# Patient Record
Sex: Male | Born: 1945
Health system: Southern US, Community
[De-identification: ages and names within clinical notes are randomized; demographics above are authoritative.]

## PROBLEM LIST (undated history)

## (undated) DIAGNOSIS — H35419 Lattice degeneration of retina, unspecified eye: Secondary | ICD-10-CM

## (undated) DIAGNOSIS — R001 Bradycardia, unspecified: Secondary | ICD-10-CM

## (undated) DIAGNOSIS — I8390 Asymptomatic varicose veins of unspecified lower extremity: Secondary | ICD-10-CM

## (undated) DIAGNOSIS — M5030 Other cervical disc degeneration, unspecified cervical region: Secondary | ICD-10-CM

## (undated) DIAGNOSIS — K635 Polyp of colon: Secondary | ICD-10-CM

## (undated) DIAGNOSIS — R1310 Dysphagia, unspecified: Secondary | ICD-10-CM

## (undated) DIAGNOSIS — M5106 Intervertebral disc disorders with myelopathy, lumbar region: Secondary | ICD-10-CM

## (undated) DIAGNOSIS — I83893 Varicose veins of bilateral lower extremities with other complications: Secondary | ICD-10-CM

## (undated) DIAGNOSIS — J449 Chronic obstructive pulmonary disease, unspecified: Secondary | ICD-10-CM

## (undated) DIAGNOSIS — J4489 Other specified chronic obstructive pulmonary disease: Secondary | ICD-10-CM

## (undated) DIAGNOSIS — R300 Dysuria: Secondary | ICD-10-CM

## (undated) DIAGNOSIS — F4310 Post-traumatic stress disorder, unspecified: Secondary | ICD-10-CM

## (undated) DIAGNOSIS — M19049 Primary osteoarthritis, unspecified hand: Secondary | ICD-10-CM

## (undated) DIAGNOSIS — E785 Hyperlipidemia, unspecified: Secondary | ICD-10-CM

## (undated) DIAGNOSIS — Z972 Presence of dental prosthetic device (complete) (partial): Secondary | ICD-10-CM

## (undated) DIAGNOSIS — D126 Benign neoplasm of colon, unspecified: Secondary | ICD-10-CM

## (undated) HISTORY — DX: Lattice degeneration of retina, unspecified eye: H35.419

## (undated) HISTORY — DX: Chronic obstructive pulmonary disease, unspecified: J44.9

## (undated) HISTORY — PX: TONSILLECTOMY: SUR1361

## (undated) HISTORY — DX: Benign neoplasm of colon, unspecified: D12.6

## (undated) HISTORY — DX: Polyp of colon: K63.5

## (undated) HISTORY — DX: Other specified chronic obstructive pulmonary disease: J44.89

## (undated) HISTORY — DX: Primary osteoarthritis, unspecified hand: M19.049

## (undated) HISTORY — PX: ESOPHAGOGASTRODUODENOSCOPY: SHX1529

## (undated) HISTORY — PX: CYSTOSCOPY: SUR368

## (undated) HISTORY — DX: Hyperlipidemia, unspecified: E78.5

## (undated) HISTORY — DX: Other cervical disc degeneration, unspecified cervical region: M50.30

## (undated) HISTORY — PX: EYE SURGERY: SHX253

## (undated) HISTORY — DX: Post-traumatic stress disorder, unspecified: F43.10

## (undated) HISTORY — DX: Asymptomatic varicose veins of unspecified lower extremity: I83.90

## (undated) HISTORY — DX: Bradycardia, unspecified: R00.1

---

## 2012-09-05 HISTORY — PX: COLONOSCOPY: SHX174

## 2013-05-21 ENCOUNTER — Ambulatory Visit: Payer: Self-pay | Admitting: Unknown Physician Specialty

## 2013-05-31 ENCOUNTER — Ambulatory Visit: Payer: Self-pay | Admitting: Unknown Physician Specialty

## 2013-06-03 LAB — PATHOLOGY REPORT

## 2013-09-05 HISTORY — PX: CATARACT EXTRACTION: SUR2

## 2014-03-31 ENCOUNTER — Ambulatory Visit: Payer: Self-pay | Admitting: Physician Assistant

## 2014-04-09 ENCOUNTER — Ambulatory Visit: Payer: Self-pay | Admitting: Internal Medicine

## 2014-04-15 ENCOUNTER — Encounter: Payer: Self-pay | Admitting: *Deleted

## 2014-04-17 ENCOUNTER — Ambulatory Visit (INDEPENDENT_AMBULATORY_CARE_PROVIDER_SITE_OTHER): Payer: Commercial Managed Care - HMO | Admitting: Cardiovascular Disease

## 2014-04-17 ENCOUNTER — Encounter: Payer: Self-pay | Admitting: Cardiovascular Disease

## 2014-04-17 ENCOUNTER — Encounter (INDEPENDENT_AMBULATORY_CARE_PROVIDER_SITE_OTHER): Payer: Self-pay

## 2014-04-17 VITALS — BP 124/76 | HR 64 | Ht 71.5 in | Wt 170.0 lb

## 2014-04-17 DIAGNOSIS — R0602 Shortness of breath: Secondary | ICD-10-CM | POA: Diagnosis not present

## 2014-04-17 DIAGNOSIS — I498 Other specified cardiac arrhythmias: Secondary | ICD-10-CM

## 2014-04-17 DIAGNOSIS — R001 Bradycardia, unspecified: Secondary | ICD-10-CM

## 2014-04-17 HISTORY — DX: Bradycardia, unspecified: R00.1

## 2014-04-17 NOTE — Patient Instructions (Signed)
Follow up as needed

## 2014-04-17 NOTE — Assessment & Plan Note (Signed)
The patient reports prolonged history of sinus bradycardia but he has been completely asymptomatic. I reviewed his home blood pressure heart rate readings. The lowest heart rate was 44 beats per minute and the highest was 65 beats per minute. Even when his heart rate was 44 beats per minute, he did not have any symptoms. Thus, there is no indication for pacemaker placement. He otherwise has no symptoms suggestive of angina or heart failure. Right arm numbness is not related to cardiac etiology. He can followup with me as needed if he develops cardiac symptoms.

## 2014-04-17 NOTE — Progress Notes (Signed)
Primary care physician: Dr. Army Melia  HPI  This is a pleasant 68 year old man who was referred for evaluation of bradycardia. He has no previous cardiac history and no significant disc factors for coronary artery disease other than age and gender. He reports prolonged history of asymptomatic bradycardia. Recently, he had right arm numbness and went to urgent care. He was suspected of having neuropathy. He denies any chest discomfort or left arm discomfort. No shortness of breath. He exercises regularly on a treadmill for about 35 minutes with no reported exertional symptoms. He had carotid Doppler done recently and was told that there was no significant disease. He reports having slow heartbeats throughout his life but he never had syncope. He denies dizziness, presyncope or exercise intolerance. He is not a smoker and has no family history of premature coronary artery disease or arrhythmia.  No Known Allergies   Current Outpatient Prescriptions on File Prior to Visit  Medication Sig Dispense Refill  . buPROPion (WELLBUTRIN) 100 MG tablet Take 100 mg by mouth 4 (four) times daily as needed.      . traZODone (DESYREL) 50 MG tablet Take 50 mg by mouth at bedtime.       No current facility-administered medications on file prior to visit.     Past Medical History  Diagnosis Date  . Posttraumatic stress disorder   . Degeneration of cervical intervertebral disc   . Lattice degeneration of peripheral retina   . Osteoarthrosis, unspecified whether generalized or localized, hand   . Benign neoplasm of colon   . Chronic airway obstruction, not elsewhere classified   . COPD (chronic obstructive pulmonary disease)      Past Surgical History  Procedure Laterality Date  . Tonsillectomy    . Colonoscopy    . Cystoscopy       Family History  Problem Relation Age of Onset  . Hypertension Mother      History   Social History  . Marital Status: Single    Spouse Name: N/A    Number of  Children: N/A  . Years of Education: N/A   Occupational History  . Not on file.   Social History Main Topics  . Smoking status: Never Smoker   . Smokeless tobacco: Not on file  . Alcohol Use: Yes     Comment: socially  . Drug Use: No  . Sexual Activity: Not on file   Other Topics Concern  . Not on file   Social History Narrative  . No narrative on file     ROS A 10 point review of system was performed. It is negative other than that mentioned in the history of present illness.   PHYSICAL EXAM   BP 124/76  Pulse 64  Ht 5' 11.5" (1.816 m)  Wt 170 lb (77.111 kg)  BMI 23.38 kg/m2 Constitutional: He is oriented to person, place, and time. He appears well-developed and well-nourished. No distress.  HENT: No nasal discharge.  Head: Normocephalic and atraumatic.  Eyes: Pupils are equal and round.  No discharge. Neck: Normal range of motion. Neck supple. No JVD present. No thyromegaly present.  Cardiovascular: Normal rate, regular rhythm, normal heart sounds. Exam reveals no gallop and no friction rub. No murmur heard.  Pulmonary/Chest: Effort normal and breath sounds normal. No stridor. No respiratory distress. He has no wheezes. He has no rales. He exhibits no tenderness.  Abdominal: Soft. Bowel sounds are normal. He exhibits no distension. There is no tenderness. There is no rebound and no guarding.  Musculoskeletal: Normal range of motion. He exhibits no edema and no tenderness.  Neurological: He is alert and oriented to person, place, and time. Coordination normal.  Skin: Skin is warm and dry. No rash noted. He is not diaphoretic. No erythema. No pallor.  Psychiatric: He has a normal mood and affect. His behavior is normal. Judgment and thought content normal.       EKG: Recent EKG showed sinus bradycardia with no significant AV block.   ASSESSMENT AND PLAN

## 2014-05-14 ENCOUNTER — Ambulatory Visit: Payer: Self-pay | Admitting: Ophthalmology

## 2014-09-05 HISTORY — PX: CATARACT EXTRACTION, BILATERAL: SHX1313

## 2014-10-23 ENCOUNTER — Ambulatory Visit: Payer: Self-pay | Admitting: Internal Medicine

## 2015-03-10 ENCOUNTER — Encounter: Payer: Self-pay | Admitting: Internal Medicine

## 2015-03-10 DIAGNOSIS — IMO0002 Reserved for concepts with insufficient information to code with codable children: Secondary | ICD-10-CM | POA: Insufficient documentation

## 2015-03-10 DIAGNOSIS — I8390 Asymptomatic varicose veins of unspecified lower extremity: Secondary | ICD-10-CM

## 2015-03-10 DIAGNOSIS — H35419 Lattice degeneration of retina, unspecified eye: Secondary | ICD-10-CM | POA: Insufficient documentation

## 2015-03-10 DIAGNOSIS — N138 Other obstructive and reflux uropathy: Secondary | ICD-10-CM | POA: Insufficient documentation

## 2015-03-10 DIAGNOSIS — N401 Enlarged prostate with lower urinary tract symptoms: Secondary | ICD-10-CM | POA: Insufficient documentation

## 2015-03-10 DIAGNOSIS — R300 Dysuria: Secondary | ICD-10-CM | POA: Insufficient documentation

## 2015-03-10 DIAGNOSIS — R001 Bradycardia, unspecified: Secondary | ICD-10-CM | POA: Insufficient documentation

## 2015-03-10 DIAGNOSIS — M25569 Pain in unspecified knee: Secondary | ICD-10-CM | POA: Insufficient documentation

## 2015-03-10 DIAGNOSIS — M19049 Primary osteoarthritis, unspecified hand: Secondary | ICD-10-CM | POA: Insufficient documentation

## 2015-03-10 DIAGNOSIS — F4310 Post-traumatic stress disorder, unspecified: Secondary | ICD-10-CM | POA: Insufficient documentation

## 2015-03-10 DIAGNOSIS — M5030 Other cervical disc degeneration, unspecified cervical region: Secondary | ICD-10-CM | POA: Insufficient documentation

## 2015-03-10 HISTORY — DX: Asymptomatic varicose veins of unspecified lower extremity: I83.90

## 2015-03-11 ENCOUNTER — Ambulatory Visit (INDEPENDENT_AMBULATORY_CARE_PROVIDER_SITE_OTHER): Payer: PPO | Admitting: Internal Medicine

## 2015-03-11 ENCOUNTER — Encounter: Payer: Self-pay | Admitting: Internal Medicine

## 2015-03-11 VITALS — BP 128/60 | HR 60 | Ht 71.5 in | Wt 174.6 lb

## 2015-03-11 DIAGNOSIS — H81393 Other peripheral vertigo, bilateral: Secondary | ICD-10-CM

## 2015-03-11 DIAGNOSIS — M5116 Intervertebral disc disorders with radiculopathy, lumbar region: Secondary | ICD-10-CM | POA: Insufficient documentation

## 2015-03-11 DIAGNOSIS — H8103 Meniere's disease, bilateral: Secondary | ICD-10-CM

## 2015-03-11 DIAGNOSIS — F17201 Nicotine dependence, unspecified, in remission: Secondary | ICD-10-CM | POA: Insufficient documentation

## 2015-03-11 NOTE — Progress Notes (Signed)
Date:  03/11/2015   Name:  Timothy Timothy   DOB:  1946-01-02   MRN:  [id number]   Chief Complaint: Dizziness Dizziness This is a new problem. The current episode started 1 to 4 weeks ago. The problem occurs intermittently (he has had no symptoms for the past three days). The problem has been waxing and waning. Associated symptoms include congestion. Pertinent negatives include no chest pain, coughing, fatigue, fever, headaches, swollen glands or urinary symptoms. The symptoms are aggravated by standing and twisting. He has tried drinking for the symptoms.  Back Pain This is a chronic problem. The current episode started more than 1 month ago. The problem occurs constantly. The problem is unchanged. The pain is present in the lumbar spine. The quality of the pain is described as aching and cramping. The pain radiates to the left thigh and left foot. Pertinent negatives include no chest pain, fever or headaches. Treatments tried: [facility]o at [facility] - MRI with disc disease.  Started on gabapentin without much improvement in discomfort.  Now discussing possible ESI.     Review of Systems:  Review of Systems  Constitutional: Negative for fever and fatigue.  HENT: Positive for congestion. Negative for ear pain, hearing loss, nosebleeds, rhinorrhea and sinus pressure.   Eyes: Negative for visual disturbance.  Respiratory: Negative for cough and shortness of breath.   Cardiovascular: Negative for chest pain and leg swelling.  Musculoskeletal: Positive for back pain.  Neurological: Positive for dizziness and light-headedness. Negative for headaches.    Patient Active Problem List   Diagnosis Date Noted  . Tobacco use disorder, moderate, in sustained remission 03/11/2015  . Gonalgia 03/10/2015  . Bradycardia, sinus 03/10/2015  . DDD (degenerative disc disease), cervical 03/10/2015  . Difficult or painful urination 03/10/2015  . COPD, mild 03/10/2015  . Degenerative arthritis of finger 03/10/2015   . Neurosis, posttraumatic 03/10/2015  . Lattice degeneration 03/10/2015  . Leg varices 03/10/2015  . Sinus bradycardia 04/17/2014    Prior to Admission medications   Medication Sig Start Date End Date Taking? Authorizing Provider  finasteride (PROSCAR) 5 MG tablet Take 5 mg by mouth daily.   Yes Historical Provider, MD  gabapentin (NEURONTIN) 300 MG capsule Take 300 mg by mouth 3 (three) times daily.   Yes Historical Provider, MD  traZODone (DESYREL) 50 MG tablet Take 50 mg by mouth at bedtime.   Yes Historical Provider, MD    No Known Allergies  Past Surgical History  Procedure Laterality Date  . Tonsillectomy    . Colonoscopy    . Cystoscopy    . Cataract extraction Right 2015  . Colonoscopy  2014    benign polyps    History  Substance Use Topics  . Smoking status: Former Research scientist (life sciences)  . Smokeless tobacco: Not on file  . Alcohol Use: 8.4 oz/week    14 Standard drinks or equivalent per week     Comment: socially     Medication list has been reviewed and updated.  Physical Examination:  Physical Exam  Constitutional: He is oriented to person, place, and time. He appears well-developed and well-nourished. No distress.  HENT:  Head: Normocephalic.  Right Ear: Tympanic membrane and ear canal normal.  Left Ear: Tympanic membrane and ear canal normal.  Nose: Nose normal. Right sinus exhibits no maxillary sinus tenderness and no frontal sinus tenderness. Left sinus exhibits no maxillary sinus tenderness and no frontal sinus tenderness.  Mouth/Throat: Uvula is midline and oropharynx is clear and moist. No posterior oropharyngeal  erythema.  Eyes: Conjunctivae are normal. Right eye exhibits nystagmus. Left eye exhibits nystagmus. Right pupil is round and reactive. Left pupil is round and reactive. Pupils are equal.  Very slight nystagmus to lateral gaze in both directions with mild vertigo.  Neck: Carotid bruit is not present. No thyromegaly present.  Cardiovascular: Normal rate,  regular rhythm and S1 normal.   Pulmonary/Chest: Effort normal and breath sounds normal.  Neurological: He is alert and oriented to person, place, and time. He has normal reflexes. No cranial nerve deficit or sensory deficit. Gait normal.    BP 128/60 mmHg  Pulse 60  Ht 5' 11.5" (1.816 m)  Wt 174 lb 9.6 oz (79.198 kg)  BMI 24.01 kg/m2  Assessment and Plan: 1. Lumbar disc herniation with radiculopathy Continue gabapentin but consider dose increase if no improvement Also consider ESI if symptoms persist  2. Labyrinthine vertigo with involvement of both inner ears Patient reassured - should resolve without treatment over the next few weeks If worsening, will refer to ENT   Halina Maidens, MD [facility]  03/11/2015

## 2015-03-11 NOTE — Patient Instructions (Signed)
Labyrinthitis (Inner Ear Inflammation) Your exam shows you have an inner ear disturbance or labyrinthitis. The cause of this condition is not known. But it may be due to a virus infection. The symptoms of labyrinthitis include vertigo or dizziness made worse by motion, nausea and vomiting. The onset of labyrinthitis may be very sudden. It usually lasts for a few days and then clears up over 1-2 weeks. The treatment of an inner ear disturbance includes bed rest and medications to reduce dizziness, nausea, and vomiting. You should stay away from alcohol, tranquilizers, caffeine, nicotine, or any medicine your doctor thinks may make your symptoms worse. Further testing may be needed to evaluate your hearing and balance system. Please see your doctor or go to the emergency room right away if you have:  Increasing vertigo, earache, loss of hearing, or ear drainage.  Headache, blurred vision, trouble walking, fainting, or fever.  Persistent vomiting, dehydration, or extreme weakness. Document Released: 08/22/2005 Document Revised: 11/14/2011 Document Reviewed: 02/07/2007 [facility] Patient Information 2015 [place], [place]. This information is not intended to replace advice given to you by your health care provider. Make sure you discuss any questions you have with your health care provider.

## 2015-05-28 ENCOUNTER — Ambulatory Visit (INDEPENDENT_AMBULATORY_CARE_PROVIDER_SITE_OTHER): Payer: PPO | Admitting: Internal Medicine

## 2015-05-28 ENCOUNTER — Encounter: Payer: Self-pay | Admitting: Internal Medicine

## 2015-05-28 VITALS — BP 110/60 | HR 64 | Ht 71.5 in | Wt 173.8 lb

## 2015-05-28 DIAGNOSIS — Z0000 Encounter for general adult medical examination without abnormal findings: Secondary | ICD-10-CM

## 2015-05-28 DIAGNOSIS — Z720 Tobacco use: Secondary | ICD-10-CM | POA: Diagnosis not present

## 2015-05-28 DIAGNOSIS — F4310 Post-traumatic stress disorder, unspecified: Secondary | ICD-10-CM

## 2015-05-28 DIAGNOSIS — M5116 Intervertebral disc disorders with radiculopathy, lumbar region: Secondary | ICD-10-CM

## 2015-05-28 DIAGNOSIS — F17201 Nicotine dependence, unspecified, in remission: Secondary | ICD-10-CM

## 2015-05-28 DIAGNOSIS — M25561 Pain in right knee: Secondary | ICD-10-CM | POA: Diagnosis not present

## 2015-05-28 DIAGNOSIS — Z23 Encounter for immunization: Secondary | ICD-10-CM

## 2015-05-28 NOTE — Patient Instructions (Addendum)
Pneumococcal Conjugate Vaccine: What You Need to Know Your doctor recommends that you, or your child, get a dose of PCV13 today. 1. Why get vaccinated? Pneumococcal conjugate vaccine (called PCV13 or Prevnar 13) is recommended to protect infants and toddlers, and some older children and adults with certain health conditions, from pneumococcal disease. Pneumococcal disease is caused by infection with Streptococcus pneumoniae bacteria. These bacteria can spread from person to person through close contact. Pneumococcal disease can lead to severe health problems, including pneumonia, blood infections, and meningitis. Meningitis is an infection of the covering of the brain. Pneumococcal meningitis is fairly rare (less than 1 case per 100,000 people each year), but it leads to other health problems, including deafness and brain damage. In children, it is fatal in about 1 case out of 10. Children younger than two are at higher risk for serious disease than older children. People with certain medical conditions, people over age 66, and cigarette smokers are also at higher risk. Before vaccine, pneumococcal infections caused many problems each year in the [place] in children younger than 5, including:  more than 700 cases of meningitis,  13,000 blood infections,  about 5 million ear infections, and  about 200 deaths. About 4,000 adults still die each year because of pneumococcal infections. Pneumococcal infections can be hard to treat because some strains are resistant to antibiotics. This makes prevention through vaccination even more important. 2. PCV13 vaccine There are more than 90 types of pneumococcal bacteria. PCV13 protects against 13 of them. These 13 strains cause most severe infections in children and about half of infections in adults.  PCV13 is routinely given to children at 2, 4, 6, and 2-73 months of age. Children in this age range are at greatest risk for serious diseases caused  by pneumococcal infection. PCV13 vaccine may also be recommended for some older children or adults. Your doctor can give you details. A second type of pneumococcal vaccine, called PPSV23, may also be given to some children and adults, including anyone over age 9. There is a separate Vaccine Information Statement for this vaccine. 3. Precautions  Anyone who has ever had a life-threatening allergic reaction to a dose of this vaccine, to an earlier pneumococcal vaccine called PCV7 (or Prevnar), or to any vaccine containing diphtheria toxoid (for example, DTaP), should not get PCV13. Anyone with a severe allergy to any component of PCV13 should not get the vaccine. Tell your doctor if the person being vaccinated has any severe allergies. If the person scheduled for vaccination is sick, your doctor might decide to reschedule the shot on another day. Your doctor can give you more information about any of these precautions. 4. What are the risks of PCV13 vaccine?  With any medicine, including vaccines, there is a chance of side effects. These are usually mild and go away on their own, but serious reactions are also possible. Reported problems associated with PCV13 vary by dose and age, but generally:  About half of children became drowsy after the shot, had a temporary loss of appetite, or had redness or tenderness where the shot was given.  About 1 out of 3 had swelling where the shot was given.  About 1 out of 3 had a mild fever, and about 1 in 20 had a higher fever (over 102.18F).  Up to about 8 out of 10 became fussy or irritable. Adults receiving the vaccine have reported redness, pain, and swelling where the shot was given. Mild fever, fatigue, headache, chills, or  muscle pain have also been reported. Life-threatening allergic reactions from any vaccine are very rare. 5. What if there is a serious reaction? What should I look for?  Look for anything that concerns you, such as signs of a  severe allergic reaction, very high fever, or behavior changes. Signs of a severe allergic reaction can include hives, swelling of the face and throat, difficulty breathing, a fast heartbeat, dizziness, and weakness. These would start a few minutes to a few hours after the vaccination. What should I do?  If you think it is a severe allergic reaction or other emergency that can't wait, call 9-1-1 or get the person to the nearest hospital. Otherwise, call your doctor.  Afterward, the reaction should be reported to the Vaccine Adverse Event Reporting System (VAERS). Your doctor might file this report, or you can do it yourself through the VAERS web site at www.vaers.SamedayNews.es, or by calling (916)761-0414. VAERS is only for reporting reactions. They do not give medical advice. 6. The National Vaccine Injury Compensation Program The [employer] Vaccine Injury Compensation Program (VICP) is a federal program that was created to compensate people who may have been injured by certain vaccines. Persons who believe they may have been injured by a vaccine can learn about the program and about filing a claim by calling (531)324-5737 or visiting the [place] website at GoldCloset.com.ee. 7. How can I learn more?  Ask your doctor.  Call your local or state health department.  Contact the Centers for Disease Control and Prevention (CDC):  Call 603 814 1704 (1-800-CDC-INFO) or  Visit CDC's website at http://hunter.com/ CDC PCV13 Vaccine VIS (Interim) (11/02/11) Document Released: 06/19/2006 Document Revised: 01/06/2014 Document Reviewed: 10/11/2013 [facility] Patient Information 2015 [place], [place]. This information is not intended to replace advice given to you by your health care provider. Make sure you discuss any questions you have with your health care provider.   Health Maintenance  Topic Date Due  . Hepatitis C Screening  1946/03/25  . COLONOSCOPY  07/26/1995  . PNA vac Low Risk Adult  (2 of 2 - PCV13) Done today  . INFLUENZA VACCINE  04/06/2015  . TETANUS/TDAP  09/06/2020  . ZOSTAVAX  Completed

## 2015-05-28 NOTE — Progress Notes (Signed)
Patient: Timothy Scott, Male    DOB: 07-29-1946, 69 y.o.   MRN: [id number] Visit Date: 05/28/2015  Today's Provider: Halina Maidens, MD   Chief Complaint  Patient presents with  . Medicare Wellness   Subjective:    Annual wellness visit Timothy Scott is a 69 y.o. male who presents today for his Subsequent Annual Wellness Visit. He feels fairly well. He reports exercising intermittently - gym 4 times per week. He reports he is sleeping well.   ----------------------------------------------------------- HPI patient has ongoing low back pain. He's been followed at the [facility] and is considering steroid injections. His only medication is naproxen 500 twice a day as needed Tinnitus - patient has noted ringing in both ears times a time along with some hearing loss. He denies any ear pain abnormal headaches or dizziness. He does some times have lightheadedness that he cannot relate any particular activity. Bradycardia - is a history of borderline bradycardia. Currently not taking any medications that would affect his heart rate. Other than mild lightheadedness at times he has not had any symptoms. Knee pain - patient has noted some ongoing right knee pain. It's been slightly progressive recently without any acute injury. He goes to the gym 4-5 times per week and rides a bike or walks on treadmill. He takes Naprosyn as needed for pain. Memory concerns - patient thinks he has some mild memory deficits at times. He consulted with his psychiatrist yesterday who is going to refer him for neuropsych testing. He does continue to take Desyrel for depression and posttraumatic stress disorder. Urinary frequency - followed by urology. Last year a cystoscopy showed that the bladder wall was normal (it had appeared thickened on an earlier colonoscopy). He he takes finasteride daily with good response.  Review of Systems  Constitutional: Negative for fever, chills and fatigue.  HENT: Positive for hearing  loss, postnasal drip and tinnitus. Negative for trouble swallowing and voice change.   Eyes: Negative for redness and visual disturbance.  Respiratory: Negative for cough, chest tightness and shortness of breath.   Cardiovascular: Negative for chest pain, palpitations and leg swelling.  Gastrointestinal: Negative for abdominal pain, diarrhea, constipation and blood in stool.  Genitourinary: Positive for frequency. Negative for dysuria and difficulty urinating.  Musculoskeletal: Positive for back pain. Negative for joint swelling and gait problem.  Neurological: Positive for light-headedness. Negative for dizziness, tremors, syncope, weakness and headaches.  Psychiatric/Behavioral: Negative for dysphoric mood and decreased concentration.    Social History   Social History  . Marital Status: Single    Spouse Name: N/A  . Number of Children: N/A  . Years of Education: N/A   Occupational History  . Not on file.   Social History Main Topics  . Smoking status: Former Research scientist (life sciences)  . Smokeless tobacco: Not on file  . Alcohol Use: 8.4 oz/week    14 Standard drinks or equivalent per week     Comment: socially  . Drug Use: No  . Sexual Activity: Not on file   Other Topics Concern  . Not on file   Social History Narrative    Patient Active Problem List   Diagnosis Date Noted  . Tobacco use disorder, moderate, in sustained remission 03/11/2015  . Lumbar disc herniation with radiculopathy 03/11/2015  . Gonalgia 03/10/2015  . DDD (degenerative disc disease), cervical 03/10/2015  . Difficult or painful urination 03/10/2015  . COPD, mild 03/10/2015  . Degenerative arthritis of finger 03/10/2015  . Neurosis, posttraumatic 03/10/2015  . Lattice degeneration  03/10/2015  . Leg varices 03/10/2015  . Sinus bradycardia 04/17/2014    Past Surgical History  Procedure Laterality Date  . Tonsillectomy    . Colonoscopy    . Cystoscopy    . Cataract extraction Right 2015  . Colonoscopy  2014     benign polyps    His family history includes Hypertension in his mother.    Previous Medications   FINASTERIDE (PROSCAR) 5 MG TABLET    Take 5 mg by mouth daily.   FLUTICASONE (FLONASE) 50 MCG/ACT NASAL SPRAY    Place 2 sprays into both nostrils daily.   MAGNESIUM OXIDE (MAG-OX) 400 MG TABLET    Take 400 mg by mouth daily.   NAPROXEN (NAPROSYN) 500 MG TABLET    Take 500 mg by mouth 2 (two) times daily with a meal.   TRAZODONE (DESYREL) 50 MG TABLET    Take 50 mg by mouth at bedtime.    Patient Care Team: Glean Hess, MD as PCP - General (Family Medicine) Wellington Hampshire, MD as Consulting Physician (Cardiology) Manya Silvas, MD (Gastroenterology) Hollice Espy, MD as Consulting Physician (Urology)     Objective:   Vitals: BP 110/60 mmHg  Pulse 64  Ht 5' 11.5" (1.816 m)  Wt 173 lb 12.8 oz (78.835 kg)  BMI 23.90 kg/m2  Physical Exam  Constitutional: He is oriented to person, place, and time. He appears well-developed and well-nourished. No distress.  HENT:  Head: Normocephalic and atraumatic.  Nose: Nose normal.  Mouth/Throat: Uvula is midline.  Excessive cerumen bilaterally  Eyes: Conjunctivae are normal. Right eye exhibits no discharge. Left eye exhibits no discharge. No scleral icterus.  Neck: Normal range of motion. Neck supple. Carotid bruit is not present.  Cardiovascular: Normal rate, regular rhythm, normal heart sounds and intact distal pulses.   Pulses:      Dorsalis pedis pulses are 2+ on the right side, and 2+ on the left side.       Posterior tibial pulses are 2+ on the right side, and 2+ on the left side.  Pulmonary/Chest: Effort normal and breath sounds normal. No respiratory distress. He has no wheezes.  Abdominal: Soft. Bowel sounds are normal. There is no hepatosplenomegaly. There is no tenderness. There is no rebound and no guarding.  Musculoskeletal: He exhibits no edema or tenderness.       Right knee: He exhibits decreased range of motion  and effusion. He exhibits no ecchymosis and no deformity.  Lymphadenopathy:    He has no cervical adenopathy.  Neurological: He is alert and oriented to person, place, and time. He has normal reflexes.  Skin: Skin is warm and dry. No rash noted.  Psychiatric: He has a normal mood and affect. His behavior is normal. Thought content normal. Cognition and memory are normal.  Nursing note and vitals reviewed.   Activities of Daily Living In your present state of health, do you have any difficulty performing the following activities: 03/11/2015  Hearing? Y  Vision? Y  Difficulty concentrating or making decisions? N  Walking or climbing stairs? N  Dressing or bathing? N  Doing errands, shopping? N    Fall Risk Assessment Fall Risk  03/11/2015  Falls in the past year? No     Patient reports there are safety devices in place in shower at home.   Depression Screen PHQ 2/9 Scores 03/11/2015  PHQ - 2 Score 0    Cognitive Testing - 6-CIT   Correct? Score   What year  is it? yes 0 Yes = 0    No = 4  What month is it? yes 0 Yes = 0    No = 3  Remember:     Pia Mau, [place], [place]     What time is it? yes 0 Yes = 0    No = 3  Count backwards from 20 to 1 yes 0 Correct = 0    1 error = 2   More than 1 error = 4  Say the months of the year in reverse. yes 0 Correct = 0    1 error = 2   More than 1 error = 4  What address did I ask you to remember? yes 0 Correct = 0  1 error = 2    2 error = 4    3 error = 6    4 error = 8    All wrong = 10       TOTAL SCORE  0/28   Interpretation:  Normal  Normal (0-7) Abnormal (8-28)        Assessment & Plan:     Annual Wellness Visit  Reviewed patient's Family Medical History Reviewed and updated list of patient's medical providers Assessment of cognitive impairment was done Assessed patient's functional ability Established a written schedule for health screening [place] Completed and Reviewed  Exercise  Activities and Dietary recommendations Goals    . Increase water intake     To avoid bradycardia and lightheadedness.       Immunization History  Administered Date(s) Administered  . Pneumococcal Polysaccharide-23 09/06/2010  . Tdap 09/06/2010  . Zoster 09/06/2010    Health Maintenance  Topic Date Due  . Hepatitis C Screening  12-13-1945  . COLONOSCOPY  07/26/1995  . PNA vac Low Risk Adult (2 of 2 - PCV13) 09/07/2011  . INFLUENZA VACCINE  04/06/2015  . TETANUS/TDAP  09/06/2020  . ZOSTAVAX  Completed     Discussed health benefits of physical activity, and encouraged him to engage in regular exercise appropriate for his age and condition.    ------------------------------------------------------------------------------------------------------------ 1. Annual physical exam Medicare annual wellness completed Pt encouraged to see ENT for tinnitus evaluation All blood work done through the [facility] or specialist office  2. Lumbar disc herniation with radiculopathy Follow-up with orthopedics; continue naproxen  3. Neurosis, posttraumatic Stable; neuropsych testing pending  4. Tobacco use disorder, moderate, in sustained remission Patient remains tobacco free  5. Right knee pain Avoid activities that directly strain the knee; continue naproxen and use ice after exercise; consult orthopedics if symptoms are worsening   Halina Maidens, MD [facility]  05/28/2015

## 2015-09-01 ENCOUNTER — Other Ambulatory Visit: Payer: PPO | Admitting: Urology

## 2015-09-02 ENCOUNTER — Other Ambulatory Visit: Payer: PPO | Admitting: Urology

## 2015-09-14 ENCOUNTER — Other Ambulatory Visit: Payer: PPO

## 2015-10-05 ENCOUNTER — Ambulatory Visit (INDEPENDENT_AMBULATORY_CARE_PROVIDER_SITE_OTHER): Payer: PPO | Admitting: Urology

## 2015-10-05 ENCOUNTER — Encounter: Payer: Self-pay | Admitting: Urology

## 2015-10-05 VITALS — BP 146/70 | HR 61 | Ht 71.5 in | Wt 177.0 lb

## 2015-10-05 DIAGNOSIS — N400 Enlarged prostate without lower urinary tract symptoms: Secondary | ICD-10-CM | POA: Diagnosis not present

## 2015-10-05 LAB — URINALYSIS, COMPLETE
BILIRUBIN UA: NEGATIVE
GLUCOSE, UA: NEGATIVE
Ketones, UA: NEGATIVE
NITRITE UA: NEGATIVE
PROTEIN UA: NEGATIVE
Specific Gravity, UA: <1.005 — ABNORMAL LOW (ref 1.005–1.030)
UUROB: 0.2 mg/dL (ref 0.2–1.0)
pH, UA: 5.5 (ref 5.0–7.5)

## 2015-10-05 LAB — MICROSCOPIC EXAMINATION

## 2015-10-05 LAB — BLADDER SCAN AMB NON-IMAGING: SCAN RESULT: 237

## 2015-10-05 NOTE — Progress Notes (Signed)
10/05/2015 11:30 AM   Timothy Scott 11/11/1945 [id number]  Referring provider: Glean Hess, MD [address] [place], [place] [address]  Chief Complaint  Patient presents with  . Benign Prostatic Hypertrophy    HPI: 70 year old white male who presents today for voiding symptoms. The patient was seen in March 2016 by Dr. Erlene Quan. At this point, the patient was started on finasteride in addition to his tamsulosin. His PVRs were 255. He was noted to have a weak stream with an obstructive pattern. The patient is followed by the [facility] as well.  This summer the patient was seen and recommended that his tamsulosin be stopped. The patient noted significant worsening of his symptoms at that point, and he has since restarted it. He takes 0.4 milligrams daily. At this point, the patient has a great stream, he gets up once or twice at night. Does not feel as if he empties his bladder completely. He has a little postvoid dribbling. He denies frequency or urgency. He does not have any incontinence. He has not been treated for an infection. He denies any dysuria.  PVR 236 mL's     PMH: Past Medical History  Diagnosis Date  . Posttraumatic stress disorder   . Degeneration of cervical intervertebral disc   . Lattice degeneration of peripheral retina   . Osteoarthrosis, unspecified whether generalized or localized, hand   . Benign neoplasm of colon   . Chronic airway obstruction, not elsewhere classified   . COPD (chronic obstructive pulmonary disease) ([place])   . Leg varices 03/10/2015  . Sinus bradycardia 04/17/2014    Surgical History: Past Surgical History  Procedure Laterality Date  . Tonsillectomy    . Colonoscopy    . Cystoscopy    . Cataract extraction Right 2015  . Colonoscopy  2014    benign polyps    Home Medications:    Medication List       This list is accurate as of: 10/05/15 11:30 AM.  Always use your most recent med list.               CALCIUM 1200  PO  Take 1 mg by mouth.     finasteride 5 MG tablet  Commonly known as:  PROSCAR  Take 5 mg by mouth daily.     fluticasone 50 MCG/ACT nasal spray  Commonly known as:  FLONASE  Place 2 sprays into both nostrils daily.     magnesium oxide 400 MG tablet  Commonly known as:  MAG-OX  Take 400 mg by mouth daily.     naproxen 500 MG tablet  Commonly known as:  NAPROSYN  Take 500 mg by mouth 2 (two) times daily with a meal.     tamsulosin 0.4 MG Caps capsule  Commonly known as:  FLOMAX  Take 0.4 mg by mouth.     traZODone 50 MG tablet  Commonly known as:  DESYREL  Take 50 mg by mouth at bedtime.        Allergies: No Known Allergies  Family History: Family History  Problem Relation Age of Onset  . Hypertension Mother   . Prostate cancer Neg Hx     Social History:  reports that he has quit smoking. He does not have any smokeless tobacco history on file. He reports that he drinks about 8.4 oz of alcohol per week. He reports that he does not use illicit drugs.  ROS: UROLOGY Frequent Urination?: Yes Hard to postpone urination?: Yes Burning/pain with  urination?: No Get up at night to urinate?: Yes Leakage of urine?: No Urine stream starts and stops?: No Trouble starting stream?: No Do you have to strain to urinate?: No Blood in urine?: No Urinary tract infection?: No Sexually transmitted disease?: No Injury to kidneys or bladder?: No Painful intercourse?: No Weak stream?: No Erection problems?: No Penile pain?: No  Gastrointestinal Nausea?: No Vomiting?: No Indigestion/heartburn?: No Diarrhea?: No Constipation?: No  Constitutional Fever: No Night sweats?: No Weight loss?: No Fatigue?: No  Skin Skin rash/lesions?: No Itching?: No  Eyes Blurred vision?: No Double vision?: No  Ears/Nose/Throat Sore throat?: Yes Sinus problems?: Yes  Hematologic/Lymphatic Swollen glands?: No Easy bruising?: No  Cardiovascular Leg swelling?: No Chest pain?:  No  Respiratory Cough?: No Shortness of breath?: No  Endocrine Excessive thirst?: No  Musculoskeletal Back pain?: Yes Joint pain?: Yes  Neurological Headaches?: No Dizziness?: No  Psychologic Depression?: No Anxiety?: No  Physical Exam: BP 146/70 mmHg  Pulse 61  Ht 5' 11.5" (1.816 m)  Wt 177 lb (80.287 kg)  BMI 24.35 kg/m2  Constitutional:  Alert and oriented, No acute distress. HEENT: [place] AT, moist mucus membranes.  Trachea midline, no masses. Cardiovascular: No clubbing, cyanosis, or edema. Respiratory: Normal respiratory effort, no increased work of breathing. GI: Abdomen is soft, nontender, nondistended, no abdominal masses GU: No CVA tenderness Prostate is enlarged, plus 2 in size, symmetric, no nodules Skin: No rashes, bruises or suspicious lesions. Lymph: No cervical or inguinal adenopathy. Neurologic: Grossly intact, no focal deficits, moving all 4 extremities. Psychiatric: Normal mood and affect.  Laboratory Data: No results found for: WBC, HGB, HCT, MCV, PLT  No results found for: CREATININE  No results found for: PSA  No results found for: TESTOSTERONE  No results found for: HGBA1C  Urinalysis No results found for: COLORURINE, APPEARANCEUR, LABSPEC, PHURINE, GLUCOSEU, HGBUR, BILIRUBINUR, KETONESUR, PROTEINUR, UROBILINOGEN, NITRITE, LEUKOCYTESUR  Pertinent Imaging:   Assessment & Plan:  The patient has obstructive voiding symptoms secondary to an enlarged prostate. His PSA and rectal exam are reassuring for prostate cancer. His last PSA on 07/2015 was 0.29 (on finasteride). He does not empty his bladder completely. However, at this point it does not appear to be a significant problem with him. I recommended that the patient continue with finasteride and tamsulosin as prescribed. The patient will then follow up with [place] on a wall basis. At this point, he needs no additional interventions.  1. BPH (benign prostatic hyperplasia)  - Urinalysis,  Complete - Bladder Scan (Post Void Residual) in office   Return in about 1 year (around 10/04/2016).  Ardis Hughs, [facility] [address] [place], [place] [address] [phone]

## 2015-10-05 NOTE — Progress Notes (Signed)
Bladder Scan Patient  void: 237 ml Performed By: Larna Daughters

## 2015-10-15 DIAGNOSIS — H2512 Age-related nuclear cataract, left eye: Secondary | ICD-10-CM | POA: Diagnosis not present

## 2015-10-16 DIAGNOSIS — H2512 Age-related nuclear cataract, left eye: Secondary | ICD-10-CM | POA: Diagnosis not present

## 2015-10-19 ENCOUNTER — Encounter: Payer: Self-pay | Admitting: *Deleted

## 2015-10-19 NOTE — Discharge Instructions (Signed)

## 2015-10-21 ENCOUNTER — Encounter
Admission: RE | Disposition: A | Discharge status: Home / Self Care | Payer: Self-pay | Source: Ambulatory Visit | Attending: Ophthalmology

## 2015-10-21 ENCOUNTER — Ambulatory Visit: Payer: PPO | Admitting: Anesthesiology

## 2015-10-21 ENCOUNTER — Ambulatory Visit
Admission: RE | Admit: 2015-10-21 | Discharge: 2015-10-21 | Disposition: A | Discharge status: Home / Self Care | Payer: PPO | Source: Ambulatory Visit | Attending: Ophthalmology | Admitting: Ophthalmology

## 2015-10-21 DIAGNOSIS — K219 Gastro-esophageal reflux disease without esophagitis: Secondary | ICD-10-CM | POA: Diagnosis not present

## 2015-10-21 DIAGNOSIS — N400 Enlarged prostate without lower urinary tract symptoms: Secondary | ICD-10-CM | POA: Insufficient documentation

## 2015-10-21 DIAGNOSIS — F4310 Post-traumatic stress disorder, unspecified: Secondary | ICD-10-CM | POA: Diagnosis not present

## 2015-10-21 DIAGNOSIS — Z791 Long term (current) use of non-steroidal anti-inflammatories (NSAID): Secondary | ICD-10-CM | POA: Diagnosis not present

## 2015-10-21 DIAGNOSIS — Z9849 Cataract extraction status, unspecified eye: Secondary | ICD-10-CM | POA: Insufficient documentation

## 2015-10-21 DIAGNOSIS — J439 Emphysema, unspecified: Secondary | ICD-10-CM | POA: Diagnosis not present

## 2015-10-21 DIAGNOSIS — Z87891 Personal history of nicotine dependence: Secondary | ICD-10-CM | POA: Diagnosis not present

## 2015-10-21 DIAGNOSIS — K5790 Diverticulosis of intestine, part unspecified, without perforation or abscess without bleeding: Secondary | ICD-10-CM | POA: Diagnosis not present

## 2015-10-21 DIAGNOSIS — Z79899 Other long term (current) drug therapy: Secondary | ICD-10-CM | POA: Diagnosis not present

## 2015-10-21 DIAGNOSIS — H269 Unspecified cataract: Secondary | ICD-10-CM | POA: Diagnosis not present

## 2015-10-21 DIAGNOSIS — Z79891 Long term (current) use of opiate analgesic: Secondary | ICD-10-CM | POA: Insufficient documentation

## 2015-10-21 DIAGNOSIS — M1991 Primary osteoarthritis, unspecified site: Secondary | ICD-10-CM | POA: Insufficient documentation

## 2015-10-21 DIAGNOSIS — H2512 Age-related nuclear cataract, left eye: Secondary | ICD-10-CM | POA: Insufficient documentation

## 2015-10-21 DIAGNOSIS — Z9889 Other specified postprocedural states: Secondary | ICD-10-CM | POA: Insufficient documentation

## 2015-10-21 HISTORY — PX: CATARACT EXTRACTION W/PHACO: SHX586

## 2015-10-21 HISTORY — DX: Presence of dental prosthetic device (complete) (partial): Z97.2

## 2015-10-21 SURGERY — PHACOEMULSIFICATION, CATARACT, WITH IOL INSERTION
Anesthesia: Monitor Anesthesia Care | Laterality: Left | Wound class: Clean

## 2015-10-21 MED ORDER — TIMOLOL MALEATE 0.5 % OP SOLN
OPHTHALMIC | Status: DC | PRN
  Administered 2015-10-21: 1 [drp]

## 2015-10-21 MED ORDER — MIDAZOLAM HCL 2 MG/2ML IJ SOLN
INTRAMUSCULAR | Status: DC | PRN
  Administered 2015-10-21: 2 mg via INTRAVENOUS

## 2015-10-21 MED ORDER — CEFUROXIME OPHTHALMIC INJECTION 1 MG/0.1 ML
INJECTION | OPHTHALMIC | Status: DC | PRN
  Administered 2015-10-21: 0.1 mL via INTRACAMERAL

## 2015-10-21 MED ORDER — ARMC OPHTHALMIC DILATING GEL
1.0000 "application " | OPHTHALMIC | Status: DC | PRN
  Administered 2015-10-21 (×2): 1 via OPHTHALMIC

## 2015-10-21 MED ORDER — EPINEPHRINE HCL 1 MG/ML IJ SOLN
INTRAOCULAR | Status: DC | PRN
  Administered 2015-10-21: 73 mL via OPHTHALMIC

## 2015-10-21 MED ORDER — BRIMONIDINE TARTRATE 0.2 % OP SOLN
OPHTHALMIC | Status: DC | PRN
  Administered 2015-10-21: 1 [drp]

## 2015-10-21 MED ORDER — TETRACAINE HCL 0.5 % OP SOLN
1.0000 [drp] | OPHTHALMIC | Status: DC | PRN
  Administered 2015-10-21: 1 [drp] via OPHTHALMIC

## 2015-10-21 MED ORDER — NA HYALUR & NA CHOND-NA HYALUR 0.4-0.35 ML IO KIT
PACK | INTRAOCULAR | Status: DC | PRN
  Administered 2015-10-21: 1 mL via INTRAOCULAR

## 2015-10-21 MED ORDER — FENTANYL CITRATE (PF) 100 MCG/2ML IJ SOLN
INTRAMUSCULAR | Status: DC | PRN
  Administered 2015-10-21: 100 ug via INTRAVENOUS

## 2015-10-21 MED ORDER — POVIDONE-IODINE 5 % OP SOLN
1.0000 "application " | OPHTHALMIC | Status: DC | PRN
  Administered 2015-10-21: 1 via OPHTHALMIC

## 2015-10-21 MED ORDER — LACTATED RINGERS IV SOLN: INTRAVENOUS | Status: DC

## 2015-10-21 MED ORDER — ACETAMINOPHEN 160 MG/5ML PO SOLN: 325.0000 mg | ORAL | Status: DC | PRN

## 2015-10-21 MED ORDER — ACETAMINOPHEN 325 MG PO TABS: 325.0000 mg | ORAL_TABLET | ORAL | Status: DC | PRN

## 2015-10-21 SURGICAL SUPPLY — 27 items
CANNULA ANT/CHMB 27GA (MISCELLANEOUS) ×3 IMPLANT
CARTRIDGE ABBOTT (MISCELLANEOUS) ×3 IMPLANT
GLOVE SURG LX 7.5 STRW (GLOVE) ×2
GLOVE SURG LX STRL 7.5 STRW (GLOVE) ×1 IMPLANT
GLOVE SURG TRIUMPH 8.0 PF LTX (GLOVE) ×3 IMPLANT
GOWN STRL REUS W/ TWL LRG LVL3 (GOWN DISPOSABLE) ×2 IMPLANT
GOWN STRL REUS W/TWL LRG LVL3 (GOWN DISPOSABLE) ×4
LENS IOL TECNIS 15.5 (Intraocular Lens) ×3 IMPLANT
LENS IOL TECNIS MONO 1P 15.5 (Intraocular Lens) ×1 IMPLANT
MARKER SKIN DUAL TIP RULER LAB (MISCELLANEOUS) ×3 IMPLANT
NDL RETROBULBAR .5 NSTRL (NEEDLE) IMPLANT
NEEDLE FILTER BLUNT 18X 1/2SAF (NEEDLE) ×2
NEEDLE FILTER BLUNT 18X1 1/2 (NEEDLE) ×1 IMPLANT
PACK CATARACT BRASINGTON (MISCELLANEOUS) ×3 IMPLANT
PACK EYE AFTER SURG (MISCELLANEOUS) ×3 IMPLANT
PACK OPTHALMIC (MISCELLANEOUS) ×3 IMPLANT
RING MALYGIN 7.0 (MISCELLANEOUS) IMPLANT
SUT ETHILON 10-0 CS-B-6CS-B-6 (SUTURE)
SUT VICRYL  9 0 (SUTURE)
SUT VICRYL 9 0 (SUTURE) IMPLANT
SUTURE EHLN 10-0 CS-B-6CS-B-6 (SUTURE) IMPLANT
SYR 3ML LL SCALE MARK (SYRINGE) ×3 IMPLANT
SYR 5ML LL (SYRINGE) IMPLANT
SYR TB 1ML LUER SLIP (SYRINGE) ×3 IMPLANT
WATER STERILE IRR 250ML POUR (IV SOLUTION) ×3 IMPLANT
WATER STERILE IRR 500ML POUR (IV SOLUTION) IMPLANT
WIPE NON LINTING 3.25X3.25 (MISCELLANEOUS) ×3 IMPLANT

## 2015-10-21 NOTE — Anesthesia Postprocedure Evaluation (Signed)
Anesthesia Post Note  Patient: Timothy Scott  Procedure(s) Performed: Procedure(s) (LRB): CATARACT EXTRACTION PHACO AND INTRAOCULAR LENS PLACEMENT (IOC) (Left)  Patient location during evaluation: PACU Anesthesia Type: MAC Level of consciousness: awake and alert and oriented Pain management: satisfactory to patient Vital Signs Assessment: post-procedure vital signs reviewed and stable Respiratory status: spontaneous breathing, nonlabored ventilation and respiratory function stable Cardiovascular status: blood pressure returned to baseline and stable Postop Assessment: Adequate PO intake and No signs of nausea or vomiting Anesthetic complications: no    Raliegh Ip

## 2015-10-21 NOTE — Anesthesia Procedure Notes (Signed)
Procedure Name: MAC Performed by: Rommel Hogston Pre-anesthesia Checklist: Patient identified, Emergency Drugs available, Suction available, Timeout performed and Patient being monitored Patient Re-evaluated:Patient Re-evaluated prior to inductionOxygen Delivery Method: Nasal cannula Placement Confirmation: positive ETCO2       

## 2015-10-21 NOTE — Op Note (Signed)
OPERATIVE NOTE  Timothy Scott [id number] 10/21/2015   PREOPERATIVE DIAGNOSIS:  Nuclear sclerotic cataract left eye. H25.12   POSTOPERATIVE DIAGNOSIS:    Nuclear sclerotic cataract left eye.     PROCEDURE:  Phacoemusification with posterior chamber intraocular lens placement of the left eye   LENS:   Implant Name Type Inv. Item Serial No. Manufacturer Lot No. LRB No. Used  LENS IMPL INTRAOC [id number] 15.5 - [id number] Intraocular Lens LENS IMPL INTRAOC ZCB00 15.5 [id number]   Left 1        ULTRASOUND TIME: 14  % of 0 minutes 53 seconds, CDE 7.6  SURGEON:  Wyonia Hough, MD   ANESTHESIA:  Topical with tetracaine drops and 2% Xylocaine jelly.   COMPLICATIONS:  None.   DESCRIPTION OF PROCEDURE:  The patient was identified in the holding room and transported to the operating room and placed in the supine position under the operating microscope.  The left eye was identified as the operative eye and it was prepped and draped in the usual sterile ophthalmic fashion.   A 1 millimeter clear-corneal paracentesis was made at the 1:30 position.  The anterior chamber was filled with Viscoat viscoelastic.  A 2.4 millimeter keratome was used to make a near-clear corneal incision at the 10:30 position.  .  A curvilinear capsulorrhexis was made with a cystotome and capsulorrhexis forceps.  Balanced salt solution was used to hydrodissect and hydrodelineate the nucleus.   Phacoemulsification was then used in stop and chop fashion to remove the lens nucleus and epinucleus.  The remaining cortex was then removed using the irrigation and aspiration handpiece. Provisc was then placed into the capsular bag to distend it for lens placement.  A lens was then injected into the capsular bag.  The remaining viscoelastic was aspirated.   Wounds were hydrated with balanced salt solution.  The anterior chamber was inflated to a physiologic pressure with balanced salt solution.  No wound leaks were noted.  Cefuroxime 0.1 ml of a 10mg /ml solution was injected into the anterior chamber for a dose of 1 mg of intracameral antibiotic at the completion of the case.   Timolol and Brimonidine drops were applied to the eye.  The patient was taken to the recovery room in stable condition without complications of anesthesia or surgery.  Timothy Scott 10/21/2015, 12:00 PM

## 2015-10-21 NOTE — H&P (Signed)
  The History and Physical notes are on paper, have been signed, and are to be scanned. The patient remains stable and unchanged from the H&P.   Previous H&P reviewed, patient examined, and there are no changes.  Timothy Scott 10/21/2015 10:28 AM

## 2015-10-21 NOTE — Discharge Summary (Signed)
Discharge instructions given to patient's wife with verbalized understanding.

## 2015-10-21 NOTE — Anesthesia Preprocedure Evaluation (Signed)
Anesthesia Evaluation  Patient identified by MRN, date of birth, ID band  Reviewed: Allergy & Precautions, H&P , NPO status , Patient's Chart, lab work & pertinent test results  Airway Mallampati: II  TM Distance: >3 FB Neck ROM: full    Dental  (+) Upper Dentures, Lower Dentures   Pulmonary COPD, former smoker,    Pulmonary exam normal        Cardiovascular + Peripheral Vascular Disease   Rhythm:regular Rate:Normal     Neuro/Psych    GI/Hepatic   Endo/Other    Renal/GU      Musculoskeletal   Abdominal   Peds  Hematology   Anesthesia Other Findings   Reproductive/Obstetrics                             Anesthesia Physical Anesthesia Plan  ASA: II  Anesthesia Plan: MAC   Post-op Pain Management:    Induction:   Airway Management Planned:   Additional Equipment:   Intra-op Plan:   Post-operative Plan:   Informed Consent: I have reviewed the patients History and Physical, chart, labs and discussed the procedure including the risks, benefits and alternatives for the proposed anesthesia with the patient or authorized representative who has indicated his/her understanding and acceptance.     Plan Discussed with: CRNA  Anesthesia Plan Comments:         Anesthesia Quick Evaluation

## 2015-10-21 NOTE — Transfer of Care (Signed)
Immediate Anesthesia Transfer of Care Note  Patient: Timothy Scott  Procedure(s) Performed: Procedure(s): CATARACT EXTRACTION PHACO AND INTRAOCULAR LENS PLACEMENT (IOC) (Left)  Patient Location: PACU  Anesthesia Type: MAC  Level of Consciousness: awake, alert  and patient cooperative  Airway and Oxygen Therapy: Patient Spontanous Breathing and Patient connected to supplemental oxygen  Post-op Assessment: Post-op Vital signs reviewed, Patient's Cardiovascular Status Stable, Respiratory Function Stable, Patent Airway and No signs of Nausea or vomiting  Post-op Vital Signs: Reviewed and stable  Complications: No apparent anesthesia complications

## 2015-10-22 ENCOUNTER — Encounter: Payer: Self-pay | Admitting: Ophthalmology

## 2015-10-30 DIAGNOSIS — J0100 Acute maxillary sinusitis, unspecified: Secondary | ICD-10-CM | POA: Diagnosis not present

## 2015-10-30 DIAGNOSIS — R070 Pain in throat: Secondary | ICD-10-CM | POA: Diagnosis not present

## 2015-11-18 DIAGNOSIS — J019 Acute sinusitis, unspecified: Secondary | ICD-10-CM | POA: Diagnosis not present

## 2015-11-18 DIAGNOSIS — J301 Allergic rhinitis due to pollen: Secondary | ICD-10-CM | POA: Diagnosis not present

## 2015-12-24 DIAGNOSIS — R430 Anosmia: Secondary | ICD-10-CM | POA: Diagnosis not present

## 2015-12-24 DIAGNOSIS — R070 Pain in throat: Secondary | ICD-10-CM | POA: Diagnosis not present

## 2015-12-24 DIAGNOSIS — Z87891 Personal history of nicotine dependence: Secondary | ICD-10-CM | POA: Diagnosis not present

## 2015-12-24 DIAGNOSIS — K219 Gastro-esophageal reflux disease without esophagitis: Secondary | ICD-10-CM | POA: Diagnosis not present

## 2015-12-24 DIAGNOSIS — J301 Allergic rhinitis due to pollen: Secondary | ICD-10-CM | POA: Diagnosis not present

## 2016-01-08 ENCOUNTER — Ambulatory Visit (INDEPENDENT_AMBULATORY_CARE_PROVIDER_SITE_OTHER): Payer: PPO | Admitting: Internal Medicine

## 2016-01-08 ENCOUNTER — Encounter: Payer: Self-pay | Admitting: Internal Medicine

## 2016-01-08 VITALS — BP 98/60 | HR 57 | Temp 97.6°F | Resp 16 | Ht 71.5 in | Wt 174.0 lb

## 2016-01-08 DIAGNOSIS — R1310 Dysphagia, unspecified: Secondary | ICD-10-CM

## 2016-01-08 MED ORDER — FLUCONAZOLE 100 MG PO TABS: 100.0000 mg | ORAL_TABLET | Freq: Once | ORAL | Status: DC

## 2016-01-08 NOTE — Progress Notes (Signed)
Date:  01/08/2016   Name:  Timothy Scott   DOB:  05-12-1946   MRN:  [id number]   Chief Complaint: Sore Throat and Fatigue Has seen ENT and has done 2 rounds of antibiotic and still having pain in throat and on tongue for months. They treated also for post nasal drip. The latest DX is Acid Reflux and he has been taking Rx for weeks with no change. Would like PCP opinion.  He has been seen by ENT three times - first treated with antibiotics for sinus infection and sore throat.  His symptoms persisted and he was treated with another antibiotic and Dukes Magic Mouthwash.  He continues to have sore throat but sinus symptoms have improved.  He is now on PPI for possible GERD.  His throat is not improved. He does not think that he has any reflux. ENT performed a laryngoscopy but no other imaging or diagnostic tests. He does have some upper esophagus dysphagia at times.  He has never reguritated.  His discomfort is at the base of his tongue on his anterior neck - not posterior.  He denies fever or chills or sweats.  He does have intermittent days of fatigue. He had some ulcers on the side of his tongue on the right.  They seemed to have healed with the Duke's mouthwash.   Review of Systems  Constitutional: Positive for chills and fatigue. Negative for fever and unexpected weight change.  HENT: Positive for postnasal drip, sore throat and trouble swallowing. Negative for ear pain, facial swelling, hearing loss, sinus pressure and voice change.   Eyes: Negative for visual disturbance.  Respiratory: Negative for choking, chest tightness, shortness of breath and wheezing.   Cardiovascular: Negative for chest pain and palpitations.  Allergic/Immunologic: Positive for environmental allergies.    Patient Active Problem List   Diagnosis Date Noted  . Tobacco use disorder, moderate, in sustained remission 03/11/2015  . Lumbar disc herniation with radiculopathy 03/11/2015  . Gonalgia 03/10/2015  .  DDD (degenerative disc disease), cervical 03/10/2015  . Difficult or painful urination 03/10/2015  . COPD, mild ([place]) 03/10/2015  . Degenerative arthritis of finger 03/10/2015  . Neurosis, posttraumatic 03/10/2015  . Lattice degeneration 03/10/2015  . Leg varices 03/10/2015  . Sinus bradycardia 04/17/2014    Prior to Admission medications   Medication Sig Start Date End Date Taking? Authorizing Provider  azelastine (ASTELIN) 0.1 % nasal spray  11/18/15  Yes Historical Provider, MD  Calcium Carbonate-Vit D-Min (CALCIUM 1200 PO) Take 1 mg by mouth.   Yes Historical Provider, MD  finasteride (PROSCAR) 5 MG tablet Take 5 mg by mouth daily.   Yes Historical Provider, MD  fluticasone (FLONASE) 50 MCG/ACT nasal spray Place 2 sprays into both nostrils daily.   Yes Historical Provider, MD  magnesium oxide (MAG-OX) 400 MG tablet Take 400 mg by mouth daily.   Yes Historical Provider, MD  naproxen (NAPROSYN) 500 MG tablet Take 500 mg by mouth 2 (two) times daily with a meal.   Yes Historical Provider, MD  Probiotic Product (PROBIOTIC DAILY PO) Take by mouth.   Yes Historical Provider, MD  Saw Palmetto 450 MG CAPS Take by mouth daily.   Yes Historical Provider, MD  tamsulosin (FLOMAX) 0.4 MG CAPS capsule Take 0.4 mg by mouth.   Yes Historical Provider, MD  traZODone (DESYREL) 50 MG tablet Take 50 mg by mouth at bedtime.   Yes Historical Provider, MD  Turmeric 500 MG TABS Take by mouth daily.  Yes Historical Provider, MD    No Known Allergies  Past Surgical History  Procedure Laterality Date  . Tonsillectomy    . Colonoscopy    . Cystoscopy    . Cataract extraction Right 2015  . Colonoscopy  2014    benign polyps  . Cataract extraction w/phaco Left 10/21/2015    Procedure: CATARACT EXTRACTION PHACO AND INTRAOCULAR LENS PLACEMENT (IOC);  Surgeon: Leandrew Koyanagi, MD;  Location: [place];  Service: Ophthalmology;  Laterality: Left;    Social History  Substance Use Topics  .  Smoking status: Former Research scientist (life sciences)  . Smokeless tobacco: None     Comment: quit 25-30 yrs ago  . Alcohol Use: 8.4 oz/week    14 Standard drinks or equivalent per week     Comment: socially    Medication list has been reviewed and updated.   Physical Exam  Constitutional: He appears well-developed and well-nourished. No distress.  HENT:  Nose: Right sinus exhibits no maxillary sinus tenderness and no frontal sinus tenderness. Left sinus exhibits no maxillary sinus tenderness and no frontal sinus tenderness.  Mouth/Throat: Uvula is midline and oropharynx is clear and moist. No oropharyngeal exudate, posterior oropharyngeal edema or posterior oropharyngeal erythema.  Eyes: Pupils are equal, round, and reactive to light.  Neck: Normal range of motion and phonation normal. Neck supple. Carotid bruit is not present. No thyromegaly present.    Area of tenderness just under the angle of the lower jaw anterior neck.  No mass noted.   Cardiovascular: Normal rate, regular rhythm and normal heart sounds.   Pulmonary/Chest: Effort normal and breath sounds normal. He has no wheezes. He has no rales.  Lymphadenopathy:    He has no cervical adenopathy.    BP 98/60 mmHg  Pulse 57  Temp(Src) 97.6 F (36.4 C) (Oral)  Resp 16  Ht 5' 11.5" (1.816 m)  Wt 174 lb (78.926 kg)  BMI 23.93 kg/m2  SpO2 97%  Assessment and Plan: 1. Dysphagia Continue flonase; begin allegra or claritin Will treat with fluconazole for possible thrush Can stop PPI if desired Follow up with ENT - DG Esophagus; Future - fluconazole (DIFLUCAN) 100 MG tablet; Take 1 tablet (100 mg total) by mouth once.  Dispense: 7 tablet; Refill: 0   Halina Maidens, MD [facility]  01/08/2016

## 2016-01-12 ENCOUNTER — Ambulatory Visit
Admission: RE | Admit: 2016-01-12 | Discharge: 2016-01-12 | Disposition: A | Discharge status: Home / Self Care | Payer: PPO | Source: Ambulatory Visit | Attending: Internal Medicine | Admitting: Internal Medicine

## 2016-01-12 DIAGNOSIS — R1310 Dysphagia, unspecified: Secondary | ICD-10-CM | POA: Diagnosis not present

## 2016-01-12 DIAGNOSIS — J029 Acute pharyngitis, unspecified: Secondary | ICD-10-CM | POA: Diagnosis not present

## 2016-01-19 ENCOUNTER — Other Ambulatory Visit: Payer: Self-pay | Admitting: Internal Medicine

## 2016-01-19 DIAGNOSIS — R1310 Dysphagia, unspecified: Secondary | ICD-10-CM

## 2016-01-29 ENCOUNTER — Ambulatory Visit
Admission: RE | Admit: 2016-01-29 | Discharge: 2016-01-29 | Disposition: A | Discharge status: Home / Self Care | Payer: PPO | Source: Ambulatory Visit | Attending: Internal Medicine | Admitting: Internal Medicine

## 2016-01-29 DIAGNOSIS — R1310 Dysphagia, unspecified: Secondary | ICD-10-CM | POA: Diagnosis not present

## 2016-01-29 DIAGNOSIS — J029 Acute pharyngitis, unspecified: Secondary | ICD-10-CM | POA: Diagnosis not present

## 2016-01-29 LAB — POCT I-STAT CREATININE: CREATININE: 0.8 mg/dL (ref 0.61–1.24)

## 2016-01-29 MED ORDER — IOPAMIDOL (ISOVUE-300) INJECTION 61%
75.0000 mL | Freq: Once | INTRAVENOUS | Status: AC | PRN
  Administered 2016-01-29: 75 mL via INTRAVENOUS

## 2016-02-03 ENCOUNTER — Other Ambulatory Visit: Payer: Self-pay | Admitting: Internal Medicine

## 2016-02-03 DIAGNOSIS — R1310 Dysphagia, unspecified: Secondary | ICD-10-CM | POA: Insufficient documentation

## 2016-02-11 DIAGNOSIS — R1310 Dysphagia, unspecified: Secondary | ICD-10-CM | POA: Diagnosis not present

## 2016-02-16 ENCOUNTER — Encounter: Payer: Self-pay | Admitting: *Deleted

## 2016-02-17 ENCOUNTER — Ambulatory Visit: Payer: PPO | Admitting: Anesthesiology

## 2016-02-17 ENCOUNTER — Ambulatory Visit
Admission: RE | Admit: 2016-02-17 | Discharge: 2016-02-17 | Disposition: A | Discharge status: Home / Self Care | Payer: PPO | Source: Ambulatory Visit | Attending: Unknown Physician Specialty | Admitting: Unknown Physician Specialty

## 2016-02-17 ENCOUNTER — Encounter
Admission: RE | Disposition: A | Discharge status: Home / Self Care | Payer: Self-pay | Source: Ambulatory Visit | Attending: Unknown Physician Specialty

## 2016-02-17 ENCOUNTER — Encounter: Payer: Self-pay | Admitting: Anesthesiology

## 2016-02-17 DIAGNOSIS — I739 Peripheral vascular disease, unspecified: Secondary | ICD-10-CM | POA: Insufficient documentation

## 2016-02-17 DIAGNOSIS — Z961 Presence of intraocular lens: Secondary | ICD-10-CM | POA: Diagnosis not present

## 2016-02-17 DIAGNOSIS — M5030 Other cervical disc degeneration, unspecified cervical region: Secondary | ICD-10-CM | POA: Insufficient documentation

## 2016-02-17 DIAGNOSIS — I8393 Asymptomatic varicose veins of bilateral lower extremities: Secondary | ICD-10-CM | POA: Insufficient documentation

## 2016-02-17 DIAGNOSIS — R1310 Dysphagia, unspecified: Secondary | ICD-10-CM | POA: Diagnosis not present

## 2016-02-17 DIAGNOSIS — G709 Myoneural disorder, unspecified: Secondary | ICD-10-CM | POA: Insufficient documentation

## 2016-02-17 DIAGNOSIS — Z9842 Cataract extraction status, left eye: Secondary | ICD-10-CM | POA: Insufficient documentation

## 2016-02-17 DIAGNOSIS — J449 Chronic obstructive pulmonary disease, unspecified: Secondary | ICD-10-CM | POA: Diagnosis not present

## 2016-02-17 DIAGNOSIS — Z9889 Other specified postprocedural states: Secondary | ICD-10-CM | POA: Insufficient documentation

## 2016-02-17 DIAGNOSIS — Z87891 Personal history of nicotine dependence: Secondary | ICD-10-CM | POA: Diagnosis not present

## 2016-02-17 DIAGNOSIS — M19049 Primary osteoarthritis, unspecified hand: Secondary | ICD-10-CM | POA: Diagnosis not present

## 2016-02-17 HISTORY — DX: Other cervical disc degeneration, unspecified cervical region: M50.30

## 2016-02-17 HISTORY — PX: ESOPHAGOGASTRODUODENOSCOPY (EGD) WITH PROPOFOL: SHX5813

## 2016-02-17 HISTORY — DX: Varicose veins of bilateral lower extremities with other complications: I83.893

## 2016-02-17 HISTORY — DX: Dysphagia, unspecified: R13.10

## 2016-02-17 HISTORY — DX: Dysuria: R30.0

## 2016-02-17 HISTORY — DX: Intervertebral disc disorders with myelopathy, lumbar region: M51.06

## 2016-02-17 SURGERY — ESOPHAGOGASTRODUODENOSCOPY (EGD) WITH PROPOFOL
Anesthesia: General

## 2016-02-17 MED ORDER — GLYCOPYRROLATE 0.2 MG/ML IJ SOLN
INTRAMUSCULAR | Status: DC | PRN
  Administered 2016-02-17: 0.2 mg via INTRAVENOUS

## 2016-02-17 MED ORDER — LIDOCAINE HCL (CARDIAC) 20 MG/ML IV SOLN
INTRAVENOUS | Status: DC | PRN
  Administered 2016-02-17: 100 mg via INTRAVENOUS

## 2016-02-17 MED ORDER — IPRATROPIUM-ALBUTEROL 0.5-2.5 (3) MG/3ML IN SOLN
RESPIRATORY_TRACT | Status: AC
  Filled 2016-02-17: qty 3

## 2016-02-17 MED ORDER — PROPOFOL 500 MG/50ML IV EMUL
INTRAVENOUS | Status: DC | PRN
  Administered 2016-02-17: 120 ug/kg/min via INTRAVENOUS

## 2016-02-17 MED ORDER — FENTANYL CITRATE (PF) 100 MCG/2ML IJ SOLN
INTRAMUSCULAR | Status: DC | PRN
  Administered 2016-02-17: 50 ug via INTRAVENOUS

## 2016-02-17 MED ORDER — PROPOFOL 10 MG/ML IV BOLUS
INTRAVENOUS | Status: DC | PRN
Start: 2016-02-17 — End: 2016-02-17
  Administered 2016-02-17 (×2): 10 mg via INTRAVENOUS
  Administered 2016-02-17: 60 mg via INTRAVENOUS

## 2016-02-17 MED ORDER — IPRATROPIUM-ALBUTEROL 0.5-2.5 (3) MG/3ML IN SOLN
3.0000 mL | Freq: Once | RESPIRATORY_TRACT | Status: AC
  Administered 2016-02-17: 3 mL via RESPIRATORY_TRACT

## 2016-02-17 MED ORDER — SODIUM CHLORIDE 0.9 % IV SOLN
INTRAVENOUS | Status: DC
  Administered 2016-02-17: 1000 mL via INTRAVENOUS

## 2016-02-17 MED ORDER — PHENYLEPHRINE HCL 10 MG/ML IJ SOLN
INTRAMUSCULAR | Status: DC | PRN
  Administered 2016-02-17: 50 ug via INTRAVENOUS

## 2016-02-17 NOTE — Anesthesia Postprocedure Evaluation (Signed)
Anesthesia Post Note  Patient: Taisean Risko  Procedure(s) Performed: Procedure(s) (LRB): ESOPHAGOGASTRODUODENOSCOPY (EGD) WITH PROPOFOL (N/A)  Patient location during evaluation: Endoscopy Anesthesia Type: General Level of consciousness: awake and alert Pain management: pain level controlled Vital Signs Assessment: post-procedure vital signs reviewed and stable Respiratory status: spontaneous breathing, nonlabored ventilation, respiratory function stable and patient connected to nasal cannula oxygen Cardiovascular status: blood pressure returned to baseline and stable Postop Assessment: no signs of nausea or vomiting Anesthetic complications: no    Last Vitals:  Filed Vitals:   02/17/16 1018 02/17/16 1028  BP: 120/61 109/85  Pulse: 68 62  Temp:    Resp: 16 19    Last Pain: There were no vitals filed for this visit.               Precious Haws Piscitello

## 2016-02-17 NOTE — Anesthesia Preprocedure Evaluation (Signed)
Anesthesia Evaluation  Patient identified by MRN, date of birth, ID band Patient awake    Reviewed: Allergy & Precautions, H&P , NPO status , Patient's Chart, lab work & pertinent test results  History of Anesthesia Complications Negative for: history of anesthetic complications  Airway Mallampati: III  TM Distance: <3 FB Neck ROM: limited    Dental  (+) Poor Dentition, Chipped, Missing, Partial Upper, Partial Lower   Pulmonary neg shortness of breath, COPD, former smoker,    Pulmonary exam normal breath sounds clear to auscultation       Cardiovascular Exercise Tolerance: Good (-) angina+ Peripheral Vascular Disease  (-) Past MI and (-) DOE Normal cardiovascular exam Rhythm:regular Rate:Normal     Neuro/Psych PSYCHIATRIC DISORDERS  Neuromuscular disease    GI/Hepatic negative GI ROS, Neg liver ROS,   Endo/Other  negative endocrine ROS  Renal/GU negative Renal ROS  negative genitourinary   Musculoskeletal  (+) Arthritis ,   Abdominal   Peds  Hematology negative hematology ROS (+)   Anesthesia Other Findings Past Medical History:   Posttraumatic stress disorder                                Degeneration of cervical intervertebral disc                 Lattice degeneration of peripheral retina                    Osteoarthrosis, unspecified whether generalize*              Benign neoplasm of colon                                     Chronic airway obstruction, not elsewhere clas*              COPD (chronic obstructive pulmonary disease) (*              Leg varices                                     03/10/2015     Sinus bradycardia                               04/17/2014    Wears dentures                                                 Comment:partial upper and lower   DDD (degenerative disc disease), cervical                    Dysuria                                                      Dysphagia  Varicose veins of bilateral lower extremities *              Intervertebral disc disorder of lumbar region *             Past Surgical History:   TONSILLECTOMY                                                 COLONOSCOPY                                                   CYSTOSCOPY                                                    CATARACT EXTRACTION                             Right 2015         COLONOSCOPY                                      2014           Comment:benign polyps   CATARACT EXTRACTION W/PHACO                     Left 10/21/2015      Comment:Procedure: CATARACT EXTRACTION PHACO AND               INTRAOCULAR LENS PLACEMENT ([place]);  Surgeon:               Leandrew Koyanagi, MD;  Location: [place];  Service: Ophthalmology;                Laterality: Left;     Reproductive/Obstetrics negative OB ROS                             Anesthesia Physical Anesthesia Plan  ASA: III  Anesthesia Plan: General   Post-op Pain Management:    Induction:   Airway Management Planned:   Additional Equipment:   Intra-op Plan:   Post-operative Plan:   Informed Consent: I have reviewed the patients History and Physical, chart, labs and discussed the procedure including the risks, benefits and alternatives for the proposed anesthesia with the patient or authorized representative who has indicated his/her understanding and acceptance.   Dental Advisory Given  Plan Discussed with: Anesthesiologist, CRNA and Surgeon  Anesthesia Plan Comments:         Anesthesia Quick Evaluation

## 2016-02-17 NOTE — Op Note (Signed)
[facility] Gastroenterology Patient Name: Timothy Scott Procedure Date: 02/17/2016 9:43 AM MRN: [id number] Account #: [id number] Date of Birth: 1946-06-23 Admit Type: Outpatient Age: 70 Room: [facility] ENDO ROOM 4 Gender: Male Note Status: Finalized Procedure:            Upper GI endoscopy Indications:          Dysphagia Providers:            Manya Silvas, MD Referring MD:         Halina Maidens, MD (Referring MD) Medicines:            Propofol per Anesthesia Complications:        No immediate complications. Procedure:            Pre-Anesthesia Assessment:                       - After reviewing the risks and benefits, the patient                        was deemed in satisfactory condition to undergo the                        procedure.                       After obtaining informed consent, the endoscope was                        passed under direct vision. Throughout the procedure,                        the patient's blood pressure, pulse, and oxygen                        saturations were monitored continuously. The Endoscope                        was introduced through the mouth, and advanced to the                        second part of duodenum. The upper GI endoscopy was                        accomplished without difficulty. The patient tolerated                        the procedure well. Findings:      The examined esophagus was normal. After exam of UGI tract was done A       guidewire was placed and the scope was withdrawn. Dilation was performed       with a Savary dilator with mild resistance at 17 mm.      The stomach was normal.      The examined duodenum was normal. Impression:           - Normal esophagus. Dilated.                       - Normal stomach.                       - Normal examined duodenum.                       -  No specimens collected. Recommendation:       - soft food for 3 days, eat slowly, chew well, take           small bites Manya Silvas, MD 02/17/2016 9:57:10 AM This report has been signed electronically. Number of Addenda: 0 Note Initiated On: 02/17/2016 9:43 AM      [facility]

## 2016-02-17 NOTE — Transfer of Care (Signed)
Immediate Anesthesia Transfer of Care Note  Patient: Timothy Scott  Procedure(s) Performed: Procedure(s): ESOPHAGOGASTRODUODENOSCOPY (EGD) WITH PROPOFOL (N/A)  Patient Location: PACU  Anesthesia Type:General  Level of Consciousness: awake, alert  and oriented  Airway & Oxygen Therapy: Patient Spontanous Breathing and Patient connected to nasal cannula oxygen  Post-op Assessment: Report given to RN and Post -op Vital signs reviewed and stable  Post vital signs: Reviewed and stable  Last Vitals:  Filed Vitals:   02/17/16 0922  BP: 117/62  Pulse: 57  Temp: 36.4 C  Resp: 16    Last Pain: There were no vitals filed for this visit.       Complications: No apparent anesthesia complications

## 2016-02-19 ENCOUNTER — Encounter: Payer: Self-pay | Admitting: Unknown Physician Specialty

## 2016-03-01 ENCOUNTER — Encounter: Payer: Self-pay | Admitting: Internal Medicine

## 2016-03-01 ENCOUNTER — Ambulatory Visit (INDEPENDENT_AMBULATORY_CARE_PROVIDER_SITE_OTHER): Payer: PPO | Admitting: Internal Medicine

## 2016-03-01 VITALS — BP 126/82 | HR 65 | Resp 16 | Ht 71.0 in | Wt 167.2 lb

## 2016-03-01 DIAGNOSIS — R430 Anosmia: Secondary | ICD-10-CM

## 2016-03-01 DIAGNOSIS — R1310 Dysphagia, unspecified: Secondary | ICD-10-CM | POA: Diagnosis not present

## 2016-03-01 NOTE — Progress Notes (Signed)
Date:  03/01/2016   Name:  Timothy Scott   DOB:  December 21, 1945   MRN:  [id number]   Chief Complaint: Dysphagia Patient is here to follow-up from dysphasia. Most recently had an EGD with Dr. Vira Agar which showed a normal esophagus and stomach. Despite this, his esophagus was dilated.  He has undergone a CT of the neck which was normal.  He also had a normal swallowing study.  He was seen by ENT and had laryngoscopy - all normal. He still has the sensation that the back of his throat is swollen and abrasive. He has now noticed a decreased sense of smell.  Review of Systems  Constitutional: Positive for fatigue. Negative for fever, chills and unexpected weight change.  HENT: Positive for trouble swallowing. Negative for congestion, postnasal drip, sore throat and voice change.   Eyes: Negative for visual disturbance.  Respiratory: Negative for cough, choking, chest tightness, shortness of breath and wheezing.   Cardiovascular: Negative for chest pain and palpitations.  Gastrointestinal: Negative for abdominal pain.    Patient Active Problem List   Diagnosis Date Noted  . Dysphagia 02/03/2016  . Tobacco use disorder, moderate, in sustained remission 03/11/2015  . Lumbar disc herniation with radiculopathy 03/11/2015  . Gonalgia 03/10/2015  . DDD (degenerative disc disease), cervical 03/10/2015  . Difficult or painful urination 03/10/2015  . COPD, mild ([place]) 03/10/2015  . Degenerative arthritis of finger 03/10/2015  . Neurosis, posttraumatic 03/10/2015  . Lattice degeneration 03/10/2015  . Leg varices 03/10/2015  . Sinus bradycardia 04/17/2014    Prior to Admission medications   Medication Sig Start Date End Date Taking? Authorizing Provider  azelastine (ASTELIN) 0.1 % nasal spray  11/18/15  Yes Historical Provider, MD  Calcium Carb-Cholecalciferol 600-800 MG-UNIT CHEW Chew by mouth.   Yes Historical Provider, MD  Calcium Carbonate-Vit D-Min (CALCIUM 1200 PO) Take 1 mg by mouth.    Yes Historical Provider, MD  finasteride (PROSCAR) 5 MG tablet Take 5 mg by mouth daily.   Yes Historical Provider, MD  fluticasone (FLONASE) 50 MCG/ACT nasal spray Place 2 sprays into both nostrils daily.   Yes Historical Provider, MD  magnesium oxide (MAG-OX) 400 MG tablet Take 400 mg by mouth daily.   Yes Historical Provider, MD  naproxen (NAPROSYN) 500 MG tablet Take 500 mg by mouth 2 (two) times daily with a meal.   Yes Historical Provider, MD  omeprazole (PRILOSEC) 40 MG capsule Take 40 mg by mouth daily.   Yes Historical Provider, MD  Probiotic Product (PROBIOTIC DAILY PO) Take by mouth.   Yes Historical Provider, MD  Saw Palmetto 450 MG CAPS Take by mouth daily.   Yes Historical Provider, MD  traZODone (DESYREL) 50 MG tablet Take 50 mg by mouth at bedtime.   Yes Historical Provider, MD  Turmeric 500 MG TABS Take by mouth daily.   Yes Historical Provider, MD    No Known Allergies  Past Surgical History  Procedure Laterality Date  . Tonsillectomy    . Colonoscopy    . Cystoscopy    . Cataract extraction Right 2015  . Colonoscopy  2014    benign polyps  . Cataract extraction w/phaco Left 10/21/2015    Procedure: CATARACT EXTRACTION PHACO AND INTRAOCULAR LENS PLACEMENT (IOC);  Surgeon: Leandrew Koyanagi, MD;  Location: [place];  Service: Ophthalmology;  Laterality: Left;  . Esophagogastroduodenoscopy (egd) with propofol N/A 02/17/2016    with esoph dilatation    Social History  Substance Use Topics  .  Smoking status: Former Research scientist (life sciences)  . Smokeless tobacco: None     Comment: quit 25-30 yrs ago  . Alcohol Use: 8.4 oz/week    14 Standard drinks or equivalent per week     Comment: socially     Medication list has been reviewed and updated.   Physical Exam  Constitutional: He is oriented to person, place, and time. He appears well-developed. No distress.  HENT:  Head: Normocephalic and atraumatic.  Right Ear: Tympanic membrane and ear canal normal.  Left Ear:  Tympanic membrane and ear canal normal.  Nose: Right sinus exhibits no maxillary sinus tenderness and no frontal sinus tenderness. Left sinus exhibits no maxillary sinus tenderness and no frontal sinus tenderness.  Mouth/Throat: Oropharynx is clear and moist. No posterior oropharyngeal edema or posterior oropharyngeal erythema.  Neck: Normal range of motion. Neck supple. No thyromegaly present.  Cardiovascular: Normal rate, regular rhythm and normal heart sounds.   Pulmonary/Chest: Effort normal and breath sounds normal. No respiratory distress.  Musculoskeletal: Normal range of motion.  Neurological: He is alert and oriented to person, place, and time. He has normal strength and normal reflexes. No sensory deficit.  Skin: Skin is warm and dry. No rash noted.  Psychiatric: He has a normal mood and affect. His speech is normal and behavior is normal. Thought content normal.  Nursing note and vitals reviewed.   BP 126/82 mmHg  Pulse 65  Resp 16  Ht 5\' 11"  (1.803 m)  Wt 167 lb 3.2 oz (75.841 kg)  BMI 23.33 kg/m2  SpO2 97%  Assessment and Plan: 1. Anosmia Need MRI to evaluate - MR Brain W Wo Contrast; Future  2. Dysphagia Rule out tumor or stroke with MRI - MR Brain W Wo Contrast; Future   Halina Maidens, MD [facility]  03/01/2016

## 2016-03-18 ENCOUNTER — Ambulatory Visit
Admission: RE | Admit: 2016-03-18 | Discharge: 2016-03-18 | Disposition: A | Discharge status: Home / Self Care | Payer: PPO | Source: Ambulatory Visit | Attending: Internal Medicine | Admitting: Internal Medicine

## 2016-03-18 DIAGNOSIS — R430 Anosmia: Secondary | ICD-10-CM | POA: Insufficient documentation

## 2016-03-18 DIAGNOSIS — R9082 White matter disease, unspecified: Secondary | ICD-10-CM | POA: Diagnosis not present

## 2016-03-18 DIAGNOSIS — R1310 Dysphagia, unspecified: Secondary | ICD-10-CM | POA: Diagnosis not present

## 2016-03-18 MED ORDER — GADOBENATE DIMEGLUMINE 529 MG/ML IV SOLN
15.0000 mL | Freq: Once | INTRAVENOUS | Status: AC | PRN
  Administered 2016-03-18: 15 mL via INTRAVENOUS

## 2016-04-04 ENCOUNTER — Other Ambulatory Visit: Payer: Self-pay

## 2016-04-04 DIAGNOSIS — R4702 Dysphasia: Secondary | ICD-10-CM

## 2016-04-05 ENCOUNTER — Encounter: Payer: Self-pay | Admitting: Diagnostic Neuroimaging

## 2016-04-05 ENCOUNTER — Ambulatory Visit (INDEPENDENT_AMBULATORY_CARE_PROVIDER_SITE_OTHER): Payer: PPO | Admitting: Diagnostic Neuroimaging

## 2016-04-05 VITALS — Ht 71.5 in | Wt 173.8 lb

## 2016-04-05 DIAGNOSIS — F458 Other somatoform disorders: Secondary | ICD-10-CM

## 2016-04-05 DIAGNOSIS — R0989 Other specified symptoms and signs involving the circulatory and respiratory systems: Secondary | ICD-10-CM

## 2016-04-05 NOTE — Patient Instructions (Signed)
Thank you for coming to see [place] at [facility]. I hope we have been able to provide you high quality care today.  You may receive a patient satisfaction survey over the next few weeks. We would appreciate your feedback and comments so that we may continue to improve ourselves and the health of our patients.  - I will check lab testing  - follow up with PCP and cardiology re: lightheadedness and low heart rate  - follow up with GI and ENT re: globus sensation in throat  - follow up with psychiatry/psychology re: anxiety and PTSD   ~~~~~~~~~~~~~~~~~~~~~~~~~~~~~~~~~~~~~~~~~~~~~~~~~~~~~~~~~~~~~~~~~  DR. Johnta Couts'S GUIDE TO HAPPY AND HEALTHY LIVING These are some of my general health and wellness recommendations. Some of them may apply to you better than others. Please use common sense as you try these suggestions and feel free to ask me any questions.   ACTIVITY/FITNESS Mental, social, emotional and physical stimulation are very important for brain and body health. Try learning a new activity (arts, music, language, sports, games).  Keep moving your body to the best of your abilities. You can do this at home, inside or outside, the park, community center, gym or anywhere you like. Consider a physical therapist or personal trainer to get started. Consider the app Sworkit. Fitness trackers such as smart-watches, smart-phones or Fitbits can help as well.   NUTRITION Eat more plants: colorful vegetables, nuts, seeds and berries.  Eat less sugar, salt, preservatives and processed foods.  Avoid toxins such as cigarettes and alcohol.  Drink water when you are thirsty. Warm water with a slice of lemon is an excellent morning drink to start the day.  Consider these websites for more information The Nutrition Source (https://www.henry-hernandez.biz/) Precision Nutrition (WindowBlog.ch)   RELAXATION Consider practicing mindfulness  meditation or other relaxation techniques such as deep breathing, prayer, yoga, tai chi, massage. See website mindful.org or the apps Headspace or Calm to help get started.   SLEEP Try to get at least 7-8+ hours sleep per day. Regular exercise and reduced caffeine will help you sleep better. Practice good sleep hygeine techniques. See website sleep.org for more information.   PLANNING Prepare estate planning, living will, healthcare POA documents. Sometimes this is best planned with the help of an attorney. Theconversationproject.org and agingwithdignity.org are excellent resources.

## 2016-04-05 NOTE — Progress Notes (Signed)
[facility]  PATIENT: Timothy Scott DOB: April 12, 1946  REFERRING CLINICIAN: Army Melia, L HISTORY FROM: patient and wife  REASON FOR VISIT: new consult   HISTORICAL  CHIEF COMPLAINT:  Chief Complaint  Patient presents with  . Dizziness    rm 6, fiancee- Jill, New Pt, "sore throat x 5-6 mos, have seen ENT but not improved- feels like a lump in the back of my throat; dizziness x 3-4 weeks, not related to any activity/time of day"     HISTORY OF PRESENT ILLNESS:   70 year old male here for evaluation of abnormal sensation in throat for past 5-6 months. Patient reports a lump, foreign body sensation in his throat. He feels swollen sensation. When he eats and chews and swallows he has some slight increase in sensation. However symptom is fairly constant throughout even without talking or swallowing. Patient also has noticed some decreased sense of smell, lightheadedness, faintness and dizziness. Patient is seen ENT, GI, had barium swallow study, endoscopy, CT scan of soft tissue of the neck, MRI of the brain, all which have been unremarkable. Patient denies any problems with his arms, legs, muscle strength, vision. Patient does have history of PTSD and is on medication. No increase in anxiety or stress recently.    REVIEW OF SYSTEMS: Full 14 system review of systems performed and negative with exception of: Fatigue blurred vision dizziness decreased energy.  ALLERGIES: No Known Allergies  HOME MEDICATIONS: Outpatient Medications Prior to Visit  Medication Sig Dispense Refill  . azelastine (ASTELIN) 0.1 % nasal spray     . Calcium Carb-Cholecalciferol 600-800 MG-UNIT CHEW Chew by mouth.    . Calcium Carbonate-Vit D-Min (CALCIUM 1200 PO) Take 1 mg by mouth.    . finasteride (PROSCAR) 5 MG tablet Take 5 mg by mouth daily. Reported on 03/01/2016    . fluticasone (FLONASE) 50 MCG/ACT nasal spray Place 2 sprays into both nostrils daily. Reported on 03/01/2016    .  magnesium oxide (MAG-OX) 400 MG tablet Take 400 mg by mouth daily.    . naproxen (NAPROSYN) 500 MG tablet Take 500 mg by mouth 2 (two) times daily with a meal.    . omeprazole (PRILOSEC) 40 MG capsule Take 40 mg by mouth daily.    . Probiotic Product (PROBIOTIC DAILY PO) Take by mouth.    . Saw Palmetto 450 MG CAPS Take by mouth daily.    . traZODone (DESYREL) 50 MG tablet Take 50 mg by mouth at bedtime.    . Turmeric 500 MG TABS Take by mouth daily.     No facility-administered medications prior to visit.     PAST MEDICAL HISTORY: Past Medical History:  Diagnosis Date  . Benign neoplasm of colon   . Chronic airway obstruction, not elsewhere classified   . COPD (chronic obstructive pulmonary disease) ([place])   . DDD (degenerative disc disease), cervical   . Degeneration of cervical intervertebral disc   . Dysphagia   . Dysuria   . 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 Lattice degeneration of peripheral retina   . Leg varices 03/10/2015  . Osteoarthrosis, unspecified whether generalized or localized, hand   . Posttraumatic stress disorder   . Sinus bradycardia 04/17/2014  . Varicose veins of bilateral lower extremities with other complications   . Wears dentures    partial upper and lower    PAST SURGICAL HISTORY: Past Surgical History:  Procedure Laterality Date  . CATARACT EXTRACTION Right 2015  . CATARACT EXTRACTION  W/PHACO Left 10/21/2015   Procedure: CATARACT EXTRACTION PHACO AND INTRAOCULAR LENS PLACEMENT (IOC);  Surgeon: Leandrew Koyanagi, MD;  Location: [facility];  Service: Ophthalmology;  Laterality: Left;  . COLONOSCOPY    . COLONOSCOPY  2014   benign polyps  . CYSTOSCOPY    . ESOPHAGOGASTRODUODENOSCOPY (EGD) WITH PROPOFOL N/A 02/17/2016   with esoph dilatation  . TONSILLECTOMY      FAMILY HISTORY: Family History  Problem Relation Age of Onset  . Hypertension Mother   . Prostate cancer Neg Hx     SOCIAL HISTORY:  Social  History   Social History  . Marital status: Single    Spouse name: N/A  . Number of children: N/A  . Years of education: N/A   Occupational History  . Not on file.   Social History Main Topics  . Smoking status: Former Smoker    Quit date: 04/05/1986  . Smokeless tobacco: Never Used     Comment: quit 25-30 yrs ago  . Alcohol use 8.4 oz/week    14 Standard drinks or equivalent per week     Comment: socially  . Drug use: No  . Sexual activity: Not on file   Other Topics Concern  . Not on file   Social History Narrative  . No narrative on file     PHYSICAL EXAM  GENERAL EXAM/CONSTITUTIONAL: Vitals:  Vitals:   04/05/16 1126  Weight: 173 lb 12.8 oz (78.8 kg)  Height: 5' 11.5" (1.816 m)     Body mass index is 23.9 kg/m.  Visual Acuity Screening   Right eye Left eye Both eyes  Without correction: 20/30 20/30   With correction:        Patient is in no distress; well developed, nourished and groomed; neck is supple  NO PALPABLE NECK MASSES  CARDIOVASCULAR:  Examination of carotid arteries is normal; no carotid bruits  Regular rate and rhythm, no murmurs  Examination of peripheral vascular system by observation and palpation is normal  EYES:  Ophthalmoscopic exam of optic discs and posterior segments is normal; no papilledema or hemorrhages  MUSCULOSKELETAL:  Gait, strength, tone, movements noted in Neurologic exam below  NEUROLOGIC: MENTAL STATUS:  No flowsheet data found.  awake, alert, oriented to person, place and time  recent and remote memory intact  normal attention and concentration  language fluent, comprehension intact, naming intact,   fund of knowledge appropriate  CRANIAL NERVE:   2nd - no papilledema on fundoscopic exam  2nd, 3rd, 4th, 6th - pupils equal and reactive to light, visual fields full to confrontation, extraocular muscles intact, no nystagmus  5th - facial sensation symmetric  7th - facial strength  symmetric  8th - hearing intact  9th - palate elevates symmetrically, uvula midline  11th - shoulder shrug symmetric  12th - tongue protrusion midline  MOTOR:   normal bulk and tone, full strength in the BUE, BLE  SENSORY:   normal and symmetric to light touch, temperature, vibration  COORDINATION:   finger-nose-finger, fine finger movements normal  REFLEXES:   deep tendon reflexes present and symmetric; EXCEPT ABSENT AT ANKLES  GAIT/STATION:   narrow based gait; able to walk tandem; romberg is negative    DIAGNOSTIC DATA (LABS, IMAGING, TESTING) - I reviewed patient records, labs, notes, testing and imaging myself where available.  No results found for: WBC, HGB, HCT, MCV, PLT    Component Value Date/Time   CREATININE 0.80 01/29/2016 0803   No results found for: CHOL, HDL, LDLCALC,  LDLDIRECT, TRIG, CHOLHDL No results found for: HGBA1C No results found for: VITAMINB12 No results found for: TSH   03/18/16 MRI brain [I reviewed images myself and agree with interpretation. -VRP]  - Mild mucosal thickening in the ethmoid sinuses. Negative for mass lesion or other cause of anosmia. - Mild chronic microvascular ischemic change in the white matter. No acute intracranial abnormality.  01/29/16 CT soft tissue neck [I reviewed images myself and agree with interpretation. -VRP]  - No neck mass, acute abnormality, or etiology of dysphagia identified. - Moderate disc degeneration throughout the lower cervical spine.     ASSESSMENT AND PLAN  70 y.o. year old male here with abnormal lump, foreign body, globus sensation in the throat for past 5-6 months. Neurologic examination unremarkable except for decreased reflexes in the ankles. ENT, GI and now neurology evaluation have not identified specific etiology. No specific neurologic explanation is found. We'll check additional lab screening testing for other symptoms such as decreased smell, dizziness and  faintness.  Localization:  Ddx:  1. Globus sensation      PLAN: - I will check lab testing  - monitor symptoms  - follow up with PCP and cardiology re: lightheadedness and low heart rate  - follow up with GI and ENT re: globus sensation in throat  - follow up with psychiatry/psychology re: anxiety and PTSD  Orders Placed This Encounter  Procedures  . CBC with Differential/Platelet  . Comprehensive metabolic panel  . Vitamin B12  . TSH  . Hemoglobin A1c   Return if symptoms worsen or fail to improve, for return to PCP.    Penni Bombard, MD [id number], [id number] PM Certified in Neurology, Neurophysiology and Neuroimaging  [facility] [address] [place], [place] [address] [phone]

## 2016-04-06 ENCOUNTER — Telehealth: Payer: Self-pay | Admitting: *Deleted

## 2016-04-06 LAB — COMPREHENSIVE METABOLIC PANEL
A/G RATIO: 2.5 — AB (ref 1.2–2.2)
ALBUMIN: 4.8 g/dL (ref 3.5–4.8)
ALT: 16 IU/L (ref 0–44)
AST: 18 IU/L (ref 0–40)
Alkaline Phosphatase: 48 IU/L (ref 39–117)
BILIRUBIN TOTAL: 0.4 mg/dL (ref 0.0–1.2)
BUN / CREAT RATIO: 14 (ref 10–24)
BUN: 11 mg/dL (ref 8–27)
CHLORIDE: 103 mmol/L (ref 96–106)
CO2: 27 mmol/L (ref 18–29)
Calcium: 9.5 mg/dL (ref 8.6–10.2)
Creatinine, Ser: 0.79 mg/dL (ref 0.76–1.27)
GFR calc non Af Amer: 91 mL/min/{1.73_m2} (ref >59)
GFR, EST AFRICAN AMERICAN: 105 mL/min/{1.73_m2} (ref >59)
GLUCOSE: 93 mg/dL (ref 65–99)
Globulin, Total: 1.9 g/dL (ref 1.5–4.5)
POTASSIUM: 5.3 mmol/L — AB (ref 3.5–5.2)
Sodium: 143 mmol/L (ref 134–144)
TOTAL PROTEIN: 6.7 g/dL (ref 6.0–8.5)

## 2016-04-06 LAB — CBC WITH DIFFERENTIAL/PLATELET
BASOS ABS: 0 10*3/uL (ref 0.0–0.2)
Basos: 0 %
EOS (ABSOLUTE): 0.1 10*3/uL (ref 0.0–0.4)
Eos: 2 %
HEMOGLOBIN: 14.3 g/dL (ref 12.6–17.7)
Hematocrit: 42 % (ref 37.5–51.0)
IMMATURE GRANS (ABS): 0 10*3/uL (ref 0.0–0.1)
Immature Granulocytes: 0 %
LYMPHS ABS: 1.4 10*3/uL (ref 0.7–3.1)
LYMPHS: 19 %
MCH: 32.4 pg (ref 26.6–33.0)
MCHC: 34 g/dL (ref 31.5–35.7)
MCV: 95 fL (ref 79–97)
MONOCYTES: 10 %
Monocytes Absolute: 0.7 10*3/uL (ref 0.1–0.9)
NEUTROS PCT: 69 %
Neutrophils Absolute: 5 10*3/uL (ref 1.4–7.0)
PLATELETS: 297 10*3/uL (ref 150–379)
RBC: 4.42 x10E6/uL (ref 4.14–5.80)
RDW: 13.5 % (ref 12.3–15.4)
WBC: 7.3 10*3/uL (ref 3.4–10.8)

## 2016-04-06 LAB — VITAMIN B12: VITAMIN B 12: 369 pg/mL (ref 211–946)

## 2016-04-06 LAB — HEMOGLOBIN A1C
Est. average glucose Bld gHb Est-mCnc: 111 mg/dL
Hgb A1c MFr Bld: 5.5 % (ref 4.8–5.6)

## 2016-04-06 LAB — TSH: TSH: 2.92 u[IU]/mL (ref 0.450–4.500)

## 2016-04-06 NOTE — Telephone Encounter (Signed)
Per Dr Leta Baptist, spoke with patient and informed him his lab results are unremarkable. Dr Leta Baptist will continue with current treatment plan. He verbalized understanding, appreciation.

## 2016-05-05 ENCOUNTER — Encounter: Payer: Self-pay | Admitting: Internal Medicine

## 2016-05-05 ENCOUNTER — Ambulatory Visit (INDEPENDENT_AMBULATORY_CARE_PROVIDER_SITE_OTHER): Payer: PPO | Admitting: Internal Medicine

## 2016-05-05 VITALS — BP 122/80 | HR 74 | Resp 16 | Ht 71.5 in | Wt 174.0 lb

## 2016-05-05 DIAGNOSIS — F4310 Post-traumatic stress disorder, unspecified: Secondary | ICD-10-CM

## 2016-05-05 DIAGNOSIS — R430 Anosmia: Secondary | ICD-10-CM | POA: Diagnosis not present

## 2016-05-05 DIAGNOSIS — R1310 Dysphagia, unspecified: Secondary | ICD-10-CM | POA: Diagnosis not present

## 2016-05-05 NOTE — Progress Notes (Signed)
Date:  05/05/2016   Name:  Timothy Scott   DOB:  1946/02/15   MRN:  [id number]   Chief Complaint: Sore Throat Dysphagia and anosmia - patient presents for follow-up of these problems. The change in smell was not particularly worrisome at this time. ENT evaluation was unrevealing. He did take a trial of omeprazole without much benefit but he decided to resume that.  With regards to dysphagia, he's been seen by GI and had an upper endoscopy which was completely benign. He did have a dilatation of his esophagus which has not changed his symptoms. He also underwent a CT scan of the neck which was entirely normal. He also underwent an MRI of the brain which was normal except for small vessel disease. Neurology consultation did not find any cause for his symptoms. He is now wondering which direction he should go for further evaluation.   Review of Systems  Constitutional: Negative for chills, fever and unexpected weight change.  Eyes: Negative for visual disturbance.  Respiratory: Negative for chest tightness and shortness of breath.   Cardiovascular: Negative for chest pain, palpitations and leg swelling.  Gastrointestinal: Negative for diarrhea, nausea and vomiting.  Neurological: Negative for dizziness, tremors and headaches.    Patient Active Problem List   Diagnosis Date Noted  . Dysphagia 02/03/2016  . Tobacco use disorder, moderate, in sustained remission 03/11/2015  . Lumbar disc herniation with radiculopathy 03/11/2015  . Gonalgia 03/10/2015  . DDD (degenerative disc disease), cervical 03/10/2015  . Difficult or painful urination 03/10/2015  . COPD, mild ([place]) 03/10/2015  . Degenerative arthritis of finger 03/10/2015  . Post-traumatic stress disorder 03/10/2015  . Lattice degeneration 03/10/2015  . Leg varices 03/10/2015  . Sinus bradycardia 04/17/2014    Prior to Admission medications   Medication Sig Start Date End Date Taking? Authorizing Provider  azelastine  (ASTELIN) 0.1 % nasal spray  11/18/15  Yes Historical Provider, MD  Calcium Carb-Cholecalciferol 600-800 MG-UNIT CHEW Chew by mouth.   Yes Historical Provider, MD  Calcium Carbonate-Vit D-Min (CALCIUM 1200 PO) Take 1 mg by mouth.   Yes Historical Provider, MD  fluticasone (FLONASE) 50 MCG/ACT nasal spray Place 2 sprays into both nostrils daily. Reported on 03/01/2016   Yes Historical Provider, MD  magnesium oxide (MAG-OX) 400 MG tablet Take 400 mg by mouth daily.   Yes Historical Provider, MD  naproxen (NAPROSYN) 500 MG tablet Take 500 mg by mouth 2 (two) times daily with a meal.   Yes Historical Provider, MD  omeprazole (PRILOSEC) 40 MG capsule Take 40 mg by mouth daily.   Yes Historical Provider, MD  Probiotic Product (PROBIOTIC DAILY PO) Take by mouth.   Yes Historical Provider, MD  Saw Palmetto 450 MG CAPS Take by mouth daily.   Yes Historical Provider, MD  traZODone (DESYREL) 50 MG tablet Take 50 mg by mouth at bedtime.   Yes Historical Provider, MD  Turmeric 500 MG TABS Take by mouth daily.   Yes Historical Provider, MD    No Known Allergies  Past Surgical History:  Procedure Laterality Date  . CATARACT EXTRACTION Right 2015  . CATARACT EXTRACTION W/PHACO Left 10/21/2015   Procedure: CATARACT EXTRACTION PHACO AND INTRAOCULAR LENS PLACEMENT (IOC);  Surgeon: Leandrew Koyanagi, MD;  Location: [facility];  Service: Ophthalmology;  Laterality: Left;  . COLONOSCOPY    . COLONOSCOPY  2014   benign polyps  . CYSTOSCOPY    . ESOPHAGOGASTRODUODENOSCOPY (EGD) WITH PROPOFOL N/A 02/17/2016   with esoph dilatation  .  TONSILLECTOMY      Social History  Substance Use Topics  . Smoking status: Former Smoker    Quit date: 04/05/1986  . Smokeless tobacco: Never Used     Comment: quit 25-30 yrs ago  . Alcohol use 8.4 oz/week    14 Standard drinks or equivalent per week     Comment: socially     Medication list has been reviewed and updated.   Physical Exam  Constitutional: He is  oriented to person, place, and time. He appears well-developed. No distress.  HENT:  Head: Normocephalic and atraumatic.  Pulmonary/Chest: Effort normal. No respiratory distress.  Neurological: He is alert and oriented to person, place, and time.  Skin: Skin is warm and dry. No rash noted.  Psychiatric: He has a normal mood and affect. His speech is normal and behavior is normal. Thought content normal.  Nursing note and vitals reviewed.   BP 122/80 (BP Location: Right Arm, Patient Position: Sitting, Cuff Size: Normal)   Pulse 74   Resp 16   Ht 5' 11.5" (1.816 m)   Wt 174 lb (78.9 kg)   SpO2 97%   BMI 23.93 kg/m   Assessment and Plan: 1. Dysphagia Recommend follow up with GI for possible esoph motility disorder not yet recognized  2. Post-traumatic stress disorder Follow up with Psychiatry - does not appear to be contributing to current issues   Halina Maidens, MD [facility]  05/05/2016

## 2016-05-05 NOTE — Assessment & Plan Note (Signed)
MRI brain and Neurology w/u negative

## 2016-05-26 DIAGNOSIS — H43813 Vitreous degeneration, bilateral: Secondary | ICD-10-CM | POA: Diagnosis not present

## 2016-05-30 ENCOUNTER — Ambulatory Visit: Payer: PPO | Admitting: Internal Medicine

## 2016-05-31 DIAGNOSIS — R1310 Dysphagia, unspecified: Secondary | ICD-10-CM | POA: Diagnosis not present

## 2016-05-31 DIAGNOSIS — F458 Other somatoform disorders: Secondary | ICD-10-CM | POA: Diagnosis not present

## 2016-06-14 ENCOUNTER — Ambulatory Visit: Payer: PPO | Admitting: Internal Medicine

## 2016-06-23 DIAGNOSIS — F458 Other somatoform disorders: Secondary | ICD-10-CM | POA: Diagnosis not present

## 2016-06-23 DIAGNOSIS — K219 Gastro-esophageal reflux disease without esophagitis: Secondary | ICD-10-CM | POA: Diagnosis not present

## 2016-06-23 DIAGNOSIS — Z87891 Personal history of nicotine dependence: Secondary | ICD-10-CM | POA: Diagnosis not present

## 2016-07-20 ENCOUNTER — Other Ambulatory Visit: Payer: Self-pay

## 2016-07-20 ENCOUNTER — Telehealth: Payer: Self-pay

## 2016-07-20 DIAGNOSIS — R1310 Dysphagia, unspecified: Secondary | ICD-10-CM

## 2016-07-20 DIAGNOSIS — R1319 Other dysphagia: Secondary | ICD-10-CM

## 2016-07-20 NOTE — Telephone Encounter (Signed)
Patient contacted to schedule an Esophageal Manometry and 24 hour Ph study at the request of Tammi Klippel PA of the t [facility]. 08/03/16 arrive at [place] office 12:00 noon. Fast for 4 hours prior. No PPI medications or H2 blockers per patient. May take TUMS, Gaviscon or Maalox.

## 2016-08-03 ENCOUNTER — Encounter (HOSPITAL_COMMUNITY)
Admission: RE | Disposition: A | Discharge status: Home / Self Care | Payer: Self-pay | Source: Ambulatory Visit | Attending: Gastroenterology

## 2016-08-03 ENCOUNTER — Ambulatory Visit (HOSPITAL_COMMUNITY)
Admission: RE | Admit: 2016-08-03 | Discharge: 2016-08-03 | Disposition: A | Discharge status: Home / Self Care | Payer: PPO | Source: Ambulatory Visit | Attending: Gastroenterology | Admitting: Gastroenterology

## 2016-08-03 DIAGNOSIS — R1310 Dysphagia, unspecified: Secondary | ICD-10-CM | POA: Insufficient documentation

## 2016-08-03 DIAGNOSIS — Z79899 Other long term (current) drug therapy: Secondary | ICD-10-CM | POA: Diagnosis not present

## 2016-08-03 HISTORY — PX: ESOPHAGEAL MANOMETRY: SHX5429

## 2016-08-03 SURGERY — MANOMETRY, ESOPHAGUS

## 2016-08-03 MED ORDER — LIDOCAINE VISCOUS 2 % MT SOLN
OROMUCOSAL | Status: AC
  Filled 2016-08-03: qty 15

## 2016-08-03 SURGICAL SUPPLY — 2 items
FACESHIELD LNG OPTICON STERILE (SAFETY) IMPLANT
GLOVE BIO SURGEON STRL SZ8 (GLOVE) ×6 IMPLANT

## 2016-08-03 NOTE — Progress Notes (Signed)
When explaining to pt the esophageal Manometry and 24 PH impedance study, the pt stated that he was aware of having the manometry , but had not been told about the 24 hour pH impedance study. He stated that he did not feel like he had reflux and was not aware of this test. I called and spoke with Robyn from [facility] who spoke with Tammi Klippel PA who stated that she would like to have the 24 hour PH impedance test done that it would be helpful to her.   I told the pt the above what [facility] stated and explained the 24 PH with impedance test. The pt decided that he did not want to have the 24 PH impedance test done at this time.  We proceeded with the esophageal manometry as planned.  Esophageal Manometry done per protocol. Pt tolerated well without complication.  Report to be sent to Dr. Silverio Decamp.

## 2016-08-04 ENCOUNTER — Encounter (HOSPITAL_COMMUNITY): Payer: Self-pay | Admitting: Gastroenterology

## 2016-08-25 ENCOUNTER — Other Ambulatory Visit: Payer: Self-pay | Admitting: Student

## 2016-08-25 DIAGNOSIS — R5383 Other fatigue: Secondary | ICD-10-CM | POA: Diagnosis not present

## 2016-08-25 DIAGNOSIS — R1312 Dysphagia, oropharyngeal phase: Secondary | ICD-10-CM

## 2016-08-25 DIAGNOSIS — F458 Other somatoform disorders: Secondary | ICD-10-CM | POA: Diagnosis not present

## 2016-08-25 DIAGNOSIS — J439 Emphysema, unspecified: Secondary | ICD-10-CM | POA: Diagnosis not present

## 2016-08-26 DIAGNOSIS — R5383 Other fatigue: Secondary | ICD-10-CM | POA: Diagnosis not present

## 2016-08-30 ENCOUNTER — Telehealth: Payer: Self-pay

## 2016-08-30 NOTE — Telephone Encounter (Signed)
Received referral from [facility]. LM to schedule pt. Will await call back.

## 2016-09-01 NOTE — Telephone Encounter (Signed)
lmtcb X 2 

## 2016-09-06 NOTE — Telephone Encounter (Signed)
Patient returning call.  He wants a sooner appointment than our first available in [place].  Patient wants to call [facility] and think about options.  He says he will call our office when ready to schedule.

## 2016-09-16 ENCOUNTER — Ambulatory Visit: Payer: PPO

## 2016-09-22 ENCOUNTER — Ambulatory Visit: Admission: RE | Admit: 2016-09-22 | Payer: PPO | Source: Ambulatory Visit

## 2016-10-06 ENCOUNTER — Ambulatory Visit: Payer: PPO | Admitting: Urology

## 2016-10-06 ENCOUNTER — Encounter: Payer: Self-pay | Admitting: Urology

## 2016-10-06 VITALS — BP 129/71 | HR 55 | Ht 71.5 in | Wt 169.9 lb

## 2016-10-06 DIAGNOSIS — N400 Enlarged prostate without lower urinary tract symptoms: Secondary | ICD-10-CM | POA: Diagnosis not present

## 2016-10-06 LAB — BLADDER SCAN AMB NON-IMAGING: Scan Result: 196

## 2016-10-06 NOTE — Progress Notes (Signed)
10/06/2016 9:09 AM   Timothy Scott July 31, 1946 [id number]  Referring provider: Glean Hess, MD [address] [place], [place] [address]  Chief Complaint  Patient presents with  . Benign Prostatic Hypertrophy    HPI: The patient is a 71 year old gentleman with a past medical history of BPH on Flomax and finasteride presents for annual follow-up.  He has no complaints at this time. He is overall mostly satisfied with his urinary symptoms. He does know nocturia 2. Also has a feeling of incomplete bladder attaching, frequency, and urgency as his biggest complaints. He however is not bothered by this. His PVR is 21/2.    PVR: 196   PMH: Past Medical History:  Diagnosis Date  . Benign neoplasm of colon   . Chronic airway obstruction, not elsewhere classified   . COPD (chronic obstructive pulmonary disease) ([place])   . DDD (degenerative disc disease), cervical   . Degeneration of cervical intervertebral disc   . Dysphagia   . Dysuria   . 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 Lattice degeneration of peripheral retina   . Leg varices 03/10/2015  . Osteoarthrosis, unspecified whether generalized or localized, hand   . Posttraumatic stress disorder   . Sinus bradycardia 04/17/2014  . Varicose veins of bilateral lower extremities with other complications   . Wears dentures    partial upper and lower    Surgical History: Past Surgical History:  Procedure Laterality Date  . CATARACT EXTRACTION Right 2015  . CATARACT EXTRACTION W/PHACO Left 10/21/2015   Procedure: CATARACT EXTRACTION PHACO AND INTRAOCULAR LENS PLACEMENT (IOC);  Surgeon: Leandrew Koyanagi, MD;  Location: [facility];  Service: Ophthalmology;  Laterality: Left;  . COLONOSCOPY    . COLONOSCOPY  2014   benign polyps  . CYSTOSCOPY    . ESOPHAGEAL MANOMETRY N/A 08/03/2016   Procedure: ESOPHAGEAL MANOMETRY (EM);  Surgeon: Mauri Pole, MD;  Location: [facility]  ENDOSCOPY;  Service: Endoscopy;  Laterality: N/A;  . ESOPHAGOGASTRODUODENOSCOPY (EGD) WITH PROPOFOL N/A 02/17/2016   with esoph dilatation  . TONSILLECTOMY      Home Medications:  Allergies as of 10/06/2016   No Known Allergies     Medication List       Accurate as of 10/06/16  9:09 AM. Always use your most recent med list.          azelastine 0.1 % nasal spray Commonly known as:  ASTELIN   CALCIUM 1200 PO Take 1 mg by mouth.   Calcium Carb-Cholecalciferol 600-800 MG-UNIT Chew Chew by mouth.   finasteride 5 MG tablet Commonly known as:  PROSCAR Take 5 mg by mouth daily.   fluticasone 50 MCG/ACT nasal spray Commonly known as:  FLONASE Place 2 sprays into both nostrils daily. Reported on 03/01/2016   magnesium oxide 400 MG tablet Commonly known as:  MAG-OX Take 400 mg by mouth daily.   naproxen 500 MG tablet Commonly known as:  NAPROSYN Take 500 mg by mouth 2 (two) times daily with a meal.   omeprazole 40 MG capsule Commonly known as:  PRILOSEC Take 40 mg by mouth daily.   PROBIOTIC DAILY PO Take by mouth.   Saw Palmetto 450 MG Caps Take by mouth daily.   tamsulosin 0.4 MG Caps capsule Commonly known as:  FLOMAX Take 0.4 mg by mouth.   traZODone 50 MG tablet Commonly known as:  DESYREL Take 50 mg by mouth at bedtime.   Turmeric 500 MG Tabs Take by mouth  daily.       Allergies: No Known Allergies  Family History: Family History  Problem Relation Age of Onset  . Hypertension Mother   . Prostate cancer Neg Hx     Social History:  reports that he quit smoking about 30 years ago. He has never used smokeless tobacco. He reports that he drinks about 8.4 oz of alcohol per week . He reports that he does not use drugs.  ROS: UROLOGY Frequent Urination?: No Hard to postpone urination?: No Burning/pain with urination?: No Get up at night to urinate?: No Leakage of urine?: No Urine stream starts and stops?: No Trouble starting stream?: No Do you have  to strain to urinate?: No Blood in urine?: No Urinary tract infection?: No Sexually transmitted disease?: No Injury to kidneys or bladder?: No Painful intercourse?: No Weak stream?: No Erection problems?: No Penile pain?: No  Gastrointestinal Nausea?: No Vomiting?: No Indigestion/heartburn?: No Diarrhea?: No Constipation?: No  Constitutional Fever: No Night sweats?: No Weight loss?: No Fatigue?: No  Skin Skin rash/lesions?: No Itching?: No  Eyes Blurred vision?: No Double vision?: No  Ears/Nose/Throat Sore throat?: Yes Sinus problems?: No  Hematologic/Lymphatic Swollen glands?: Yes Easy bruising?: No  Cardiovascular Leg swelling?: No Chest pain?: No  Respiratory Cough?: No Shortness of breath?: No  Endocrine Excessive thirst?: No  Musculoskeletal Back pain?: No Joint pain?: No  Neurological Headaches?: No Dizziness?: No  Psychologic Depression?: No Anxiety?: No  Physical Exam: BP 129/71 (BP Location: Left Arm, Patient Position: Sitting, Cuff Size: Normal)   Pulse (!) 55   Ht 5' 11.5" (1.816 m)   Wt 169 lb 14.4 oz (77.1 kg)   BMI 23.37 kg/m   Constitutional:  Alert and oriented, No acute distress. HEENT: [place] AT, moist mucus membranes.  Trachea midline, no masses. Cardiovascular: No clubbing, cyanosis, or edema. Respiratory: Normal respiratory effort, no increased work of breathing. GI: Abdomen is soft, nontender, nondistended, no abdominal masses GU: No CVA tenderness. DRE 2+ benign Skin: No rashes, bruises or suspicious lesions. Lymph: No cervical or inguinal adenopathy. Neurologic: Grossly intact, no focal deficits, moving all 4 extremities. Psychiatric: Normal mood and affect.  Laboratory Data: Lab Results  Component Value Date   WBC 7.3 04/05/2016   HCT 42.0 04/05/2016   MCV 95 04/05/2016   PLT 297 04/05/2016    Lab Results  Component Value Date   CREATININE 0.79 04/05/2016    No results found for: PSA  No results found  for: TESTOSTERONE  Lab Results  Component Value Date   HGBA1C 5.5 04/05/2016    Urinalysis    Component Value Date/Time   APPEARANCEUR Clear 10/05/2015 0909   GLUCOSEU Negative 10/05/2015 0909   BILIRUBINUR Negative 10/05/2015 0909   PROTEINUR Negative 10/05/2015 0909   NITRITE Negative 10/05/2015 0909   LEUKOCYTESUR 1+ (A) 10/05/2015 0909      Assessment & Plan:    1. BPH -Continue Flomax and finasteride -Follow-up in one year or sooner symptoms worsen  Return in about 1 year (around 10/06/2017).  Nickie Retort, MD  [facility] [address], [place], [place] [address] [phone]

## 2016-10-10 ENCOUNTER — Ambulatory Visit
Admission: RE | Admit: 2016-10-10 | Discharge: 2016-10-10 | Disposition: A | Discharge status: Home / Self Care | Payer: PPO | Source: Ambulatory Visit | Attending: Student | Admitting: Student

## 2016-10-10 ENCOUNTER — Other Ambulatory Visit: Payer: Self-pay | Admitting: Student

## 2016-10-10 DIAGNOSIS — R1312 Dysphagia, oropharyngeal phase: Secondary | ICD-10-CM

## 2016-10-10 NOTE — Therapy (Signed)
[place], [place], [address] Phone: [phone]   Fax:     Modified Barium Swallow  Patient Details  Name: Timothy Scott MRN: [id number] Date of Birth: [birth year] No Data Recorded  Encounter Date: 10/10/2016      End of Session - 10/10/16 1339    Visit Number 1   Number of Visits 1   Date for SLP Re-Evaluation 10/10/16   SLP Start Time 1245   SLP Stop Time  1339   SLP Time Calculation (min) 54 min   Activity Tolerance Patient tolerated treatment well      Past Medical History:  Diagnosis Date  . Benign neoplasm of colon   . Chronic airway obstruction, not elsewhere classified   . COPD (chronic obstructive pulmonary disease) ([place])   . DDD (degenerative disc disease), cervical   . Degeneration of cervical intervertebral disc   . Dysphagia   . Dysuria   . 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 Lattice degeneration of peripheral retina   . Leg varices 03/10/2015  . Osteoarthrosis, unspecified whether generalized or localized, hand   . Posttraumatic stress disorder   . Sinus bradycardia 04/17/2014  . Varicose veins of bilateral lower extremities with other complications   . Wears dentures    partial upper and lower    Past Surgical History:  Procedure Laterality Date  . CATARACT EXTRACTION Right 2015  . CATARACT EXTRACTION W/PHACO Left 10/21/2015   Procedure: CATARACT EXTRACTION PHACO AND INTRAOCULAR LENS PLACEMENT (IOC);  Surgeon: Leandrew Koyanagi, MD;  Location: [facility];  Service: Ophthalmology;  Laterality: Left;  . COLONOSCOPY    . COLONOSCOPY  2014   benign polyps  . CYSTOSCOPY    . ESOPHAGEAL MANOMETRY N/A 08/03/2016   Procedure: ESOPHAGEAL MANOMETRY (EM);  Surgeon: Mauri Pole, MD;  Location: [facility];  Service: Endoscopy;  Laterality: N/A;  . ESOPHAGOGASTRODUODENOSCOPY (EGD) WITH PROPOFOL N/A 02/17/2016   with esoph  dilatation  . TONSILLECTOMY      There were no vitals filed for this visit.   Subjective: Patient behavior: (alertness, ability to follow instructions, etc.): The patient is able to follow directions and verbalize his complaints  Chief complaint: Globus sensation. Previous testing (all normal): barium swallow study 01/2016, endoscopy 02/2016, and esophageal manometry 07/2016.   Objective:  Radiological Procedure: A videoflouroscopic evaluation of oral-preparatory, reflex initiation, and pharyngeal phases of the swallow was performed; as well as a screening of the upper esophageal phase.  I. POSTURE: Upright in MBS chair  II. VIEW: Lateral  III. COMPENSATORY STRATEGIES: N/A  IV. BOLUSES ADMINISTERED:   Thin Liquid: 2 cup rim sips   Nectar-thick Liquid: 1 moderate size bolus    Puree: 1 heaping teaspoon   Mechanical Soft: 1/4 graham cracker in applesauce  V. RESULTS OF EVALUATION: A. ORAL PREPARATORY PHASE: (The lips, tongue, and velum are observed for strength and coordination)       **Overall Severity Rating: Within normal limits  B. SWALLOW INITIATION/REFLEX: (The reflex is normal if "triggered" by the time the bolus reached the base of the tongue)  **Overall Severity Rating: Minimal; triggers at the valleculae  C. PHARYNGEAL PHASE: (Pharyngeal function is normal if the bolus shows rapid, smooth, and continuous transit through the pharynx and there is no pharyngeal residue after the swallow)  **Overall Severity Rating: Minimal; slight reduction in anterior hyoid movement, reduced tongue base retraction; incomplete epiglottic inversion.  Trace-to-mild vallecular residue  D. LARYNGEAL PENETRATION: (Material entering into the laryngeal inlet/vestibule but not aspirated) None  E. ASPIRATION: None  F. ESOPHAGEAL PHASE: (Screening of the upper esophagus): Marland Kitchen  In the cervical esophagus there is slight narrowing impingement by osteophytes.  However all boluses flow easily through  the cervical esophagus.    ASSESSMENT: This 71 year old man; with persistent globus sensation despite normal barium swallow, endoscopy, and manometry; is presenting with minimal oropharyngeal dysphagia.  Oral control of the bolus including oral hold, rotary mastication, and anterior to posterior transfer are within normal limits. Timing of the pharyngeal swallow is delayed, triggering at the valleculae.  In the pharyngeal stage of swallowing there is minimally reduced tongue base retraction, anterior hyolaryngeal excursion, and epiglottic inversion.  There is trace-to mild vallecular residue.  There was no observed laryngeal penetration or tracheal aspiration.  In the cervical esophagus there is slight narrowing impingement by osteophytes.  However all boluses flow easily through the cervical esophagus.  The patient was counseled that his swallowing is safe.  He was counseled to stretch extrinsic laryngeal muscles to help ease any discomfort caused by excessive muscle tension.  PLAN/RECOMMENDATIONS:   A. Diet: Regular   B. Swallowing Precautions: No particular swallowing precautions required   C. Recommended consultation to: follow up with MDs as recommended   D. Therapy recommendations: N/A   E. Results and recommendations were discussed with the patient immediately following the study and the final report routed to the referring practitioner.    Oropharyngeal dysphagia - Plan: DG OP Swallowing Func-Medicare/Speech Path, DG OP Swallowing Func-Medicare/Speech Path, CANCELED: DG SWALLOWING FUNC-SPEECH PATHOLOGY, CANCELED: DG SWALLOWING FUNC-SPEECH PATHOLOGY      G-Codes - 11-07-16 1340    Functional Assessment Tool Used MBS, clinical judgment   Functional Limitations Swallowing   Swallow Current Status [id number]) At least 1 percent but less than 20 percent impaired, limited or restricted   Swallow Goal Status [id number]) At least 1 percent but less than 20 percent impaired, limited or restricted    Swallow Discharge Status [phone]) At least 1 percent but less than 20 percent impaired, limited or restricted          Problem List Patient Active Problem List   Diagnosis Date Noted  . Anosmia 05/05/2016  . Dysphagia 02/03/2016  . Tobacco use disorder, moderate, in sustained remission 03/11/2015  . Lumbar disc herniation with radiculopathy 03/11/2015  . Gonalgia 03/10/2015  . DDD (degenerative disc disease), cervical 03/10/2015  . Difficult or painful urination 03/10/2015  . COPD, mild ([place]) 03/10/2015  . Degenerative arthritis of finger 03/10/2015  . Post-traumatic stress disorder 03/10/2015  . Lattice degeneration 03/10/2015  . Leg varices 03/10/2015  . Sinus bradycardia 04/17/2014   Leroy Sea, MS/CCC- SLP  Lou Miner Nov 07, 2016, 1:40 PM  [facility] [place], [place], [address] Phone: [phone]   Fax:     Name: Timothy Scott MRN: [id number] Date of Birth: 1946-04-08

## 2016-10-20 ENCOUNTER — Institutional Professional Consult (permissible substitution): Payer: PPO | Admitting: Internal Medicine

## 2016-10-26 ENCOUNTER — Ambulatory Visit (INDEPENDENT_AMBULATORY_CARE_PROVIDER_SITE_OTHER): Payer: PPO | Admitting: Internal Medicine

## 2016-10-26 ENCOUNTER — Encounter: Payer: Self-pay | Admitting: Internal Medicine

## 2016-10-26 VITALS — BP 122/60 | HR 60 | Resp 12 | Ht 71.5 in | Wt 171.0 lb

## 2016-10-26 DIAGNOSIS — M47812 Spondylosis without myelopathy or radiculopathy, cervical region: Secondary | ICD-10-CM | POA: Diagnosis not present

## 2016-10-26 DIAGNOSIS — R1319 Other dysphagia: Secondary | ICD-10-CM

## 2016-10-26 NOTE — Progress Notes (Signed)
Date:  10/26/2016   Name:  Timothy Scott   DOB:  10-Sep-1945   MRN:  [id number]   Chief Complaint: Follow-up (Pt wants consult to talk about throat. Wants to find out next step?) He has had almost complete evaluation.  The last test was a swallowing exam - the tech felt that there was some extrinsic compression on the posterior pharynx from cervical spurring.  His sx of globus sensation continues and may be slightly worse.  No choking or vomiting. He has mild stiffness of his neck and occasional muscle spasm and discomfort.  No upper arm sx - numbness or weakness.  Review of Systems  Constitutional: Negative for appetite change, fatigue and unexpected weight change.  HENT: Positive for trouble swallowing (globus sensation).   Eyes: Negative for visual disturbance.  Respiratory: Negative for cough, shortness of breath and wheezing.   Cardiovascular: Negative for chest pain, palpitations and leg swelling.  Gastrointestinal: Negative for abdominal pain and blood in stool.  Endocrine: Negative for polydipsia and polyuria.  Genitourinary: Negative for dysuria and hematuria.  Skin: Negative for color change and rash.  Neurological: Negative for tremors, numbness and headaches.  Psychiatric/Behavioral: Negative for dysphoric mood.    Patient Active Problem List   Diagnosis Date Noted  . Anosmia 05/05/2016  . Dysphagia 02/03/2016  . Tobacco use disorder, moderate, in sustained remission 03/11/2015  . Lumbar disc herniation with radiculopathy 03/11/2015  . Gonalgia 03/10/2015  . DDD (degenerative disc disease), cervical 03/10/2015  . Difficult or painful urination 03/10/2015  . COPD, mild ([place]) 03/10/2015  . Degenerative arthritis of finger 03/10/2015  . Post-traumatic stress disorder 03/10/2015  . Lattice degeneration 03/10/2015  . Leg varices 03/10/2015  . Sinus bradycardia 04/17/2014    Prior to Admission medications   Medication Sig Start Date End Date Taking? Authorizing  Provider  azelastine (ASTELIN) 0.1 % nasal spray  11/18/15  Yes Historical Provider, MD  Calcium Carb-Cholecalciferol 600-800 MG-UNIT CHEW Chew by mouth.   Yes Historical Provider, MD  Calcium Carbonate-Vit D-Min (CALCIUM 1200 PO) Take 1 mg by mouth.   Yes Historical Provider, MD  finasteride (PROSCAR) 5 MG tablet Take 5 mg by mouth daily.   Yes Historical Provider, MD  fluticasone (FLONASE) 50 MCG/ACT nasal spray Place 2 sprays into both nostrils daily. Reported on 03/01/2016   Yes Historical Provider, MD  magnesium oxide (MAG-OX) 400 MG tablet Take 400 mg by mouth daily.   Yes Historical Provider, MD  naproxen (NAPROSYN) 500 MG tablet Take 500 mg by mouth 2 (two) times daily with a meal.   Yes Historical Provider, MD  omeprazole (PRILOSEC) 40 MG capsule Take 40 mg by mouth daily.   Yes Historical Provider, MD  Probiotic Product (PROBIOTIC DAILY PO) Take by mouth.   Yes Historical Provider, MD  Saw Palmetto 450 MG CAPS Take by mouth daily.   Yes Historical Provider, MD  tamsulosin (FLOMAX) 0.4 MG CAPS capsule Take 0.4 mg by mouth.   Yes Historical Provider, MD  traZODone (DESYREL) 50 MG tablet Take 50 mg by mouth at bedtime.   Yes Historical Provider, MD  Turmeric 500 MG TABS Take by mouth daily.   Yes Historical Provider, MD    No Known Allergies  Past Surgical History:  Procedure Laterality Date  . CATARACT EXTRACTION Right 2015  . CATARACT EXTRACTION W/PHACO Left 10/21/2015   Procedure: CATARACT EXTRACTION PHACO AND INTRAOCULAR LENS PLACEMENT (IOC);  Surgeon: Leandrew Koyanagi, MD;  Location: [facility];  Service: Ophthalmology;  Laterality: Left;  . COLONOSCOPY    . COLONOSCOPY  2014   benign polyps  . CYSTOSCOPY    . ESOPHAGEAL MANOMETRY N/A 08/03/2016   Procedure: ESOPHAGEAL MANOMETRY (EM);  Surgeon: Mauri Pole, MD;  Location: [facility];  Service: Endoscopy;  Laterality: N/A;  . ESOPHAGOGASTRODUODENOSCOPY (EGD) WITH PROPOFOL N/A 02/17/2016   with esoph  dilatation  . TONSILLECTOMY      Social History  Substance Use Topics  . Smoking status: Former Smoker    Quit date: 04/05/1986  . Smokeless tobacco: Never Used     Comment: quit 25-30 yrs ago  . Alcohol use 8.4 oz/week    14 Standard drinks or equivalent per week     Comment: socially     Medication list has been reviewed and updated.   Physical Exam  Constitutional: He is oriented to person, place, and time. He appears well-developed. No distress.  HENT:  Head: Normocephalic and atraumatic.  Cardiovascular: Normal rate, regular rhythm and normal heart sounds.   Pulmonary/Chest: Effort normal and breath sounds normal. No respiratory distress.  Musculoskeletal: Normal range of motion.       Cervical back: He exhibits spasm. He exhibits normal range of motion.  Neurological: He is alert and oriented to person, place, and time. He displays no atrophy. No sensory deficit. He exhibits normal muscle tone. Gait normal.  Reflex Scores:      Bicep reflexes are 1+ on the right side and 1+ on the left side. Skin: Skin is warm and dry. No rash noted.  Psychiatric: He has a normal mood and affect. His behavior is normal. Thought content normal.  Nursing note and vitals reviewed.   BP 122/60   Pulse 60   Resp 12   Ht 5' 11.5" (1.816 m)   Wt 171 lb (77.6 kg)   BMI 23.52 kg/m   Assessment and Plan: 1. Other dysphagia Review MRI to see if there is a surgical problem - MR Cervical Spine Wo Contrast; Future  2. Osteoarthritis of cervical spine, unspecified spinal osteoarthritis complication status Consider PT if no surgical option - MR Cervical Spine Wo Contrast; Future    Halina Maidens, MD [facility]  10/26/2016

## 2016-10-27 ENCOUNTER — Ambulatory Visit: Payer: PPO | Admitting: Internal Medicine

## 2016-11-04 ENCOUNTER — Ambulatory Visit: Admission: RE | Admit: 2016-11-04 | Payer: PPO | Source: Ambulatory Visit

## 2016-11-17 ENCOUNTER — Ambulatory Visit
Admission: RE | Admit: 2016-11-17 | Discharge: 2016-11-17 | Disposition: A | Discharge status: Home / Self Care | Payer: PPO | Source: Ambulatory Visit | Attending: Internal Medicine | Admitting: Internal Medicine

## 2016-11-17 DIAGNOSIS — M4802 Spinal stenosis, cervical region: Secondary | ICD-10-CM | POA: Diagnosis not present

## 2016-11-17 DIAGNOSIS — M5031 Other cervical disc degeneration,  high cervical region: Secondary | ICD-10-CM | POA: Diagnosis not present

## 2016-11-17 DIAGNOSIS — R1319 Other dysphagia: Secondary | ICD-10-CM | POA: Diagnosis not present

## 2016-11-17 DIAGNOSIS — M47812 Spondylosis without myelopathy or radiculopathy, cervical region: Secondary | ICD-10-CM | POA: Diagnosis not present

## 2016-11-17 DIAGNOSIS — M50822 Other cervical disc disorders at C5-C6 level: Secondary | ICD-10-CM | POA: Diagnosis not present

## 2016-12-12 ENCOUNTER — Encounter: Payer: Self-pay | Admitting: Internal Medicine

## 2016-12-12 ENCOUNTER — Ambulatory Visit (INDEPENDENT_AMBULATORY_CARE_PROVIDER_SITE_OTHER): Payer: PPO | Admitting: Internal Medicine

## 2016-12-12 VITALS — BP 120/62 | HR 52 | Ht 71.5 in | Wt 170.0 lb

## 2016-12-12 DIAGNOSIS — R42 Dizziness and giddiness: Secondary | ICD-10-CM | POA: Insufficient documentation

## 2016-12-12 DIAGNOSIS — M5030 Other cervical disc degeneration, unspecified cervical region: Secondary | ICD-10-CM

## 2016-12-12 DIAGNOSIS — R1319 Other dysphagia: Secondary | ICD-10-CM | POA: Diagnosis not present

## 2016-12-12 MED ORDER — MECLIZINE HCL 25 MG PO TABS: 25.0000 mg | ORAL_TABLET | Freq: Every day | ORAL | 0 refills | Status: DC

## 2016-12-12 NOTE — Progress Notes (Signed)
Date:  12/12/2016   Name:  Timothy Scott   DOB:  07-Aug-1946   MRN:  [id number]   Chief Complaint: Dysphagia (Spine doctor in [place] thinks he should get injections in neck to give relief.- Dr. Gordy Clement in Neurology. Pt states its out of network so will get set up himself. No refferal needed. ) and Dizziness (Pt complaining of dizziness and low energy. Feels so dizzy he can pass out at times. Also complaining of numbness and tingling in left arm. he knows his heart rate is normally low but he is concerned about possible low bp or low bs.) Dizziness  This is a new problem. The current episode started 1 to 4 weeks ago. The problem occurs intermittently. The problem has been unchanged. Associated symptoms include arthralgias, fatigue, numbness (tingling in left arm from next to elbow intermittently) and weakness. Pertinent negatives include no chest pain, coughing, diaphoresis, headaches, nausea, neck pain, visual change or vomiting. He has tried rest for the symptoms.   Dysphagia - has had completed workup with no cause found other than very mild anterior C-spine pressure on posterior pharynx.  Planning to see a Neurologist in [place] for consultation.  IMPRESSION: 1. C5-6 left paracentral endplate spur exerts the greatest mass effect on the esophagus. There was no associated obstruction or stasis on swallowing function study 10/10/2016 and relationship to globus sensation is uncertain. 2. Degenerative foraminal narrowing most notable at C3-4 and C4-5 bilaterally. 3. Diffusely patent spinal canal.   Review of Systems  Constitutional: Positive for fatigue. Negative for diaphoresis.  HENT: Positive for trouble swallowing (globus sensation).   Eyes: Negative for visual disturbance.  Respiratory: Negative for cough, chest tightness and shortness of breath.   Cardiovascular: Negative for chest pain, palpitations and leg swelling.  Gastrointestinal: Negative for nausea and vomiting.    Musculoskeletal: Positive for arthralgias and neck stiffness. Negative for neck pain.  Neurological: Positive for dizziness, weakness and numbness (tingling in left arm from next to elbow intermittently). Negative for seizures, syncope, speech difficulty and headaches.    Patient Active Problem List   Diagnosis Date Noted  . Anosmia 05/05/2016  . Dysphagia 02/03/2016  . Tobacco use disorder, moderate, in sustained remission 03/11/2015  . Lumbar disc herniation with radiculopathy 03/11/2015  . Gonalgia 03/10/2015  . DDD (degenerative disc disease), cervical 03/10/2015  . Difficult or painful urination 03/10/2015  . COPD, mild ([place]) 03/10/2015  . Degenerative arthritis of finger 03/10/2015  . Post-traumatic stress disorder 03/10/2015  . Lattice degeneration 03/10/2015  . Leg varices 03/10/2015  . Sinus bradycardia 04/17/2014    Prior to Admission medications   Medication Sig Start Date End Date Taking? Authorizing Provider  azelastine (ASTELIN) 0.1 % nasal spray  11/18/15  Yes Historical Provider, MD  Calcium Carb-Cholecalciferol 600-800 MG-UNIT CHEW Chew by mouth.   Yes Historical Provider, MD  Calcium Carbonate-Vit D-Min (CALCIUM 1200 PO) Take 1 mg by mouth.   Yes Historical Provider, MD  finasteride (PROSCAR) 5 MG tablet Take 5 mg by mouth daily.   Yes Historical Provider, MD  fluticasone (FLONASE) 50 MCG/ACT nasal spray Place 2 sprays into both nostrils daily. Reported on 03/01/2016   Yes Historical Provider, MD  magnesium oxide (MAG-OX) 400 MG tablet Take 400 mg by mouth daily.   Yes Historical Provider, MD  naproxen (NAPROSYN) 500 MG tablet Take 500 mg by mouth 2 (two) times daily with a meal.   Yes Historical Provider, MD  omeprazole (PRILOSEC) 40 MG capsule Take 40 mg  by mouth daily.   Yes Historical Provider, MD  Probiotic Product (PROBIOTIC DAILY PO) Take by mouth.   Yes Historical Provider, MD  Saw Palmetto 450 MG CAPS Take by mouth daily.   Yes Historical Provider, MD   tamsulosin (FLOMAX) 0.4 MG CAPS capsule Take 0.4 mg by mouth.   Yes Historical Provider, MD  traZODone (DESYREL) 50 MG tablet Take 50 mg by mouth at bedtime.   Yes Historical Provider, MD  Turmeric 500 MG TABS Take by mouth daily.   Yes Historical Provider, MD    No Known Allergies  Past Surgical History:  Procedure Laterality Date  . CATARACT EXTRACTION Right 2015  . CATARACT EXTRACTION W/PHACO Left 10/21/2015   Procedure: CATARACT EXTRACTION PHACO AND INTRAOCULAR LENS PLACEMENT (IOC);  Surgeon: Leandrew Koyanagi, MD;  Location: [facility];  Service: Ophthalmology;  Laterality: Left;  . COLONOSCOPY    . COLONOSCOPY  2014   benign polyps  . CYSTOSCOPY    . ESOPHAGEAL MANOMETRY N/A 08/03/2016   Procedure: ESOPHAGEAL MANOMETRY (EM);  Surgeon: Mauri Pole, MD;  [facility];  Service: Endoscopy;  Laterality: N/A;  . ESOPHAGOGASTRODUODENOSCOPY (EGD) WITH PROPOFOL N/A 02/17/2016   with esoph dilatation  . TONSILLECTOMY      Social History  Substance Use Topics  . Smoking status: Former Smoker    Quit date: 04/05/1986  . Smokeless tobacco: Never Used     Comment: quit 25-30 yrs ago  . Alcohol use 8.4 oz/week    14 Standard drinks or equivalent per week     Comment: socially     Medication list has been reviewed and updated.   Physical Exam  Constitutional: He is oriented to person, place, and time. He appears well-developed. No distress.  HENT:  Head: Normocephalic and atraumatic.  Right Ear: Tympanic membrane and ear canal normal.  Left Ear: Tympanic membrane and ear canal normal.  Nose: Right sinus exhibits no maxillary sinus tenderness. Left sinus exhibits no maxillary sinus tenderness.  Mouth/Throat: No posterior oropharyngeal erythema.  Eyes: Conjunctivae are normal. Pupils are equal, round, and reactive to light. Right eye exhibits nystagmus. Left eye exhibits nystagmus.  Cardiovascular: Normal rate, regular rhythm and normal heart sounds.    Pulmonary/Chest: Effort normal. No respiratory distress.  Musculoskeletal: Normal range of motion.  Neurological: He is alert and oriented to person, place, and time. He has normal strength and normal reflexes. No sensory deficit.  Skin: Skin is warm and dry. No rash noted.  Psychiatric: He has a normal mood and affect. His behavior is normal. Thought content normal.  Nursing note and vitals reviewed.   BP 120/62 (BP Location: Right Arm, Patient Position: Sitting, Cuff Size: Normal)   Pulse (!) 52   Ht 5' 11.5" (1.816 m)   Wt 170 lb (77.1 kg)   SpO2 97%   BMI 23.38 kg/m   Assessment and Plan: 1. Vertigo Take antivert 25 mg every evening for one week If persistent sx - refer to ENT MRI and CT already done would exclude intra-cranial pathology - meclizine (ANTIVERT) 25 MG tablet; Take 1 tablet (25 mg total) by mouth daily after supper.  Dispense: 14 tablet; Refill: 0  2. Other dysphagia See Neurology  3. DDD (degenerative disc disease), cervical LUE sx may be related to cervical disc Proceed with Neuro evaluation   Meds ordered this encounter  Medications  . meclizine (ANTIVERT) 25 MG tablet    Sig: Take 1 tablet (25 mg total) by mouth daily after supper.  Dispense:  14 tablet    Refill:  0    Halina Maidens, MD [facility]  12/12/2016

## 2017-01-03 NOTE — Progress Notes (Signed)
01/04/2017 10:54 AM   Timothy Scott Feb 02, 1946 [id number]  Referring provider: Glean Hess, MD [address] [place], [place] [address]  Chief Complaint  Patient presents with  . Prostatitis    possible patient last seen 02/18    HPI: 71 yo WM who presents today for an appointment for possible prostatitis.  BPH WITH LUTS His IPSS score today is 17, which is moderate lower urinary tract symptomatology. He is unhappy with his quality life due to his urinary symptoms. His PVR is 29 mL.  His previous IPSS score was 21/2.  His previous PVR is 196 mL.  His major complaints today are frequency x q 30 minutes, urgency, burning at the tip of the penis and nocturia x 3.  His UA is unremarkable.  He has had these symptoms for the last few weeks.  He denies any dysuria, hematuria or suprapubic pain.  He currently taking tamsulosin 0.4 mg and finasteride 5 mg daily.  His has had prostate biopsies in the remote past which were negative.  He also denies any recent fevers, chills, nausea or vomiting.  He does not have a family history of PCa.  No pain with BM's, constipation or diarrhea.  No pain with ejaculation.        IPSS    Row Name 01/04/17 1000         International Prostate Symptom Score   How often have you had the sensation of not emptying your bladder? About half the time     How often have you had to urinate less than every two hours? About half the time     How often have you found you stopped and started again several times when you urinated? Less than 1 in 5 times     How often have you found it difficult to postpone urination? More than half the time     How often have you had a weak urinary stream? About half the time     How often have you had to strain to start urination? Not at All     How many times did you typically get up at night to urinate? 3 Times     Total IPSS Score 17       Quality of Life due to urinary symptoms   If you were to spend the rest  of your life with your urinary condition just the way it is now how would you feel about that? Unhappy        Score:  1-7 Mild 8-19 Moderate 20-35 Severe    PMH: Past Medical History:  Diagnosis Date  . Benign neoplasm of colon   . Chronic airway obstruction, not elsewhere classified   . COPD (chronic obstructive pulmonary disease) ([place])   . DDD (degenerative disc disease), cervical   . Degeneration of cervical intervertebral disc   . Dysphagia   . Dysuria   . 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 Lattice degeneration of peripheral retina   . Leg varices 03/10/2015  . Osteoarthrosis, unspecified whether generalized or localized, hand   . Posttraumatic stress disorder   . Sinus bradycardia 04/17/2014  . Varicose veins of bilateral lower extremities with other complications   . Wears dentures    partial upper and lower    Surgical History: Past Surgical History:  Procedure Laterality Date  . CATARACT EXTRACTION Right 2015  . CATARACT EXTRACTION W/PHACO Left 10/21/2015   Procedure: CATARACT EXTRACTION  PHACO AND INTRAOCULAR LENS PLACEMENT (IOC);  Surgeon: Leandrew Koyanagi, MD;  Location: [facility];  Service: Ophthalmology;  Laterality: Left;  . COLONOSCOPY    . COLONOSCOPY  2014   benign polyps  . CYSTOSCOPY    . ESOPHAGEAL MANOMETRY N/A 08/03/2016   Procedure: ESOPHAGEAL MANOMETRY (EM);  Surgeon: Mauri Pole, MD;  Location: [facility];  Service: Endoscopy;  Laterality: N/A;  . ESOPHAGOGASTRODUODENOSCOPY (EGD) WITH PROPOFOL N/A 02/17/2016   with esoph dilatation  . TONSILLECTOMY      Home Medications:  Allergies as of 01/04/2017   No Known Allergies     Medication List       Accurate as of 01/04/17 10:54 AM. Always use your most recent med list.          azelastine 0.1 % nasal spray Commonly known as:  ASTELIN   CALCIUM 1200 PO Take 1 mg by mouth.   Calcium Carb-Cholecalciferol 600-800 MG-UNIT Chew Chew by  mouth.   finasteride 5 MG tablet Commonly known as:  PROSCAR Take 5 mg by mouth daily.   fluticasone 50 MCG/ACT nasal spray Commonly known as:  FLONASE Place 2 sprays into both nostrils daily. Reported on 03/01/2016   magnesium oxide 400 MG tablet Commonly known as:  MAG-OX Take 400 mg by mouth daily.   meclizine 25 MG tablet Commonly known as:  ANTIVERT Take 1 tablet (25 mg total) by mouth daily after supper.   naproxen 500 MG tablet Commonly known as:  NAPROSYN Take 500 mg by mouth 2 (two) times daily with a meal.   omeprazole 40 MG capsule Commonly known as:  PRILOSEC Take 40 mg by mouth daily.   PROBIOTIC DAILY PO Take by mouth.   Saw Palmetto 450 MG Caps Take by mouth daily.   tamsulosin 0.4 MG Caps capsule Commonly known as:  FLOMAX Take 0.4 mg by mouth.   traZODone 50 MG tablet Commonly known as:  DESYREL Take 50 mg by mouth at bedtime.   Turmeric 500 MG Tabs Take by mouth daily.   UNABLE TO FIND [facility]       Allergies: No Known Allergies  Family History: Family History  Problem Relation Age of Onset  . Hypertension Mother   . Prostate cancer Neg Hx   . Kidney cancer Neg Hx   . Bladder Cancer Neg Hx     Social History:  reports that he quit smoking about 30 years ago. He has never used smokeless tobacco. He reports that he drinks about 8.4 oz of alcohol per week . He reports that he does not use drugs.  ROS: UROLOGY Frequent Urination?: Yes Hard to postpone urination?: Yes Burning/pain with urination?: Yes Get up at night to urinate?: Yes Leakage of urine?: No Urine stream starts and stops?: No Trouble starting stream?: No Do you have to strain to urinate?: No Blood in urine?: No Urinary tract infection?: No Sexually transmitted disease?: No Injury to kidneys or bladder?: No Painful intercourse?: No Weak stream?: No Erection problems?: No Penile pain?: No  Gastrointestinal Nausea?: No Vomiting?:  No Indigestion/heartburn?: No Diarrhea?: No Constipation?: No  Constitutional Fever: No Night sweats?: No Weight loss?: No Fatigue?: No  Skin Skin rash/lesions?: No Itching?: No  Eyes Blurred vision?: No Double vision?: No  Ears/Nose/Throat Sore throat?: No Sinus problems?: No  Hematologic/Lymphatic Swollen glands?: No Easy bruising?: No  Cardiovascular Leg swelling?: No Chest pain?: No  Respiratory Cough?: No Shortness of breath?: No  Endocrine Excessive thirst?: No  Musculoskeletal Back pain?: No Joint pain?: No  Neurological Headaches?: No Dizziness?: No  Psychologic Depression?: No Anxiety?: No  Physical Exam: BP 126/65   Pulse 65   Ht 5' 11.5" (1.816 m)   Wt 169 lb 12.8 oz (77 kg)   BMI 23.35 kg/m   Constitutional: Well nourished. Alert and oriented, No acute distress. HEENT: [place] AT, moist mucus membranes. Trachea midline, no masses. Cardiovascular: No clubbing, cyanosis, or edema. Respiratory: Normal respiratory effort, no increased work of breathing. GI: Abdomen is soft, non tender, non distended, no abdominal masses. Liver and spleen not palpable.  No hernias appreciated.  Stool sample for occult testing is not indicated.   GU: No CVA tenderness.  No bladder fullness or masses.  Patient with circumcised phallus.   Urethral meatus is patent.  No penile discharge. No penile lesions or rashes. Scrotum without lesions, cysts, rashes and/or edema.  Testicles are located scrotally bilaterally. No masses are appreciated in the testicles. Left and right epididymis are normal. Rectal: Patient with  normal sphincter tone. Anus and perineum without scarring or rashes. No rectal masses are appreciated. Prostate is approximately 45 grams, no nodules are appreciated. Seminal vesicles are normal. Skin: No rashes, bruises or suspicious lesions. Lymph: No cervical or inguinal adenopathy. Neurologic: Grossly intact, no focal deficits, moving all 4  extremities. Psychiatric: Normal mood and affect.  Laboratory Data: Lab Results  Component Value Date   WBC 7.3 04/05/2016   HCT 42.0 04/05/2016   MCV 95 04/05/2016   PLT 297 04/05/2016    Lab Results  Component Value Date   CREATININE 0.79 04/05/2016     Lab Results  Component Value Date   HGBA1C 5.5 04/05/2016    Lab Results  Component Value Date   TSH 2.920 04/05/2016    Lab Results  Component Value Date   AST 18 04/05/2016   Lab Results  Component Value Date   ALT 16 04/05/2016     Urinalysis Unremarkable.  See EPIC.    Pertinent Imaging: Results for ORLANDA, LEMMERMAN (MRN [id number]) as of 01/04/2017 10:39  Ref. Range 01/04/2017 10:26  Scan Result Unknown 29    Assessment & Plan:    1. BPH with LUTS  - IPSS score is 17/5, it is improving  - Continue conservative management, avoiding bladder irritants and timed voiding's  - most bothersome symptoms is/are burning at the tip of penis and frequency  - Continue tamsulosin 0.4 mg daily and finasteride 5 mg daily: refills given  - RTC in 12 months for IPSS and exam   2. Penile pain  - may be due to an ureteral stone  - obtain KUB today  - RTC based on findings  3. Frequency  - abated for now  - see above  - suggested PT if KUB is negative for stones    Return for RTC pending KUB results.  These notes generated with voice recognition software. I apologize for typographical errors.  Zara Council, [facility] [address], [place], [place] [address] [phone]

## 2017-01-04 ENCOUNTER — Ambulatory Visit: Payer: PPO | Admitting: Urology

## 2017-01-04 ENCOUNTER — Telehealth: Payer: Self-pay

## 2017-01-04 ENCOUNTER — Ambulatory Visit
Admission: RE | Admit: 2017-01-04 | Discharge: 2017-01-04 | Disposition: A | Discharge status: Home / Self Care | Payer: PPO | Source: Ambulatory Visit | Attending: Urology | Admitting: Urology

## 2017-01-04 ENCOUNTER — Encounter: Payer: Self-pay | Admitting: Urology

## 2017-01-04 VITALS — BP 126/65 | HR 65 | Ht 71.5 in | Wt 169.8 lb

## 2017-01-04 DIAGNOSIS — N419 Inflammatory disease of prostate, unspecified: Secondary | ICD-10-CM

## 2017-01-04 DIAGNOSIS — N4889 Other specified disorders of penis: Secondary | ICD-10-CM | POA: Diagnosis not present

## 2017-01-04 DIAGNOSIS — R350 Frequency of micturition: Secondary | ICD-10-CM | POA: Insufficient documentation

## 2017-01-04 DIAGNOSIS — N401 Enlarged prostate with lower urinary tract symptoms: Secondary | ICD-10-CM | POA: Diagnosis not present

## 2017-01-04 DIAGNOSIS — N138 Other obstructive and reflux uropathy: Secondary | ICD-10-CM | POA: Diagnosis not present

## 2017-01-04 LAB — URINALYSIS, COMPLETE
Bilirubin, UA: NEGATIVE
Glucose, UA: NEGATIVE
KETONES UA: NEGATIVE
NITRITE UA: NEGATIVE
PH UA: 6 (ref 5.0–7.5)
Protein, UA: NEGATIVE
Specific Gravity, UA: <1.005 — ABNORMAL LOW (ref 1.005–1.030)
UUROB: 0.2 mg/dL (ref 0.2–1.0)

## 2017-01-04 LAB — BLADDER SCAN AMB NON-IMAGING: Scan Result: 29

## 2017-01-04 NOTE — Telephone Encounter (Signed)
-----   Message from Nori Riis, PA-C sent at 01/04/2017 12:50 PM EDT ----- Please let Timothy Scott know that his x-ray was negative for stones.  If he has his symptoms again, we can refer him to PT.

## 2017-01-04 NOTE — Telephone Encounter (Signed)
Spoke with pt in reference to kub results. Pt stated at this time he is not having any s/s. Reinforced with pt if s/s develop again to call back for PT referral. Pt voiced understanding.

## 2017-01-26 ENCOUNTER — Telehealth: Payer: Self-pay | Admitting: Internal Medicine

## 2017-01-26 NOTE — Telephone Encounter (Signed)
Timothy Scott would like to schedule his physical and AWV the same time- Please advise.

## 2017-01-26 NOTE — Telephone Encounter (Signed)
Unfortunately, He will need to do them separate. MAW with [facility], and Physical with [facility]. Please explain the difference with pt. Inform him MAW visit is like a wellness check to make sure he is up to date on all records, shots, and wellness. Physical is hands on with Physician and can turn into problem visit if needed, and gives physician more time to discuss things with him.

## 2017-02-15 ENCOUNTER — Ambulatory Visit (INDEPENDENT_AMBULATORY_CARE_PROVIDER_SITE_OTHER): Payer: PPO | Admitting: Internal Medicine

## 2017-02-15 ENCOUNTER — Encounter: Payer: Self-pay | Admitting: Internal Medicine

## 2017-02-15 VITALS — BP 110/72 | HR 50 | Temp 97.7°F | Resp 17 | Ht 71.5 in | Wt 167.0 lb

## 2017-02-15 DIAGNOSIS — R001 Bradycardia, unspecified: Secondary | ICD-10-CM

## 2017-02-15 DIAGNOSIS — M5116 Intervertebral disc disorders with radiculopathy, lumbar region: Secondary | ICD-10-CM

## 2017-02-15 DIAGNOSIS — R1319 Other dysphagia: Secondary | ICD-10-CM

## 2017-02-15 DIAGNOSIS — J449 Chronic obstructive pulmonary disease, unspecified: Secondary | ICD-10-CM

## 2017-02-15 MED ORDER — GABAPENTIN 100 MG PO CAPS: 100.0000 mg | ORAL_CAPSULE | Freq: Three times a day (TID) | ORAL | 0 refills | Status: DC

## 2017-02-15 NOTE — Patient Instructions (Signed)
Stop Tamsulosin

## 2017-02-15 NOTE — Progress Notes (Signed)
Date:  02/15/2017   Name:  Timothy Scott   DOB:  1946-05-17   MRN:  [id number]   Chief Complaint: Dizziness (onset 2 weeks/ has been hypotensive for last 2-3 weeks) Dizziness  This is a recurrent problem. The problem occurs daily. The problem has been unchanged. Associated symptoms include fatigue and weakness. Pertinent negatives include no chest pain, chills, congestion, coughing, fever or sore throat.   Over the past three weeks having severe sx of lightheadedness and sensation of sinking.  His BP has been on the low side and he also has low heart rate - often as low as 45.  He takes tamsulosin but no other medication that would lower heart rate or BP.  Dysphagia - persistent sx despite a trial of 2 ESI in the anterior neck by Neurology in [place].  He did not notice any benefit and did not follow up.  COPD - was given this dx by CXR a few years ago.  He does not have shortness of breath or wheezing.  He is a former smoker who quit in 1987.  He questions if this could cause his dysphagia.  Review of Systems  Constitutional: Positive for fatigue. Negative for chills and fever.  HENT: Positive for trouble swallowing. Negative for congestion and sore throat.   Respiratory: Negative for cough, chest tightness, shortness of breath and wheezing.   Cardiovascular: Negative for chest pain and palpitations.  Neurological: Positive for dizziness, syncope (pre-syncope), weakness and light-headedness.    Patient Active Problem List   Diagnosis Date Noted  . Vertigo 12/12/2016  . Anosmia 05/05/2016  . Dysphagia 02/03/2016  . Tobacco use disorder, moderate, in sustained remission 03/11/2015  . Lumbar disc herniation with radiculopathy 03/11/2015  . Gonalgia 03/10/2015  . DDD (degenerative disc disease), cervical 03/10/2015  . Difficult or painful urination 03/10/2015  . COPD, mild ([place]) 03/10/2015  . Degenerative arthritis of finger 03/10/2015  . Post-traumatic stress disorder  03/10/2015  . Lattice degeneration 03/10/2015  . Leg varices 03/10/2015  . Sinus bradycardia 04/17/2014    Prior to Admission medications   Medication Sig Start Date End Date Taking? Authorizing Provider  azelastine (ASTELIN) 0.1 % nasal spray  11/18/15  Yes [provider]  Calcium Carb-Cholecalciferol 600-800 MG-UNIT CHEW Chew by mouth.   Yes [provider]  Calcium Carbonate-Vit D-Min (CALCIUM 1200 PO) Take 1 mg by mouth.   Yes [provider]  finasteride (PROSCAR) 5 MG tablet Take 5 mg by mouth daily.   Yes [provider]  fluticasone (FLONASE) 50 MCG/ACT nasal spray Place 2 sprays into both nostrils daily. Reported on 03/01/2016   Yes [provider]  magnesium oxide (MAG-OX) 400 MG tablet Take 400 mg by mouth daily.   Yes [provider]  naproxen (NAPROSYN) 500 MG tablet Take 500 mg by mouth 2 (two) times daily with a meal.   Yes [provider]  Probiotic Product (PROBIOTIC DAILY PO) Take by mouth.   Yes [provider]  Saw Palmetto 450 MG CAPS Take by mouth daily.   Yes [provider]  tamsulosin (FLOMAX) 0.4 MG CAPS capsule Take 0.4 mg by mouth.   Yes [provider]  traZODone (DESYREL) 50 MG tablet Take 50 mg by mouth at bedtime.   Yes [provider]  Turmeric 500 MG TABS Take by mouth daily.   Yes [provider]  meclizine (ANTIVERT) 25 MG tablet Take 1 tablet (25 mg total) by mouth daily after  supper. Patient not taking: Reported on 01/04/2017 12/12/16   Glean Hess, MD  UNABLE TO FIND [facility]    [provider]    No Known Allergies  Past Surgical History:  Procedure Laterality Date  . CATARACT EXTRACTION Right 2015  . CATARACT EXTRACTION W/PHACO Left 10/21/2015   Procedure: CATARACT EXTRACTION PHACO AND INTRAOCULAR LENS PLACEMENT (IOC);  Surgeon: Leandrew Koyanagi, MD;  Location: [facility];  Service:  Ophthalmology;  Laterality: Left;  . COLONOSCOPY    . COLONOSCOPY  2014   benign polyps  . CYSTOSCOPY    . ESOPHAGEAL MANOMETRY N/A 08/03/2016   Procedure: ESOPHAGEAL MANOMETRY (EM);  Surgeon: Mauri Pole, MD;  Location: [facility];  Service: Endoscopy;  Laterality: N/A;  . ESOPHAGOGASTRODUODENOSCOPY (EGD) WITH PROPOFOL N/A 02/17/2016   with esoph dilatation  . TONSILLECTOMY      Social History  Substance Use Topics  . Smoking status: Former Smoker    Quit date: 04/05/1986  . Smokeless tobacco: Never Used     Comment: quit 25-30 yrs ago  . Alcohol use 8.4 oz/week    14 Standard drinks or equivalent per week     Comment: socially     Medication list has been reviewed and updated.   Physical Exam  Constitutional: He is oriented to person, place, and time. He appears well-developed. No distress.  HENT:  Head: Normocephalic and atraumatic.  Cardiovascular: Normal heart sounds and intact distal pulses.  Bradycardia present.  Exam reveals no gallop.   No murmur heard. Pulmonary/Chest: Effort normal. No respiratory distress. He has no decreased breath sounds. He has no wheezes.  Musculoskeletal: Normal range of motion.  Neurological: He is alert and oriented to person, place, and time.  Skin: Skin is warm and dry. No rash noted.  Psychiatric: He has a normal mood and affect. His behavior is normal. Thought content normal.  Nursing note and vitals reviewed.   BP 110/72 (BP Location: Right Arm, Patient Position: Sitting, Cuff Size: Normal)   Pulse (!) 50   Temp 97.7 F (36.5 C) (Oral)   Resp 17   Ht 5' 11.5" (1.816 m)   Wt 167 lb (75.8 kg)   SpO2 97%   BMI 22.97 kg/m   Assessment and Plan: 1. Sinus bradycardia With sx of fatigue - Ambulatory referral to Cardiology  2. Other dysphagia Stable; consider consulting Neurology again for additional suggestions  3. Lumbar disc herniation with radiculopathy Will try low dose gabapentin at bedtime - gabapentin  (NEURONTIN) 100 MG capsule; Take 1 capsule (100 mg total) by mouth 3 (three) times daily.  Dispense: 90 capsule; Refill: 0  4. COPD, mild ([place]) Asymptomatic on no medications Do not think this is contributing to dysphagia  Meds ordered this encounter  Medications  . gabapentin (NEURONTIN) 100 MG capsule    Sig: Take 1 capsule (100 mg total) by mouth 3 (three) times daily.    Dispense:  90 capsule    Refill:  0    Halina Maidens, MD [facility]  02/15/2017

## 2017-02-17 DIAGNOSIS — R1314 Dysphagia, pharyngoesophageal phase: Secondary | ICD-10-CM | POA: Diagnosis not present

## 2017-02-17 DIAGNOSIS — K219 Gastro-esophageal reflux disease without esophagitis: Secondary | ICD-10-CM | POA: Diagnosis not present

## 2017-02-23 ENCOUNTER — Encounter: Payer: Self-pay | Admitting: Internal Medicine

## 2017-02-23 ENCOUNTER — Ambulatory Visit (INDEPENDENT_AMBULATORY_CARE_PROVIDER_SITE_OTHER): Payer: PPO | Admitting: Internal Medicine

## 2017-02-23 VITALS — BP 132/70 | HR 52 | Ht 71.5 in | Wt 171.0 lb

## 2017-02-23 DIAGNOSIS — R001 Bradycardia, unspecified: Secondary | ICD-10-CM

## 2017-02-23 DIAGNOSIS — M5116 Intervertebral disc disorders with radiculopathy, lumbar region: Secondary | ICD-10-CM

## 2017-02-23 DIAGNOSIS — E782 Mixed hyperlipidemia: Secondary | ICD-10-CM | POA: Diagnosis not present

## 2017-02-23 DIAGNOSIS — Z0000 Encounter for general adult medical examination without abnormal findings: Secondary | ICD-10-CM

## 2017-02-23 DIAGNOSIS — R351 Nocturia: Secondary | ICD-10-CM | POA: Diagnosis not present

## 2017-02-23 LAB — POCT URINALYSIS DIPSTICK
Bilirubin, UA: NEGATIVE
GLUCOSE UA: NEGATIVE
Ketones, UA: NEGATIVE
NITRITE UA: NEGATIVE
PROTEIN UA: NEGATIVE
RBC UA: NEGATIVE
Spec Grav, UA: <=1.005 — AB (ref 1.010–1.025)
UROBILINOGEN UA: 0.2 U/dL
pH, UA: 5 (ref 5.0–8.0)

## 2017-02-23 NOTE — Progress Notes (Signed)
Patient: Timothy Scott, Male    DOB: Aug 08, 1946, 71 y.o.   MRN: [id number] Visit Date: 02/23/2017  Today's Provider: Halina Maidens, MD   Chief Complaint  Patient presents with  . Annual Exam   Subjective:    Annual wellness visit Timothy Scott is a 71 y.o. male who presents today for his Subsequent Annual Wellness Visit. He feels fairly well. He reports exercising walking. He reports he is sleeping fairly well.  Bradycardia and lightheadedness continue. No change with stopping tamsulosin.  He had cardiology appt on 03/06/17 (moved up from 7/28) ----------------------------------------------------------- HPI Dysphagia - unchanged.  Work up extensive,  He is considering trying accu puncture.  He started low dose gabapentin last visit with no change.  Lumbar stenosis - on gabapentin 100 mg with no benefit and no side effects.  Discussed increasing to 200 mg at HS.  Review of Systems  Constitutional: Positive for fatigue. Negative for chills and fever.  HENT: Positive for trouble swallowing. Negative for congestion and sore throat.   Respiratory: Negative for cough, chest tightness, shortness of breath and wheezing.   Cardiovascular: Negative for chest pain and palpitations.  Musculoskeletal: Positive for back pain (radiating to LE).  Neurological: Positive for dizziness, weakness and light-headedness. Negative for syncope (pre-syncope).  Psychiatric/Behavioral: Positive for sleep disturbance. Negative for dysphoric mood. The patient is not nervous/anxious.     Social History   Social History  . Marital status: Divorced    Spouse name: N/A  . Number of children: N/A  . Years of education: N/A   Occupational History  . Not on file.   Social History Main Topics  . Smoking status: Former Smoker    Quit date: 04/05/1986  . Smokeless tobacco: Never Used     Comment: quit 25-30 yrs ago  . Alcohol use 8.4 oz/week    14 Standard drinks or equivalent per week     Comment:  socially  . Drug use: No  . Sexual activity: Not on file   Other Topics Concern  . Not on file   Social History Narrative  . No narrative on file    Patient Active Problem List   Diagnosis Date Noted  . Vertigo 12/12/2016  . Anosmia 05/05/2016  . Dysphagia 02/03/2016  . Tobacco use disorder, moderate, in sustained remission 03/11/2015  . Lumbar disc herniation with radiculopathy 03/11/2015  . Gonalgia 03/10/2015  . DDD (degenerative disc disease), cervical 03/10/2015  . Difficult or painful urination 03/10/2015  . COPD, mild ([place]) 03/10/2015  . Degenerative arthritis of finger 03/10/2015  . Post-traumatic stress disorder 03/10/2015  . Lattice degeneration 03/10/2015  . Leg varices 03/10/2015  . Sinus bradycardia 04/17/2014    Past Surgical History:  Procedure Laterality Date  . CATARACT EXTRACTION Right 2015  . CATARACT EXTRACTION W/PHACO Left 10/21/2015   Procedure: CATARACT EXTRACTION PHACO AND INTRAOCULAR LENS PLACEMENT (IOC);  Surgeon: Leandrew Koyanagi, MD;  Location: [facility];  Service: Ophthalmology;  Laterality: Left;  . COLONOSCOPY    . COLONOSCOPY  2014   benign polyps  . CYSTOSCOPY    . ESOPHAGEAL MANOMETRY N/A 08/03/2016   Procedure: ESOPHAGEAL MANOMETRY (EM);  Surgeon: Mauri Pole, MD;  Location: [facility];  Service: Endoscopy;  Laterality: N/A;  . ESOPHAGOGASTRODUODENOSCOPY (EGD) WITH PROPOFOL N/A 02/17/2016   with esoph dilatation  . TONSILLECTOMY      His family history includes Hypertension in his mother.     Previous Medications   AZELASTINE (ASTELIN) 0.1 % NASAL SPRAY  CALCIUM CARB-CHOLECALCIFEROL 600-800 MG-UNIT CHEW    Chew by mouth.   CALCIUM CARBONATE-VIT D-MIN (CALCIUM 1200 PO)    Take 1 mg by mouth.   FINASTERIDE (PROSCAR) 5 MG TABLET    Take 5 mg by mouth daily.   FLUTICASONE (FLONASE) 50 MCG/ACT NASAL SPRAY    Place 2 sprays into both nostrils daily. Reported on 03/01/2016   GABAPENTIN (NEURONTIN) 100 MG CAPSULE     Take 1 capsule (100 mg total) by mouth 3 (three) times daily.   MAGNESIUM OXIDE (MAG-OX) 400 MG TABLET    Take 400 mg by mouth daily.   NAPROXEN (NAPROSYN) 500 MG TABLET    Take 500 mg by mouth 2 (two) times daily with a meal.   PROBIOTIC PRODUCT (PROBIOTIC DAILY PO)    Take by mouth.   SAW PALMETTO 450 MG CAPS    Take by mouth daily.   TAMSULOSIN (FLOMAX) 0.4 MG CAPS CAPSULE    Take 0.4 mg by mouth.   TRAZODONE (DESYREL) 50 MG TABLET    Take 50 mg by mouth at bedtime.   TURMERIC 500 MG TABS    Take by mouth daily.    Patient Care Team: Glean Hess, MD as PCP - General (Family Medicine) Wellington Hampshire, MD as Consulting Physician (Cardiology) Manya Silvas, MD (Gastroenterology) Hollice Espy, MD as Consulting Physician (Urology)      Objective:   Vitals: BP 132/70   Pulse (!) 52   Ht 5' 11.5" (1.816 m)   Wt 171 lb (77.6 kg)   SpO2 98%   BMI 23.52 kg/m   Physical Exam  Constitutional: He is oriented to person, place, and time. He appears well-developed and well-nourished.  HENT:  Head: Normocephalic.  Right Ear: Tympanic membrane, external ear and ear canal normal.  Left Ear: Tympanic membrane, external ear and ear canal normal.  Nose: Nose normal.  Mouth/Throat: Uvula is midline and oropharynx is clear and moist.  Eyes: Conjunctivae and EOM are normal. Pupils are equal, round, and reactive to light.  Neck: Normal range of motion. Neck supple. Carotid bruit is not present. No thyromegaly present.  Cardiovascular: Regular rhythm, normal heart sounds and intact distal pulses.  Bradycardia present.   Pulmonary/Chest: Effort normal and breath sounds normal. He has no wheezes. Right breast exhibits no mass. Left breast exhibits no mass.  Abdominal: Soft. Normal appearance and bowel sounds are normal. There is no hepatosplenomegaly. There is no tenderness.  Musculoskeletal:       Lumbar back: He exhibits bony tenderness. He exhibits no spasm.  Lymphadenopathy:     He has no cervical adenopathy.  Neurological: He is alert and oriented to person, place, and time. He has normal strength. No cranial nerve deficit. Gait normal.  Reflex Scores:      Patellar reflexes are 1+ on the right side and 1+ on the left side. Skin: Skin is warm, dry and intact.  Psychiatric: He has a normal mood and affect. His speech is normal and behavior is normal. Judgment and thought content normal. Cognition and memory are normal.  Nursing note and vitals reviewed.   Activities of Daily Living In your present state of health, do you have any difficulty performing the following activities: 02/23/2017  Hearing? N  Vision? N  Difficulty concentrating or making decisions? N  Walking or climbing stairs? N  Dressing or bathing? N  Doing errands, shopping? N  Some recent data might be hidden    Fall Risk Assessment Fall Risk  02/23/2017 02/15/2017 04/05/2016 01/08/2016 03/11/2015  Falls in the past year? No No No No No    Depression Screen PHQ 2/9 Scores 02/23/2017 02/15/2017 01/08/2016 03/11/2015  PHQ - 2 Score 0 1 0 0    Medicare Annual Wellness Visit Summary:  Reviewed patient's Family Medical History Reviewed and updated list of patient's medical providers Assessment of cognitive impairment was done Assessed patient's functional ability Established a written schedule for health screening [place] Completed and Reviewed  Exercise Activities and Dietary recommendations Goals    . Increase water intake          To avoid bradycardia and lightheadedness.       Immunization History  Administered Date(s) Administered  . Influenza-Unspecified 09/06/2015  . Pneumococcal Conjugate-13 05/28/2015  . Pneumococcal Polysaccharide-23 09/06/2010  . Tdap 09/06/2010  . Zoster 09/06/2010    Health Maintenance  Topic Date Due  . Hepatitis C Screening  1946/05/14  . INFLUENZA VACCINE  04/05/2017  . TETANUS/TDAP  09/06/2020  . COLONOSCOPY  09/05/2022  . PNA  vac Low Risk Adult  Completed    Discussed health benefits of physical activity, and encouraged him to engage in regular exercise appropriate for his age and condition.    ------------------------------------------------------------------------------------------------------------  Assessment & Plan:  1. Medicare annual wellness visit, subsequent Measures satisfied - POCT urinalysis dipstick  2. Sinus bradycardia Cardiology consult pending - CBC with Differential/Platelet - Comprehensive metabolic panel - TSH  3. Lumbar disc herniation with radiculopathy Increase gabapentin up to 300 mg  4. Nocturia more than twice per night Check PSA Resume Tamsulsin since no change in lightheadedness - PSA  5. Mixed hyperlipidemia adivse - Lipid panel   No orders of the defined types were placed in this encounter.   Halina Maidens, MD [facility]  02/23/2017

## 2017-02-23 NOTE — Patient Instructions (Addendum)
Health Maintenance  Topic Date Due  . Hepatitis C Screening  1946/04/14  . INFLUENZA VACCINE  04/05/2017  . TETANUS/TDAP  09/06/2020  . COLONOSCOPY  09/05/2022  . PNA vac Low Risk Adult  Completed    Increase gabapentin to 200 mg in evening.  After a week, if no side effects, increase to 300 mg

## 2017-02-24 LAB — LIPID PANEL
CHOL/HDL RATIO: 2.7 ratio (ref 0.0–5.0)
Cholesterol, Total: 170 mg/dL (ref 100–199)
HDL: 63 mg/dL (ref >39)
LDL CALC: 96 mg/dL (ref 0–99)
TRIGLYCERIDES: 56 mg/dL (ref 0–149)
VLDL Cholesterol Cal: 11 mg/dL (ref 5–40)

## 2017-02-24 LAB — CBC WITH DIFFERENTIAL/PLATELET
BASOS: 1 %
Basophils Absolute: 0 10*3/uL (ref 0.0–0.2)
EOS (ABSOLUTE): 0.1 10*3/uL (ref 0.0–0.4)
EOS: 2 %
HEMATOCRIT: 40.8 % (ref 37.5–51.0)
HEMOGLOBIN: 13.7 g/dL (ref 13.0–17.7)
Immature Grans (Abs): 0 10*3/uL (ref 0.0–0.1)
Immature Granulocytes: 0 %
LYMPHS ABS: 1.2 10*3/uL (ref 0.7–3.1)
Lymphs: 18 %
MCH: 32.7 pg (ref 26.6–33.0)
MCHC: 33.6 g/dL (ref 31.5–35.7)
MCV: 97 fL (ref 79–97)
MONOCYTES: 9 %
MONOS ABS: 0.6 10*3/uL (ref 0.1–0.9)
Neutrophils Absolute: 4.5 10*3/uL (ref 1.4–7.0)
Neutrophils: 70 %
Platelets: 282 10*3/uL (ref 150–379)
RBC: 4.19 x10E6/uL (ref 4.14–5.80)
RDW: 13.4 % (ref 12.3–15.4)
WBC: 6.4 10*3/uL (ref 3.4–10.8)

## 2017-02-24 LAB — COMPREHENSIVE METABOLIC PANEL
A/G RATIO: 2 (ref 1.2–2.2)
ALBUMIN: 4.3 g/dL (ref 3.5–4.8)
ALK PHOS: 45 IU/L (ref 39–117)
ALT: 17 IU/L (ref 0–44)
AST: 23 IU/L (ref 0–40)
BUN / CREAT RATIO: 11 (ref 10–24)
BUN: 9 mg/dL (ref 8–27)
Bilirubin Total: 0.3 mg/dL (ref 0.0–1.2)
CALCIUM: 8.9 mg/dL (ref 8.6–10.2)
CO2: 23 mmol/L (ref 20–29)
CREATININE: 0.84 mg/dL (ref 0.76–1.27)
Chloride: 105 mmol/L (ref 96–106)
GFR calc Af Amer: 102 mL/min/{1.73_m2} (ref >59)
GFR, EST NON AFRICAN AMERICAN: 88 mL/min/{1.73_m2} (ref >59)
GLOBULIN, TOTAL: 2.1 g/dL (ref 1.5–4.5)
Glucose: 88 mg/dL (ref 65–99)
POTASSIUM: 4.4 mmol/L (ref 3.5–5.2)
SODIUM: 142 mmol/L (ref 134–144)
Total Protein: 6.4 g/dL (ref 6.0–8.5)

## 2017-02-24 LAB — PSA: PROSTATE SPECIFIC AG, SERUM: 0.2 ng/mL (ref 0.0–4.0)

## 2017-02-24 LAB — TSH: TSH: 2.53 u[IU]/mL (ref 0.450–4.500)

## 2017-03-01 DIAGNOSIS — J449 Chronic obstructive pulmonary disease, unspecified: Secondary | ICD-10-CM | POA: Diagnosis not present

## 2017-03-01 DIAGNOSIS — I8393 Asymptomatic varicose veins of bilateral lower extremities: Secondary | ICD-10-CM | POA: Diagnosis not present

## 2017-03-01 DIAGNOSIS — R001 Bradycardia, unspecified: Secondary | ICD-10-CM | POA: Diagnosis not present

## 2017-03-06 ENCOUNTER — Ambulatory Visit: Payer: PPO | Admitting: Cardiovascular Disease

## 2017-03-09 DIAGNOSIS — I495 Sick sinus syndrome: Secondary | ICD-10-CM | POA: Insufficient documentation

## 2017-03-09 DIAGNOSIS — J449 Chronic obstructive pulmonary disease, unspecified: Secondary | ICD-10-CM | POA: Diagnosis not present

## 2017-03-09 DIAGNOSIS — R001 Bradycardia, unspecified: Secondary | ICD-10-CM | POA: Diagnosis not present

## 2017-03-09 DIAGNOSIS — Z720 Tobacco use: Secondary | ICD-10-CM | POA: Diagnosis not present

## 2017-04-28 ENCOUNTER — Ambulatory Visit (INDEPENDENT_AMBULATORY_CARE_PROVIDER_SITE_OTHER): Payer: PPO | Admitting: Internal Medicine

## 2017-04-28 ENCOUNTER — Ambulatory Visit: Payer: PPO | Admitting: Cardiovascular Disease

## 2017-04-28 ENCOUNTER — Encounter: Payer: Self-pay | Admitting: Internal Medicine

## 2017-04-28 VITALS — BP 120/78 | HR 50 | Resp 16 | Ht 71.5 in | Wt 170.0 lb

## 2017-04-28 DIAGNOSIS — B029 Zoster without complications: Secondary | ICD-10-CM

## 2017-04-28 DIAGNOSIS — I495 Sick sinus syndrome: Secondary | ICD-10-CM

## 2017-04-28 MED ORDER — VALACYCLOVIR HCL 1 G PO TABS: 1000.0000 mg | ORAL_TABLET | Freq: Three times a day (TID) | ORAL | 0 refills | Status: DC

## 2017-04-28 NOTE — Progress Notes (Signed)
Date:  04/28/2017   Name:  Timothy Scott   DOB:  18-Oct-1945   MRN:  [id number]   Chief Complaint: Rash Rash  This is a new problem. The current episode started yesterday. The problem is unchanged. The affected locations include the torso. The rash is characterized by redness and blistering. He was exposed to nothing. Associated symptoms include fatigue. Pertinent negatives include no cough, fever or shortness of breath. Past treatments include nothing.   Fatigue - patient complains of ongoing fatigue somewhat worse over the past month or so. He recently saw his cardiologist for sick sinus syndrome who suggested a pacemaker. He had wanted to delay that. He still concerned that his swallowing difficulty might be related to some occult malignancy. During his workup I do not think this is a strong possibility.   Review of Systems  Constitutional: Positive for fatigue. Negative for fever.  Respiratory: Negative for cough and shortness of breath.   Skin: Positive for color change and rash.    Patient Active Problem List   Diagnosis Date Noted  . Sick sinus syndrome ([place]) 03/09/2017  . Vertigo 12/12/2016  . Anosmia 05/05/2016  . Dysphagia 02/03/2016  . Tobacco use disorder, moderate, in sustained remission 03/11/2015  . Lumbar disc herniation with radiculopathy 03/11/2015  . Gonalgia 03/10/2015  . DDD (degenerative disc disease), cervical 03/10/2015  . Difficult or painful urination 03/10/2015  . COPD, mild ([place]) 03/10/2015  . Degenerative arthritis of finger 03/10/2015  . Post-traumatic stress disorder 03/10/2015  . Lattice degeneration 03/10/2015  . Leg varices 03/10/2015  . Sinus bradycardia 04/17/2014    Prior to Admission medications   Medication Sig Start Date End Date Taking? Authorizing Provider  azelastine (ASTELIN) 0.1 % nasal spray  11/18/15  Yes [provider]  Calcium Carb-Cholecalciferol 600-800 MG-UNIT CHEW Chew by mouth.   Yes [provider]    Calcium Carbonate-Vit D-Min (CALCIUM 1200 PO) Take 1 mg by mouth.   Yes [provider]  finasteride (PROSCAR) 5 MG tablet Take 5 mg by mouth daily.   Yes [provider]  fluticasone (FLONASE) 50 MCG/ACT nasal spray Place 2 sprays into both nostrils daily. Reported on 03/01/2016   Yes [provider]  gabapentin (NEURONTIN) 100 MG capsule  02/15/17  Yes [provider]  magnesium oxide (MAG-OX) 400 MG tablet Take 400 mg by mouth daily.   Yes [provider]  meclizine (ANTIVERT) 25 MG tablet  12/12/16  Yes [provider]  naproxen (NAPROSYN) 500 MG tablet Take 500 mg by mouth 2 (two) times daily with a meal.   Yes [provider]  Probiotic Product (PROBIOTIC DAILY PO) Take by mouth.   Yes [provider]  Saw Palmetto 450 MG CAPS Take by mouth daily.   Yes [provider]  tamsulosin (FLOMAX) 0.4 MG CAPS capsule Take 0.4 mg by mouth.   Yes [provider]  traZODone (DESYREL) 50 MG tablet Take 50 mg by mouth at bedtime.   Yes [provider]  Turmeric 500 MG TABS Take by mouth daily.   Yes [provider]  valACYclovir (VALTREX) 1000 MG tablet Take 1 tablet (1,000 mg total) by mouth 3 (three) times daily. 04/28/17   Glean Hess, MD    No Known Allergies  Past Surgical History:  Procedure Laterality Date  . CATARACT EXTRACTION Right 2015  . CATARACT EXTRACTION W/PHACO Left 10/21/2015   Procedure: CATARACT EXTRACTION PHACO AND INTRAOCULAR LENS PLACEMENT (IOC);  Surgeon:  Leandrew Koyanagi, MD;  Location: [facility];  Service: Ophthalmology;  Laterality: Left;  . COLONOSCOPY    . COLONOSCOPY  2014   benign polyps  . CYSTOSCOPY    . ESOPHAGEAL MANOMETRY N/A 08/03/2016   Procedure: ESOPHAGEAL MANOMETRY (EM);  Surgeon: Mauri Pole, MD;  Location: [facility];  Service: Endoscopy;  Laterality: N/A;  . ESOPHAGOGASTRODUODENOSCOPY (EGD) WITH PROPOFOL N/A 02/17/2016    with esoph dilatation  . TONSILLECTOMY      Social History  Substance Use Topics  . Smoking status: Former Smoker    Quit date: 04/05/1986  . Smokeless tobacco: Never Used     Comment: quit 25-30 yrs ago  . Alcohol use 8.4 oz/week    14 Standard drinks or equivalent per week     Comment: socially     Medication list has been reviewed and updated.  PHQ 2/9 Scores 02/23/2017 02/15/2017 01/08/2016 03/11/2015  PHQ - 2 Score 0 1 0 0    Physical Exam  Constitutional: He is oriented to person, place, and time. He appears well-developed. No distress.  HENT:  Head: Normocephalic and atraumatic.  Cardiovascular: Regular rhythm and normal heart sounds.  Bradycardia present.   Pulmonary/Chest: Effort normal and breath sounds normal. No respiratory distress.  Musculoskeletal: Normal range of motion.  Neurological: He is alert and oriented to person, place, and time.  Skin: Skin is warm and dry. No rash noted.     Psychiatric: He has a normal mood and affect. His behavior is normal. Thought content normal.  Nursing note and vitals reviewed.   BP 120/78   Pulse (!) 50   Resp 16   Ht 5' 11.5" (1.816 m)   Wt 170 lb (77.1 kg)   SpO2 100%   BMI 23.38 kg/m   Assessment and Plan: 1. Herpes zoster without complication advil or tylenol as needed - valACYclovir (VALTREX) 1000 MG tablet; Take 1 tablet (1,000 mg total) by mouth 3 (three) times daily.  Dispense: 21 tablet; Refill: 0  2. Sick sinus syndrome ([place]) Recommend follow up with cardiology if not improving in one month (zoster could be contributing)   Meds ordered this encounter  Medications  . valACYclovir (VALTREX) 1000 MG tablet    Sig: Take 1 tablet (1,000 mg total) by mouth 3 (three) times daily.    Dispense:  21 tablet    Refill:  0    Partially dictated using Editor, commissioning. Any errors are unintentional.  Halina Maidens, MD [facility]  04/28/2017

## 2017-04-28 NOTE — Patient Instructions (Signed)
Shingles Shingles, which is also known as herpes zoster, is an infection that causes a painful skin rash and fluid-filled blisters. Shingles is not related to genital herpes, which is a sexually transmitted infection. Shingles only develops in people who:  Have had chickenpox.  Have received the chickenpox vaccine. (This is rare.)  What are the causes? Shingles is caused by varicella-zoster virus (VZV). This is the same virus that causes chickenpox. After exposure to VZV, the virus stays in the body in an inactive (dormant) state. Shingles develops if the virus reactivates. This can happen many years after the initial exposure to VZV. It is not known what causes this virus to reactivate. What increases the risk? People who have had chickenpox or received the chickenpox vaccine are at risk for shingles. Infection is more common in people who:  Are older than age 50.  Have a weakened defense (immune) system, such as those with HIV, AIDS, or cancer.  Are taking medicines that weaken the immune system, such as transplant medicines.  Are under great stress.  What are the signs or symptoms? Early symptoms of this condition include itching, tingling, and pain in an area on your skin. Pain may be described as burning, stabbing, or throbbing. A few days or weeks after symptoms start, a painful red rash appears, usually on one side of the body in a bandlike or beltlike pattern. The rash eventually turns into fluid-filled blisters that break open, scab over, and dry up in about 2-3 weeks. At any time during the infection, you may also develop:  A fever.  Chills.  A headache.  An upset stomach.  How is this diagnosed? This condition is diagnosed with a skin exam. Sometimes, skin or fluid samples are taken from the blisters before a diagnosis is made. These samples are examined under a microscope or sent to a lab for testing. How is this treated? There is no specific cure for this condition.  Your health care provider will probably prescribe medicines to help you manage pain, recover more quickly, and avoid long-term problems. Medicines may include:  Antiviral drugs.  Anti-inflammatory drugs.  Pain medicines.  If the area involved is on your face, you may be referred to a specialist, such as an eye doctor (ophthalmologist) or an ear, nose, and throat (ENT) doctor to help you avoid eye problems, chronic pain, or disability. Follow these instructions at home: Medicines  Take medicines only as directed by your health care provider.  Apply an anti-itch or numbing cream to the affected area as directed by your health care provider. Blister and Rash Care  Take a cool bath or apply cool compresses to the area of the rash or blisters as directed by your health care provider. This may help with pain and itching.  Keep your rash covered with a loose bandage (dressing). Wear loose-fitting clothing to help ease the pain of material rubbing against the rash.  Keep your rash and blisters clean with mild soap and cool water or as directed by your health care provider.  Check your rash every day for signs of infection. These include redness, swelling, and pain that lasts or increases.  Do not pick your blisters.  Do not scratch your rash. General instructions  Rest as directed by your health care provider.  Keep all follow-up visits as directed by your health care provider. This is important.  Until your blisters scab over, your infection can cause chickenpox in people who have never had it or been vaccinated   against it. To prevent this from happening, avoid contact with other people, especially: ? Babies. ? Pregnant women. ? Children who have eczema. ? Elderly people who have transplants. ? People who have chronic illnesses, such as leukemia or AIDS. Contact a health care provider if:  Your pain is not relieved with prescribed medicines.  Your pain does not get better after  the rash heals.  Your rash looks infected. Signs of infection include redness, swelling, and pain that lasts or increases. Get help right away if:  The rash is on your face or nose.  You have facial pain, pain around your eye area, or loss of feeling on one side of your face.  You have ear pain or you have ringing in your ear.  You have loss of taste.  Your condition gets worse. This information is not intended to replace advice given to you by your health care provider. Make sure you discuss any questions you have with your health care provider. Document Released: 08/22/2005 Document Revised: 04/17/2016 Document Reviewed: 07/03/2014 Elsevier Interactive Patient Education  2017 [employer]  

## 2017-05-02 ENCOUNTER — Ambulatory Visit (INDEPENDENT_AMBULATORY_CARE_PROVIDER_SITE_OTHER): Payer: PPO | Admitting: Diagnostic Neuroimaging

## 2017-05-02 ENCOUNTER — Encounter: Payer: Self-pay | Admitting: Diagnostic Neuroimaging

## 2017-05-02 VITALS — BP 114/59 | HR 52 | Wt 172.4 lb

## 2017-05-02 DIAGNOSIS — F458 Other somatoform disorders: Secondary | ICD-10-CM

## 2017-05-02 DIAGNOSIS — R0989 Other specified symptoms and signs involving the circulatory and respiratory systems: Secondary | ICD-10-CM

## 2017-05-02 NOTE — Progress Notes (Signed)
[facility]  PATIENT: Timothy Scott DOB: 03-28-1946  REFERRING CLINICIAN: Army Melia, L HISTORY FROM: patient and wife  REASON FOR VISIT: follow up    HISTORICAL  CHIEF COMPLAINT:  Chief Complaint  Patient presents with  . Globus sensation    rm 7, wife- [name], "still  have tightness, swollen throat; have had many tests, maybe bone spur in my neck but not definitive; want someone to tell me what's causing htis"  . Follow-up    one year    HISTORY OF PRESENT ILLNESS:   UPDATE 05/02/17: Since last visit, he has followed up with gastroenterology and extensive testing with no specific etiology found. He also went to [facility] neurology for cervical epidural steroid injections, without relief. Patient continues to have swollen sensation in his throat and anterior neck. He is very concerned about what might be causing his symptoms. He is very concerned about the inability for his doctors to figure out what is causing his symptoms.  PRIOR HPI (04/05/17): 71 year old male here for evaluation of abnormal sensation in throat for past 5-6 months. Patient reports a lump, foreign body sensation in his throat. He feels swollen sensation. When he eats and chews and swallows he has some slight increase in sensation. However symptom is fairly constant throughout even without talking or swallowing. Patient also has noticed some decreased sense of smell, lightheadedness, faintness and dizziness. Patient is seen ENT, GI, had barium swallow study, endoscopy, CT scan of soft tissue of the neck, MRI of the brain, all which have been unremarkable. Patient denies any problems with his arms, legs, muscle strength, vision. Patient does have history of PTSD and is on medication. No increase in anxiety or stress recently.   REVIEW OF SYSTEMS: Full 14 system review of systems performed and negative with exception of: Dizziness weakness swollen lymph nodes joint pain aching muscles muscle cramps neck  pain frequent waking fatigue.   ALLERGIES: No Known Allergies  HOME MEDICATIONS: Outpatient Medications Prior to Visit  Medication Sig Dispense Refill  . azelastine (ASTELIN) 0.1 % nasal spray     . Calcium Carb-Cholecalciferol 600-800 MG-UNIT CHEW Chew by mouth.    . Calcium Carbonate-Vit D-Min (CALCIUM 1200 PO) Take 1 mg by mouth.    . finasteride (PROSCAR) 5 MG tablet Take 5 mg by mouth daily.    . fluticasone (FLONASE) 50 MCG/ACT nasal spray Place 2 sprays into both nostrils daily. Reported on 03/01/2016    . gabapentin (NEURONTIN) 100 MG capsule     . magnesium oxide (MAG-OX) 400 MG tablet Take 400 mg by mouth daily.    . meclizine (ANTIVERT) 25 MG tablet     . naproxen (NAPROSYN) 500 MG tablet Take 500 mg by mouth 2 (two) times daily with a meal.    . Probiotic Product (PROBIOTIC DAILY PO) Take by mouth.    . Saw Palmetto 450 MG CAPS Take by mouth daily.    . tamsulosin (FLOMAX) 0.4 MG CAPS capsule Take 0.4 mg by mouth.    . traZODone (DESYREL) 50 MG tablet Take 50 mg by mouth at bedtime.    . Turmeric 500 MG TABS Take by mouth daily.    . valACYclovir (VALTREX) 1000 MG tablet Take 1 tablet (1,000 mg total) by mouth 3 (three) times daily. 21 tablet 0   No facility-administered medications prior to visit.     PAST MEDICAL HISTORY: Past Medical History:  Diagnosis Date  . Benign neoplasm of colon   . Chronic airway obstruction,  not elsewhere classified   . COPD (chronic obstructive pulmonary disease) ([place])   . DDD (degenerative disc disease), cervical   . Degeneration of cervical intervertebral disc   . Dysphagia   . Dysuria   . 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 Lattice degeneration of peripheral retina   . Leg varices 03/10/2015  . Osteoarthrosis, unspecified whether generalized or localized, hand   . Posttraumatic stress disorder   . Sinus bradycardia 04/17/2014  . Varicose veins of bilateral lower extremities with other complications   .  Wears dentures    partial upper and lower    PAST SURGICAL HISTORY: Past Surgical History:  Procedure Laterality Date  . CATARACT EXTRACTION Right 2015  . CATARACT EXTRACTION W/PHACO Left 10/21/2015   Procedure: CATARACT EXTRACTION PHACO AND INTRAOCULAR LENS PLACEMENT (IOC);  Surgeon: Leandrew Koyanagi, MD;  Location: [place];  Service: Ophthalmology;  Laterality: Left;  . COLONOSCOPY    . COLONOSCOPY  2014   benign polyps  . CYSTOSCOPY    . ESOPHAGEAL MANOMETRY N/A 08/03/2016   Procedure: ESOPHAGEAL MANOMETRY (EM);  Surgeon: Mauri Pole, MD;  [facility];  Service: Endoscopy;  Laterality: N/A;  . ESOPHAGOGASTRODUODENOSCOPY (EGD) WITH PROPOFOL N/A 02/17/2016   with esoph dilatation  . TONSILLECTOMY      FAMILY HISTORY: Family History  Problem Relation Age of Onset  . Hypertension Mother   . Prostate cancer Neg Hx   . Kidney cancer Neg Hx   . Bladder Cancer Neg Hx     SOCIAL HISTORY:  Social History   Social History  . Marital status: Divorced    Spouse name: N/A  . Number of children: N/A  . Years of education: N/A   Occupational History  . Not on file.   Social History Main Topics  . Smoking status: Former Smoker    Quit date: 04/05/1986  . Smokeless tobacco: Never Used     Comment: quit 25-30 yrs ago  . Alcohol use 8.4 oz/week    14 Standard drinks or equivalent per week     Comment: socially  . Drug use: No  . Sexual activity: Not on file   Other Topics Concern  . Not on file   Social History Narrative  . No narrative on file     PHYSICAL EXAM  GENERAL EXAM/CONSTITUTIONAL: Vitals:  Vitals:   05/02/17 1517  BP: (!) 114/59  Pulse: (!) 52  Weight: 172 lb 6.4 oz (78.2 kg)   Body mass index is 23.71 kg/m. No exam data present  Patient is in no distress; well developed, nourished and groomed; neck is supple  NO PALPABLE NECK MASSES  CARDIOVASCULAR:  Examination of carotid arteries is normal; no carotid  bruits  Regular rate and rhythm, no murmurs  Examination of peripheral vascular system by observation and palpation is normal  EYES:  Ophthalmoscopic exam of optic discs and posterior segments is normal; no papilledema or hemorrhages  MUSCULOSKELETAL:  Gait, strength, tone, movements noted in Neurologic exam below  NEUROLOGIC: MENTAL STATUS:  No flowsheet data found.  awake, alert, oriented to person, place and time  recent and remote memory intact  normal attention and concentration  language fluent, comprehension intact, naming intact,   fund of knowledge appropriate  CRANIAL NERVE:   2nd - no papilledema on fundoscopic exam  2nd, 3rd, 4th, 6th - pupils equal and reactive to light, visual fields full to confrontation, extraocular muscles intact, no nystagmus  5th - facial sensation  symmetric  7th - facial strength symmetric  8th - hearing intact  9th - palate elevates symmetrically, uvula midline  11th - shoulder shrug symmetric  12th - tongue protrusion midline  MOTOR:   normal bulk and tone, full strength in the BUE, BLE  SENSORY:   normal and symmetric to light touch, temperature, vibration  COORDINATION:   finger-nose-finger, fine finger movements normal  REFLEXES:   deep tendon reflexes present and symmetric; EXCEPT ABSENT AT ANKLES  GAIT/STATION:   narrow based gait; able to walk tandem; romberg is negative    DIAGNOSTIC DATA (LABS, IMAGING, TESTING) - I reviewed patient records, labs, notes, testing and imaging myself where available.  Lab Results  Component Value Date   WBC 6.4 02/23/2017   HGB 13.7 02/23/2017   HCT 40.8 02/23/2017   MCV 97 02/23/2017   PLT 282 02/23/2017      Component Value Date/Time   NA 142 02/23/2017 1023   K 4.4 02/23/2017 1023   CL 105 02/23/2017 1023   CO2 23 02/23/2017 1023   GLUCOSE 88 02/23/2017 1023   BUN 9 02/23/2017 1023   CREATININE 0.84 02/23/2017 1023   CALCIUM 8.9 02/23/2017 1023    PROT 6.4 02/23/2017 1023   ALBUMIN 4.3 02/23/2017 1023   AST 23 02/23/2017 1023   ALT 17 02/23/2017 1023   ALKPHOS 45 02/23/2017 1023   BILITOT 0.3 02/23/2017 1023   GFRNONAA 88 02/23/2017 1023   GFRAA 102 02/23/2017 1023   Lab Results  Component Value Date   CHOL 170 02/23/2017   HDL 63 02/23/2017   LDLCALC 96 02/23/2017   TRIG 56 02/23/2017   CHOLHDL 2.7 02/23/2017   Lab Results  Component Value Date   HGBA1C 5.5 04/05/2016   Lab Results  Component Value Date   VITAMINB12 369 04/05/2016   Lab Results  Component Value Date   TSH 2.530 02/23/2017     03/18/16 MRI brain [I reviewed images myself and agree with interpretation. -VRP]  - Mild mucosal thickening in the ethmoid sinuses. Negative for mass lesion or other cause of anosmia. - Mild chronic microvascular ischemic change in the white matter. No acute intracranial abnormality.  01/29/16 CT soft tissue neck [I reviewed images myself and agree with interpretation. -VRP]  - No neck mass, acute abnormality, or etiology of dysphagia identified. - Moderate disc degeneration throughout the lower cervical spine.  11/17/16 MRI cervical spine [I reviewed images myself and agree with interpretation. -VRP]  1. C5-6 left paracentral endplate spur exerts the greatest mass effect on the esophagus. There was no associated obstruction or stasis on swallowing function study 10/10/2016 and relationship to globus sensation is uncertain. 2. Degenerative foraminal narrowing most notable at C3-4 and C4-5 bilaterally. 3. Diffusely patent spinal canal.    ASSESSMENT AND PLAN  71 y.o. year old male here with abnormal lump, foreign body, globus sensation in the throat since 2017. Neurologic examination unremarkable except for decreased reflexes in the ankles. ENT, GI and now neurology evaluation have not identified specific etiology. No specific neurologic explanation is found.    Dx:  1. Globus sensation      PLAN:  I spent 15  minutes of face to face time with patient. Greater than 50% of time was spent in counseling and coordination of care with patient. In summary we discussed:   - monitor symptoms  - follow up with PCP   - follow up with psychiatry/psychology re: anxiety and PTSD  Return for return to PCP.  Penni Bombard, MD 04/17/8870, 9:59 PM Certified in Neurology, Neurophysiology and Neuroimaging  [facility] [address], [place], [place] [address] [phone]

## 2017-05-30 ENCOUNTER — Encounter: Payer: PPO | Admitting: Internal Medicine

## 2017-06-12 DIAGNOSIS — R0602 Shortness of breath: Secondary | ICD-10-CM | POA: Diagnosis not present

## 2017-06-12 DIAGNOSIS — I495 Sick sinus syndrome: Secondary | ICD-10-CM | POA: Diagnosis not present

## 2017-06-12 DIAGNOSIS — R001 Bradycardia, unspecified: Secondary | ICD-10-CM | POA: Diagnosis not present

## 2017-06-12 DIAGNOSIS — R55 Syncope and collapse: Secondary | ICD-10-CM | POA: Insufficient documentation

## 2017-06-12 DIAGNOSIS — J449 Chronic obstructive pulmonary disease, unspecified: Secondary | ICD-10-CM | POA: Diagnosis not present

## 2017-06-12 DIAGNOSIS — Z720 Tobacco use: Secondary | ICD-10-CM | POA: Diagnosis not present

## 2017-06-20 ENCOUNTER — Ambulatory Visit (INDEPENDENT_AMBULATORY_CARE_PROVIDER_SITE_OTHER): Payer: PPO | Admitting: Internal Medicine

## 2017-06-20 ENCOUNTER — Encounter: Payer: Self-pay | Admitting: Internal Medicine

## 2017-06-20 VITALS — BP 126/80 | HR 72 | Ht 71.5 in | Wt 173.0 lb

## 2017-06-20 DIAGNOSIS — I495 Sick sinus syndrome: Secondary | ICD-10-CM | POA: Diagnosis not present

## 2017-06-20 DIAGNOSIS — R1319 Other dysphagia: Secondary | ICD-10-CM

## 2017-06-20 NOTE — Progress Notes (Signed)
Date:  06/20/2017   Name:  Timothy Scott   DOB:  1946/02/01   MRN:  [id number]   Chief Complaint: Trouble Swallowing Timothy Scott continues to have issues with swallowing. However he recently saw a chiropractor who gave him some exercises and stretching seems to have helped to a degree. He is here because his daughter showed his MRI to a neurosurgeon in [place]. The neurosurgeon felt he might be able to help Timothy Scott and requested a CT scan be obtained.  Sick Sinus syndrome - he is seeing his cardiologist in the near future. They are encouraging him to have a pacemaker placed for sick sinus syndrome. He does admit to some fatigue and shortness of breath with exertion.  Review of Systems  Constitutional: Negative for chills, fatigue, fever and unexpected weight change.  Respiratory: Positive for choking. Negative for chest tightness, shortness of breath and wheezing.   Cardiovascular: Negative for chest pain, palpitations and leg swelling.  Gastrointestinal: Negative for abdominal pain.  Neurological: Negative for dizziness and headaches.    Patient Active Problem List   Diagnosis Date Noted  . Sick sinus syndrome ([place]) 03/09/2017  . Vertigo 12/12/2016  . Anosmia 05/05/2016  . Dysphagia 02/03/2016  . Tobacco use disorder, moderate, in sustained remission 03/11/2015  . Lumbar disc herniation with radiculopathy 03/11/2015  . Gonalgia 03/10/2015  . DDD (degenerative disc disease), cervical 03/10/2015  . Difficult or painful urination 03/10/2015  . COPD, mild ([place]) 03/10/2015  . Degenerative arthritis of finger 03/10/2015  . Post-traumatic stress disorder 03/10/2015  . Lattice degeneration 03/10/2015  . Leg varices 03/10/2015  . Sinus bradycardia 04/17/2014    Prior to Admission medications   Medication Sig Start Date End Date Taking? Authorizing Provider  azelastine (ASTELIN) 0.1 % nasal spray  11/18/15  Yes [provider]  Calcium Carb-Cholecalciferol 600-800  MG-UNIT CHEW Chew by mouth.   Yes [provider]  Calcium Carbonate-Vit D-Min (CALCIUM 1200 PO) Take 1 mg by mouth.   Yes [provider]  finasteride (PROSCAR) 5 MG tablet Take 5 mg by mouth daily.   Yes [provider]  fluticasone (FLONASE) 50 MCG/ACT nasal spray Place 2 sprays into both nostrils daily. Reported on 03/01/2016   Yes [provider]  gabapentin (NEURONTIN) 100 MG capsule  02/15/17  Yes [provider]  magnesium oxide (MAG-OX) 400 MG tablet Take 400 mg by mouth daily.   Yes [provider]  meclizine (ANTIVERT) 25 MG tablet  12/12/16  Yes [provider]  naproxen (NAPROSYN) 500 MG tablet Take 500 mg by mouth 2 (two) times daily with a meal.   Yes [provider]  Probiotic Product (PROBIOTIC DAILY PO) Take by mouth.   Yes [provider]  Saw Palmetto 450 MG CAPS Take by mouth daily.   Yes [provider]  tamsulosin (FLOMAX) 0.4 MG CAPS capsule Take 0.4 mg by mouth.   Yes [provider]  traZODone (DESYREL) 50 MG tablet Take 50 mg by mouth at bedtime.   Yes [provider]  Turmeric 500 MG TABS Take by mouth daily.   Yes [provider]  valACYclovir (VALTREX) 1000 MG tablet Take 1 tablet (1,000 mg total) by mouth 3 (three) times daily. 04/28/17  Yes Glean Hess, MD    No Known Allergies  Past Surgical History:  Procedure Laterality Date  . CATARACT EXTRACTION Right 2015  . CATARACT EXTRACTION W/PHACO Left 10/21/2015   Procedure: CATARACT EXTRACTION PHACO AND  INTRAOCULAR LENS PLACEMENT (IOC);  Surgeon: Leandrew Koyanagi, MD;  Location: [place];  Service: Ophthalmology;  Laterality: Left;  . COLONOSCOPY    . COLONOSCOPY  2014   benign polyps  . CYSTOSCOPY    . ESOPHAGEAL MANOMETRY N/A 08/03/2016   Procedure: ESOPHAGEAL MANOMETRY (EM);  Surgeon: Mauri Pole, MD;  [facility];  Service: Endoscopy;  Laterality: N/A;  .  ESOPHAGOGASTRODUODENOSCOPY (EGD) WITH PROPOFOL N/A 02/17/2016   with esoph dilatation  . TONSILLECTOMY      Social History  Substance Use Topics  . Smoking status: Former Smoker    Quit date: 04/05/1986  . Smokeless tobacco: Never Used     Comment: quit 25-30 yrs ago  . Alcohol use 8.4 oz/week    14 Standard drinks or equivalent per week     Comment: socially     Medication list has been reviewed and updated.  PHQ 2/9 Scores 02/23/2017 02/15/2017 01/08/2016 03/11/2015  PHQ - 2 Score 0 1 0 0    Physical Exam  Constitutional: He is oriented to person, place, and time. He appears well-developed. No distress.  HENT:  Head: Normocephalic and atraumatic.  Neck: Normal range of motion. Neck supple. No thyromegaly present.  Cardiovascular: Normal rate, regular rhythm and normal heart sounds.   Pulmonary/Chest: Effort normal. No respiratory distress. He has no wheezes.  Musculoskeletal: Normal range of motion.  Neurological: He is alert and oriented to person, place, and time.  Skin: Skin is warm and dry. No rash noted.  Psychiatric: He has a normal mood and affect. His behavior is normal. Thought content normal.  Nursing note and vitals reviewed.   BP 126/80   Pulse 72   Ht 5' 11.5" (1.816 m)   Wt 173 lb (78.5 kg)   SpO2 98%   BMI 23.79 kg/m   Assessment and Plan: 1. Other dysphagia I will be unable to get a CT covered I recommend that he consult the Neurosurgeon and have him order the studies tht he needed  2. Sick sinus syndrome ([facility]) Follow up for pacemaker - esp in view of possible neurosurgical procedure   No orders of the defined types were placed in this encounter.   Partially dictated using Editor, commissioning. Any errors are unintentional.  Halina Maidens, MD [facility]  06/20/2017

## 2017-06-27 DIAGNOSIS — M542 Cervicalgia: Secondary | ICD-10-CM | POA: Diagnosis not present

## 2017-06-29 DIAGNOSIS — R55 Syncope and collapse: Secondary | ICD-10-CM | POA: Diagnosis not present

## 2017-06-29 DIAGNOSIS — R001 Bradycardia, unspecified: Secondary | ICD-10-CM | POA: Diagnosis not present

## 2017-06-29 DIAGNOSIS — R0602 Shortness of breath: Secondary | ICD-10-CM | POA: Diagnosis not present

## 2017-07-03 DIAGNOSIS — I495 Sick sinus syndrome: Secondary | ICD-10-CM | POA: Diagnosis not present

## 2017-07-03 DIAGNOSIS — J449 Chronic obstructive pulmonary disease, unspecified: Secondary | ICD-10-CM | POA: Diagnosis not present

## 2017-07-03 DIAGNOSIS — I8393 Asymptomatic varicose veins of bilateral lower extremities: Secondary | ICD-10-CM | POA: Diagnosis not present

## 2017-07-03 DIAGNOSIS — R0602 Shortness of breath: Secondary | ICD-10-CM | POA: Diagnosis not present

## 2017-07-03 DIAGNOSIS — Z720 Tobacco use: Secondary | ICD-10-CM | POA: Diagnosis not present

## 2017-07-07 ENCOUNTER — Telehealth: Payer: Self-pay

## 2017-07-07 NOTE — Telephone Encounter (Signed)
Patient called stating he seen [facility] - Spine Doctor . He believes the patient needs to see Endocrinology for this issue he is having with his fatigue, energy levels so wants his thyroid checked. I spoke with Dr Army Melia and informed patient his thyroid levels are checked by Dr Army Melia. His last thyroid was checked in June of this year and that was in normal range. Dr Army Melia said no need to send for thyroid if thyroid labs are normal. He verbalized understanding.

## 2017-07-10 ENCOUNTER — Other Ambulatory Visit: Payer: Self-pay | Admitting: Neurosurgery

## 2017-07-10 DIAGNOSIS — M542 Cervicalgia: Secondary | ICD-10-CM

## 2017-07-14 ENCOUNTER — Ambulatory Visit
Admission: RE | Admit: 2017-07-14 | Discharge: 2017-07-14 | Disposition: A | Discharge status: Home / Self Care | Payer: PPO | Source: Ambulatory Visit | Attending: Neurosurgery | Admitting: Neurosurgery

## 2017-07-14 DIAGNOSIS — M50321 Other cervical disc degeneration at C4-C5 level: Secondary | ICD-10-CM | POA: Insufficient documentation

## 2017-07-14 DIAGNOSIS — M542 Cervicalgia: Secondary | ICD-10-CM | POA: Diagnosis present

## 2017-07-14 DIAGNOSIS — M4802 Spinal stenosis, cervical region: Secondary | ICD-10-CM | POA: Diagnosis not present

## 2017-07-14 DIAGNOSIS — M50221 Other cervical disc displacement at C4-C5 level: Secondary | ICD-10-CM | POA: Insufficient documentation

## 2017-07-18 DIAGNOSIS — M542 Cervicalgia: Secondary | ICD-10-CM | POA: Diagnosis not present

## 2017-08-08 ENCOUNTER — Ambulatory Visit: Payer: PPO | Admitting: Diagnostic Neuroimaging

## 2017-08-25 ENCOUNTER — Encounter: Payer: PPO | Admitting: Internal Medicine

## 2017-09-22 DIAGNOSIS — R1314 Dysphagia, pharyngoesophageal phase: Secondary | ICD-10-CM | POA: Diagnosis not present

## 2017-09-22 DIAGNOSIS — K219 Gastro-esophageal reflux disease without esophagitis: Secondary | ICD-10-CM | POA: Diagnosis not present

## 2017-10-05 ENCOUNTER — Encounter: Payer: Self-pay | Admitting: Urology

## 2017-10-05 ENCOUNTER — Ambulatory Visit (INDEPENDENT_AMBULATORY_CARE_PROVIDER_SITE_OTHER): Payer: PPO | Admitting: Urology

## 2017-10-05 VITALS — BP 119/69 | HR 63 | Ht 71.5 in | Wt 170.0 lb

## 2017-10-05 DIAGNOSIS — R300 Dysuria: Secondary | ICD-10-CM | POA: Diagnosis not present

## 2017-10-05 DIAGNOSIS — R3129 Other microscopic hematuria: Secondary | ICD-10-CM | POA: Diagnosis not present

## 2017-10-05 DIAGNOSIS — N400 Enlarged prostate without lower urinary tract symptoms: Secondary | ICD-10-CM

## 2017-10-05 DIAGNOSIS — Z125 Encounter for screening for malignant neoplasm of prostate: Secondary | ICD-10-CM

## 2017-10-05 LAB — MICROSCOPIC EXAMINATION

## 2017-10-05 LAB — URINALYSIS, COMPLETE
Bilirubin, UA: NEGATIVE
Glucose, UA: NEGATIVE
Ketones, UA: NEGATIVE
NITRITE UA: NEGATIVE
PH UA: 6.5 (ref 5.0–7.5)
Protein, UA: NEGATIVE
Specific Gravity, UA: 1.01 (ref 1.005–1.030)
Urobilinogen, Ur: 0.2 mg/dL (ref 0.2–1.0)

## 2017-10-05 NOTE — Progress Notes (Signed)
10/05/2017 12:01 PM   Timothy Scott 1946-04-01 [id number]  Referring provider: Glean Hess, MD [address] [place], [place] [address]  Chief Complaint  Patient presents with  . Benign Prostatic Hypertrophy    HPI: The patient is a 72 year old gentleman with a past medical history of BPH on Flomax and finasteride presents for annual follow-up.  He does note occasional dysuria that only last seconds.  This occurs a few times per week.  He also has mild urgency.  He has minimal nocturia.  He has a good stream and feels that he empties his bladder.  He is overall happy with his urination.  The patient was noted to have microscopic hematuria on urinalysis today.  No history of gross hematuria.  He had passed a kidney stone a few decades ago.  He smoked up until about 20 years ago.  PSA was 0.2 in June 2018.     PMH: Past Medical History:  Diagnosis Date  . Benign neoplasm of colon   . Chronic airway obstruction, not elsewhere classified   . COPD (chronic obstructive pulmonary disease) ([place])   . DDD (degenerative disc disease), cervical   . Degeneration of cervical intervertebral disc   . Dysphagia   . Dysuria   . 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 Lattice degeneration of peripheral retina   . Leg varices 03/10/2015  . Osteoarthrosis, unspecified whether generalized or localized, hand   . Posttraumatic stress disorder   . Sinus bradycardia 04/17/2014  . Varicose veins of bilateral lower extremities with other complications   . Wears dentures    partial upper and lower    Surgical History: Past Surgical History:  Procedure Laterality Date  . CATARACT EXTRACTION Right 2015  . CATARACT EXTRACTION W/PHACO Left 10/21/2015   Procedure: CATARACT EXTRACTION PHACO AND INTRAOCULAR LENS PLACEMENT (IOC);  Surgeon: Leandrew Koyanagi, MD;  Location: [place];  Service: Ophthalmology;  Laterality: Left;  . COLONOSCOPY    .  COLONOSCOPY  2014   benign polyps  . CYSTOSCOPY    . ESOPHAGEAL MANOMETRY N/A 08/03/2016   Procedure: ESOPHAGEAL MANOMETRY (EM);  Surgeon: Mauri Pole, MD;  [facility];  Service: Endoscopy;  Laterality: N/A;  . ESOPHAGOGASTRODUODENOSCOPY (EGD) WITH PROPOFOL N/A 02/17/2016   with esoph dilatation  . TONSILLECTOMY      Home Medications:  Allergies as of 10/05/2017   No Known Allergies     Medication List        Accurate as of 10/05/17 12:01 PM. Always use your most recent med list.          azelastine 0.1 % nasal spray Commonly known as:  ASTELIN   CALCIUM 1200 PO Take 1 mg by mouth.   Calcium Carb-Cholecalciferol 600-800 MG-UNIT Chew Chew by mouth.   finasteride 5 MG tablet Commonly known as:  PROSCAR Take 5 mg by mouth daily.   fluticasone 50 MCG/ACT nasal spray Commonly known as:  FLONASE Place 2 sprays into both nostrils daily. Reported on 03/01/2016   gabapentin 100 MG capsule Commonly known as:  NEURONTIN   magnesium oxide 400 MG tablet Commonly known as:  MAG-OX Take 400 mg by mouth daily.   meclizine 25 MG tablet Commonly known as:  ANTIVERT   naproxen 500 MG tablet Commonly known as:  NAPROSYN Take 500 mg by mouth 2 (two) times daily with a meal.   PROBIOTIC DAILY PO Take by mouth.   Saw Palmetto 450 MG  Caps Take by mouth daily.   tamsulosin 0.4 MG Caps capsule Commonly known as:  FLOMAX Take 0.4 mg by mouth.   traZODone 50 MG tablet Commonly known as:  DESYREL Take 50 mg by mouth at bedtime.   Turmeric 500 MG Tabs Take by mouth daily.       Allergies: No Known Allergies  Family History: Family History  Problem Relation Age of Onset  . Hypertension Mother   . Prostate cancer Neg Hx   . Kidney cancer Neg Hx   . Bladder Cancer Neg Hx     Social History:  reports that he quit smoking about 31 years ago. he has never used smokeless tobacco. He reports that he drinks about 8.4 oz of alcohol per week. He reports that  he does not use drugs.  ROS: UROLOGY Frequent Urination?: No Hard to postpone urination?: Yes Burning/pain with urination?: Yes Get up at night to urinate?: No Leakage of urine?: No Urine stream starts and stops?: No Trouble starting stream?: No Do you have to strain to urinate?: No Blood in urine?: No Urinary tract infection?: No Sexually transmitted disease?: No Injury to kidneys or bladder?: No Painful intercourse?: No Weak stream?: No Erection problems?: No Penile pain?: No  Gastrointestinal Nausea?: No Vomiting?: No Indigestion/heartburn?: No Diarrhea?: No Constipation?: No  Constitutional Fever: No Night sweats?: No Weight loss?: No Fatigue?: No  Skin Skin rash/lesions?: No Itching?: No  Eyes Blurred vision?: No Double vision?: No  Ears/Nose/Throat Sore throat?: Yes Sinus problems?: No  Hematologic/Lymphatic Swollen glands?: No Easy bruising?: Yes  Cardiovascular Leg swelling?: No Chest pain?: No  Respiratory Cough?: No Shortness of breath?: No  Endocrine Excessive thirst?: No  Musculoskeletal Back pain?: No Joint pain?: Yes  Neurological Headaches?: No Dizziness?: No  Psychologic Depression?: No Anxiety?: No  Physical Exam: BP 119/69 (BP Location: Right Arm, Patient Position: Sitting, Cuff Size: Normal)   Pulse 63   Ht 5' 11.5" (1.816 m)   Wt 170 lb (77.1 kg)   BMI 23.38 kg/m   Constitutional:  Alert and oriented, No acute distress. HEENT: [place] AT, moist mucus membranes.  Trachea midline, no masses. Cardiovascular: No clubbing, cyanosis, or edema. Respiratory: Normal respiratory effort, no increased work of breathing. GI: Abdomen is soft, nontender, nondistended, no abdominal masses GU: No CVA tenderness.  DRE 40 g benign Skin: No rashes, bruises or suspicious lesions. Lymph: No cervical or inguinal adenopathy. Neurologic: Grossly intact, no focal deficits, moving all 4 extremities. Psychiatric: Normal mood and  affect.  Laboratory Data: Lab Results  Component Value Date   WBC 6.4 02/23/2017   HGB 13.7 02/23/2017   HCT 40.8 02/23/2017   MCV 97 02/23/2017   PLT 282 02/23/2017    Lab Results  Component Value Date   CREATININE 0.84 02/23/2017    No results found for: PSA  No results found for: TESTOSTERONE  Lab Results  Component Value Date   HGBA1C 5.5 04/05/2016    Urinalysis    Component Value Date/Time   APPEARANCEUR Clear 01/04/2017 1013   GLUCOSEU Negative 01/04/2017 1013   BILIRUBINUR neg 02/23/2017 0950   BILIRUBINUR Negative 01/04/2017 1013   PROTEINUR neg 02/23/2017 0950   PROTEINUR Negative 01/04/2017 1013   UROBILINOGEN 0.2 02/23/2017 0950   NITRITE neg 02/23/2017 0950   NITRITE Negative 01/04/2017 1013   LEUKOCYTESUR Small (1+) (A) 02/23/2017 0950   LEUKOCYTESUR 1+ (A) 01/04/2017 1013    Assessment & Plan:   1.  BPH Continue Flomax and finasteride  2.  Prostate cancer screening Up-to-date  3.  Microscopic hematuria I discussed the patient the standard workup for microscopic hematuria is a CT urogram followed by office cystoscopy.  He is hesitant to  schedule this currently.  He will call the office if he changes his mind in the next day or 2.  He does understand the risk of missing a malignancy or a kidney stone.  We will tentatively schedule him for 1 year follow-up, but he will call the office if he elects to undergo the microscopic hematuria workup.  Return in about 1 year (around 10/05/2018).  Nickie Retort, MD  [facility] [address] [place], [place] [address] [phone]

## 2017-10-20 ENCOUNTER — Telehealth: Payer: Self-pay | Admitting: Urology

## 2017-10-20 DIAGNOSIS — R3129 Other microscopic hematuria: Secondary | ICD-10-CM

## 2017-10-20 NOTE — Telephone Encounter (Signed)
Pt called and would like to proceed with CT and Cysto.  Please put order in for CT scan and we will schedule pt for CT and results.  Thanks.

## 2017-10-23 NOTE — Telephone Encounter (Signed)
Spoke with pt in reference to CT and cysto. Pt voiced understanding. Orders placed. Cysto app previously made.

## 2017-10-31 ENCOUNTER — Ambulatory Visit
Admission: RE | Admit: 2017-10-31 | Discharge: 2017-10-31 | Disposition: A | Discharge status: Home / Self Care | Payer: PPO | Source: Ambulatory Visit | Attending: Urology | Admitting: Urology

## 2017-10-31 DIAGNOSIS — N400 Enlarged prostate without lower urinary tract symptoms: Secondary | ICD-10-CM | POA: Diagnosis not present

## 2017-10-31 DIAGNOSIS — I700 Atherosclerosis of aorta: Secondary | ICD-10-CM | POA: Insufficient documentation

## 2017-10-31 DIAGNOSIS — K7689 Other specified diseases of liver: Secondary | ICD-10-CM | POA: Insufficient documentation

## 2017-10-31 DIAGNOSIS — R3129 Other microscopic hematuria: Secondary | ICD-10-CM | POA: Diagnosis not present

## 2017-10-31 LAB — POCT I-STAT CREATININE: Creatinine, Ser: 0.9 mg/dL (ref 0.61–1.24)

## 2017-10-31 MED ORDER — IOPAMIDOL (ISOVUE-300) INJECTION 61%
125.0000 mL | Freq: Once | INTRAVENOUS | Status: AC | PRN
Start: 2017-10-31 — End: 2017-10-31
  Administered 2017-10-31: 125 mL via INTRAVENOUS

## 2017-11-03 ENCOUNTER — Ambulatory Visit (INDEPENDENT_AMBULATORY_CARE_PROVIDER_SITE_OTHER): Payer: PPO | Admitting: Urology

## 2017-11-03 ENCOUNTER — Encounter: Payer: Self-pay | Admitting: Urology

## 2017-11-03 VITALS — BP 130/69 | HR 60 | Resp 16 | Ht 71.0 in | Wt 170.7 lb

## 2017-11-03 DIAGNOSIS — R3129 Other microscopic hematuria: Secondary | ICD-10-CM | POA: Diagnosis not present

## 2017-11-03 LAB — URINALYSIS, COMPLETE
Bilirubin, UA: NEGATIVE
GLUCOSE, UA: NEGATIVE
KETONES UA: NEGATIVE
Nitrite, UA: NEGATIVE
Protein, UA: NEGATIVE
SPEC GRAV UA: 1.01 (ref 1.005–1.030)
UUROB: 0.2 mg/dL (ref 0.2–1.0)
pH, UA: 5.5 (ref 5.0–7.5)

## 2017-11-03 MED ORDER — CIPROFLOXACIN HCL 500 MG PO TABS
500.0000 mg | ORAL_TABLET | Freq: Once | ORAL | Status: AC
  Administered 2017-11-03: 500 mg via ORAL

## 2017-11-03 MED ORDER — LIDOCAINE HCL 2 % EX GEL
1.0000 "application " | Freq: Once | CUTANEOUS | Status: AC
  Administered 2017-11-03: 1 via URETHRAL

## 2017-11-03 NOTE — Progress Notes (Signed)
   11/03/17  CC:  Chief Complaint  Patient presents with  . Cysto    HPI: The patient is a 72 year old gentleman with a past medical history of BPH on Flomax and finasteride presents for cystoscopy for microscopic hematuria.  His CT was unremarkable.  Please see note dated October 05, 2017 for his full urological history.  There were no vitals taken for this visit. NED. A&Ox3.   No respiratory distress   Abd soft, NT, ND Normal phallus with bilateral descended testicles  Cystoscopy Procedure Note  Patient identification was confirmed, informed consent was obtained, and patient was prepped using Betadine solution.  Lidocaine jelly was administered per urethral meatus.    Preoperative abx where received prior to procedure.     Pre-Procedure: - Inspection reveals a normal caliber ureteral meatus.  Procedure: The flexible cystoscope was introduced without difficulty - No urethral strictures/lesions are present. - Enlarged prostate Visually obstructive ~5 cm in length - Normal bladder neck - Bilateral ureteral orifices identified - Bladder mucosa  reveals no ulcers, tumors, or lesions - No bladder stones - Mild trabeculation with small saccules in the posterior well  Retroflexion shows intravesical lobe noted   Post-Procedure: - Patient tolerated the procedure well  Assessment/ Plan:  1. Microscopic hematuria Negative workup.  Repeat urinalysis in 1 year.  2.  BPH Continue Flomax and finasteride  3.  Prostate cancer screening Up-to-date

## 2017-12-13 LAB — BASIC METABOLIC PANEL
BUN: 13 (ref 4–21)
Creatinine: 0.8 (ref 0.6–1.3)
Glucose: 91
Potassium: 4.8 (ref 3.4–5.3)
Sodium: 144 (ref 137–147)

## 2017-12-13 LAB — HEPATIC FUNCTION PANEL
ALT: 23 (ref 10–40)
AST: 17 (ref 14–40)
Alkaline Phosphatase: 48 (ref 25–125)
BILIRUBIN, TOTAL: 0.5

## 2017-12-13 LAB — VITAMIN D 25 HYDROXY (VIT D DEFICIENCY, FRACTURES): Vit D, 25-Hydroxy: 29.1

## 2017-12-13 LAB — VITAMIN B12: VITAMIN B 12: 331

## 2017-12-13 LAB — TSH: TSH: 1.88 (ref 0.41–5.90)

## 2017-12-13 LAB — PSA: PSA: 0.2

## 2017-12-29 ENCOUNTER — Ambulatory Visit: Payer: PPO | Admitting: Internal Medicine

## 2018-01-17 ENCOUNTER — Encounter: Payer: Self-pay | Admitting: Internal Medicine

## 2018-01-17 ENCOUNTER — Ambulatory Visit (INDEPENDENT_AMBULATORY_CARE_PROVIDER_SITE_OTHER): Payer: PPO | Admitting: Internal Medicine

## 2018-01-17 VITALS — BP 98/74 | HR 55 | Resp 16 | Ht 71.0 in | Wt 167.0 lb

## 2018-01-17 DIAGNOSIS — L821 Other seborrheic keratosis: Secondary | ICD-10-CM | POA: Diagnosis not present

## 2018-01-17 DIAGNOSIS — M5030 Other cervical disc degeneration, unspecified cervical region: Secondary | ICD-10-CM

## 2018-01-17 DIAGNOSIS — K5792 Diverticulitis of intestine, part unspecified, without perforation or abscess without bleeding: Secondary | ICD-10-CM

## 2018-01-17 DIAGNOSIS — L578 Other skin changes due to chronic exposure to nonionizing radiation: Secondary | ICD-10-CM | POA: Diagnosis not present

## 2018-01-17 DIAGNOSIS — L570 Actinic keratosis: Secondary | ICD-10-CM | POA: Diagnosis not present

## 2018-01-17 MED ORDER — AMOXICILLIN-POT CLAVULANATE 875-125 MG PO TABS: 1.0000 | ORAL_TABLET | Freq: Two times a day (BID) | ORAL | 0 refills | Status: AC

## 2018-01-17 NOTE — Patient Instructions (Signed)
Gabapentin 200 mg at bedtime for 4-5 days then add 200 mg in the morning for several days.  Maximum - 400 mg at bedtime and 200-300 in the morning.

## 2018-01-17 NOTE — Progress Notes (Signed)
Date:  01/17/2018   Name:  Timothy Scott   DOB:  1946/08/04   MRN:  [id number]   Chief Complaint: Abdominal Cramping (2 weeks of cramping with some diarrhea. Pain started over last few days. Denies NV and has had no fever no chills. No changes in appetite. ) and Back Pain (Was on 300 mg 3 times daily in past but did not give it a chance and wants to discuss going back on this. )  Abdominal Pain  This is a new problem. The current episode started 1 to 4 weeks ago. The onset quality is gradual. The problem occurs daily. The problem has been waxing and waning. The pain is located in the epigastric region. The pain is mild. The quality of the pain is cramping. Associated symptoms include diarrhea. Pertinent negatives include no constipation, fever, headaches, hematochezia, hematuria, nausea, vomiting or weight loss. He has tried H2 blockers and antacids for the symptoms. The treatment provided mild relief.  Neck Pain   This is a chronic problem. The problem occurs daily. The problem has been unchanged. The pain is present in the left side. The quality of the pain is described as shooting and stabbing. The pain is moderate. Associated symptoms include tingling (in left upper arm). Pertinent negatives include no chest pain, fever, headaches, weakness or weight loss. Associated symptoms comments: And possibly related to dysphagia.     Review of Systems  Constitutional: Negative for fever and weight loss.  Respiratory: Negative for chest tightness, shortness of breath and wheezing.   Cardiovascular: Negative for chest pain and palpitations.  Gastrointestinal: Positive for abdominal pain and diarrhea. Negative for blood in stool, constipation, hematochezia, nausea and vomiting.  Genitourinary: Negative for hematuria.  Musculoskeletal: Positive for neck pain.  Neurological: Positive for tingling (in left upper arm). Negative for dizziness, tremors, weakness and headaches.    Patient Active  Problem List   Diagnosis Date Noted  . Sick sinus syndrome ([place]) 03/09/2017  . Vertigo 12/12/2016  . Anosmia 05/05/2016  . Dysphagia 02/03/2016  . Tobacco use disorder, moderate, in sustained remission 03/11/2015  . Lumbar disc herniation with radiculopathy 03/11/2015  . Gonalgia 03/10/2015  . DDD (degenerative disc disease), cervical 03/10/2015  . Difficult or painful urination 03/10/2015  . COPD, mild ([place]) 03/10/2015  . Degenerative arthritis of finger 03/10/2015  . Post-traumatic stress disorder 03/10/2015  . Lattice degeneration 03/10/2015  . Leg varices 03/10/2015  . Sinus bradycardia 04/17/2014    Prior to Admission medications   Medication Sig Start Date End Date Taking? Authorizing Provider  azelastine (ASTELIN) 0.1 % nasal spray  11/18/15  Yes [provider]  Calcium Carbonate-Vit D-Min (CALCIUM 1200 PO) Take 1 mg by mouth.   Yes [provider]  finasteride (PROSCAR) 5 MG tablet Take 5 mg by mouth daily.   Yes [provider]  fluticasone (FLONASE) 50 MCG/ACT nasal spray Place 2 sprays into both nostrils daily. Reported on 03/01/2016   Yes [provider]  glucosamine-chondroitin 500-400 MG tablet Take 1 tablet by mouth daily.   Yes [provider]  Loperamide HCl (IMODIUM A-D PO) Take by mouth as needed.    Yes [provider]  magnesium oxide (MAG-OX) 400 MG tablet Take 400 mg by mouth daily.   Yes [provider]  naproxen (NAPROSYN) 500 MG tablet Take 500 mg by mouth 2 (two) times daily with a meal.   Yes [provider]  omeprazole (PRILOSEC) 40 MG capsule  10/30/17  Yes [provider]  Probiotic Product (PROBIOTIC DAILY PO) Take by mouth.   Yes [provider]  Saw Palmetto 450 MG CAPS Take by mouth daily.   Yes [provider]  tamsulosin (FLOMAX) 0.4 MG CAPS capsule Take 0.4 mg by mouth.   Yes [provider]  traZODone (DESYREL) 50 MG tablet Take 50 mg by  mouth at bedtime.   Yes [provider]  gabapentin (NEURONTIN) 100 MG capsule Take 300 mg by mouth 3 (three) times daily.  02/15/17   [provider]    No Known Allergies  Past Surgical History:  Procedure Laterality Date  . CATARACT EXTRACTION Right 2015  . CATARACT EXTRACTION W/PHACO Left 10/21/2015   Procedure: CATARACT EXTRACTION PHACO AND INTRAOCULAR LENS PLACEMENT (IOC);  Surgeon: Leandrew Koyanagi, MD;  Location: [facility];  Service: Ophthalmology;  Laterality: Left;  . COLONOSCOPY    . COLONOSCOPY  2014   benign polyps  . CYSTOSCOPY    . ESOPHAGEAL MANOMETRY N/A 08/03/2016   Procedure: ESOPHAGEAL MANOMETRY (EM);  Surgeon: Mauri Pole, MD;  Location: [facility];  Service: Endoscopy;  Laterality: N/A;  . ESOPHAGOGASTRODUODENOSCOPY (EGD) WITH PROPOFOL N/A 02/17/2016   with esoph dilatation  . TONSILLECTOMY      Social History   Tobacco Use  . Smoking status: Former Smoker    Last attempt to quit: 04/05/1986    Years since quitting: 31.8  . Smokeless tobacco: Never Used  . Tobacco comment: quit 25-30 yrs ago  Substance Use Topics  . Alcohol use: Yes    Alcohol/week: 8.4 oz    Types: 14 Standard drinks or equivalent per week    Comment: socially  . Drug use: No     Medication list has been reviewed and updated.  PHQ 2/9 Scores 02/23/2017 02/15/2017 01/08/2016 03/11/2015  PHQ - 2 Score 0 1 0 0    Physical Exam  Constitutional: He appears well-developed and well-nourished.  Eyes: Pupils are equal, round, and reactive to light.  Cardiovascular: Normal rate, regular rhythm and normal heart sounds.  Pulmonary/Chest: Effort normal and breath sounds normal.  Abdominal: Soft. Normal appearance. Bowel sounds are increased. There is tenderness in the left lower quadrant. There is no rigidity and no guarding. No hernia.  Musculoskeletal: He exhibits no edema.       Cervical back: He exhibits decreased range of motion, tenderness and spasm.    Lymphadenopathy:    He has no cervical adenopathy.    BP 98/74   Pulse (!) 55   Resp 16   Ht 5\' 11"  (1.803 m)   Wt 167 lb (75.8 kg)   SpO2 98%   BMI 23.29 kg/m   Assessment and Plan: 1. Diverticulitis Continue probiotics - amoxicillin-clavulanate (AUGMENTIN) 875-125 MG tablet; Take 1 tablet by mouth 2 (two) times daily for 10 days.  Dispense: 20 tablet; Refill: 0  2. DDD (degenerative disc disease), cervical Start taper up on gabapentin   Meds ordered this encounter  Medications  . amoxicillin-clavulanate (AUGMENTIN) 875-125 MG tablet    Sig: Take 1 tablet by mouth 2 (two) times daily for 10 days.    Dispense:  20 tablet    Refill:  0    Partially dictated using Editor, commissioning. Any errors are unintentional.  Halina Maidens, MD [facility]  01/17/2018

## 2018-02-21 DIAGNOSIS — K228 Other specified diseases of esophagus: Secondary | ICD-10-CM | POA: Diagnosis not present

## 2018-02-21 DIAGNOSIS — M5412 Radiculopathy, cervical region: Secondary | ICD-10-CM | POA: Diagnosis not present

## 2018-02-27 ENCOUNTER — Telehealth: Payer: Self-pay | Admitting: Internal Medicine

## 2018-02-27 NOTE — Telephone Encounter (Signed)
Called to schedule Medicare Annual Wellness Visit with Nurse Health Advisor. If patient returns call, please note: please schedule AWV with NHA any date   Thank you! For any questions please contact: Jill Alexanders [phone]  Or Skype me at: [email or web address]

## 2018-02-28 ENCOUNTER — Ambulatory Visit: Payer: PPO

## 2018-03-01 DIAGNOSIS — R293 Abnormal posture: Secondary | ICD-10-CM | POA: Diagnosis not present

## 2018-03-01 DIAGNOSIS — M5412 Radiculopathy, cervical region: Secondary | ICD-10-CM | POA: Diagnosis not present

## 2018-03-06 DIAGNOSIS — R293 Abnormal posture: Secondary | ICD-10-CM | POA: Diagnosis not present

## 2018-03-06 DIAGNOSIS — M5412 Radiculopathy, cervical region: Secondary | ICD-10-CM | POA: Diagnosis not present

## 2018-03-14 DIAGNOSIS — R293 Abnormal posture: Secondary | ICD-10-CM | POA: Diagnosis not present

## 2018-03-14 DIAGNOSIS — M5412 Radiculopathy, cervical region: Secondary | ICD-10-CM | POA: Diagnosis not present

## 2018-03-19 ENCOUNTER — Telehealth: Payer: Self-pay | Admitting: Internal Medicine

## 2018-03-19 ENCOUNTER — Ambulatory Visit: Payer: PPO

## 2018-03-19 NOTE — Telephone Encounter (Signed)
Called to RE schedule Medicare Annual Wellness Visit with Nurse Health Advisor Thank you! For any questions please contact: Jill Alexanders [phone]  Or Skype me at: [email or web address]

## 2018-03-21 ENCOUNTER — Ambulatory Visit (INDEPENDENT_AMBULATORY_CARE_PROVIDER_SITE_OTHER): Payer: PPO

## 2018-03-21 VITALS — BP 110/60 | HR 60 | Temp 98.2°F | Resp 12 | Ht 71.0 in | Wt 167.2 lb

## 2018-03-21 DIAGNOSIS — Z0000 Encounter for general adult medical examination without abnormal findings: Secondary | ICD-10-CM

## 2018-03-21 NOTE — Patient Instructions (Addendum)
Mr. Timothy Scott , Thank you for taking time to come for your Medicare Wellness Visit. I appreciate your ongoing commitment to your health goals. Please review the following plan we discussed and let me know if I can assist you in the future.   Screening recommendations/referrals: Colorectal Screening: Up to date  Vision and Dental Exams: Recommended annual ophthalmology exams for early detection of glaucoma and other disorders of the eye Recommended annual dental exams for proper oral hygiene  Vaccinations: Influenza vaccine: Up to date Pneumococcal vaccine: Up to date Tdap vaccine: Up to date Shingles vaccine: Please call your insurance company to determine your out of pocket expense for the Shingrix vaccine. You may also receive this vaccine at your local pharmacy or Health Dept.    Advanced directives: Please bring a copy of your POA (Power of Attorney) and/or Living Will to your next appointment.  Goals: Recommend to drink at least 6-8 8oz glasses of water per day.  Next appointment: Please schedule your Annual Wellness Visit with your Nurse Health Advisor in one year.  Preventive Care 73 Years and Older, Male Preventive care refers to lifestyle choices and visits with your health care provider that can promote health and wellness. What does preventive care include?  A yearly physical exam. This is also called an annual well check.  Dental exams once or twice a year.  Routine eye exams. Ask your health care provider how often you should have your eyes checked.  Personal lifestyle choices, including:  Daily care of your teeth and gums.  Regular physical activity.  Eating a healthy diet.  Avoiding tobacco and drug use.  Limiting alcohol use.  Practicing safe sex.  Taking low doses of aspirin every day.  Taking vitamin and mineral supplements as recommended by your health care provider. What happens during an annual well check? The services and screenings done by your  health care provider during your annual well check will depend on your age, overall health, lifestyle risk factors, and family history of disease. Counseling  Your health care provider may ask you questions about your:  Alcohol use.  Tobacco use.  Drug use.  Emotional well-being.  Home and relationship well-being.  Sexual activity.  Eating habits.  History of falls.  Memory and ability to understand (cognition).  Work and work Statistician. Screening  You may have the following tests or measurements:  Height, weight, and BMI.  Blood pressure.  Lipid and cholesterol levels. These may be checked every 5 years, or more frequently if you are over 65 years old.  Skin check.  Lung cancer screening. You may have this screening every year starting at age 30 if you have a 30-pack-year history of smoking and currently smoke or have quit within the past 15 years.  Fecal occult blood test (FOBT) of the stool. You may have this test every year starting at age 73.  Flexible sigmoidoscopy or colonoscopy. You may have a sigmoidoscopy every 5 years or a colonoscopy every 10 years starting at age 73.  Prostate cancer screening. Recommendations will vary depending on your family history and other risks.  Hepatitis C blood test.  Hepatitis B blood test.  Sexually transmitted disease (STD) testing.  Diabetes screening. This is done by checking your blood sugar (glucose) after you have not eaten for a while (fasting). You may have this done every 1-3 years.  Abdominal aortic aneurysm (AAA) screening. You may need this if you are a current or former smoker.  Osteoporosis. You may be  screened starting at age 73 if you are at high risk. Talk with your health care provider about your test results, treatment options, and if necessary, the need for more tests. Vaccines  Your health care provider may recommend certain vaccines, such as:  Influenza vaccine. This is recommended every  year.  Tetanus, diphtheria, and acellular pertussis (Tdap, Td) vaccine. You may need a Td booster every 10 years.  Zoster vaccine. You may need this after age 73.  Pneumococcal 13-valent conjugate (PCV13) vaccine. One dose is recommended after age 73.  Pneumococcal polysaccharide (PPSV23) vaccine. One dose is recommended after age 73. Talk to your health care provider about which screenings and vaccines you need and how often you need them. This information is not intended to replace advice given to you by your health care provider. Make sure you discuss any questions you have with your health care provider. Document Released: 09/18/2015 Document Revised: 05/11/2016 Document Reviewed: 06/23/2015 Elsevier Interactive Patient Education  2017 [employer] Prevention in the Home Falls can cause injuries. They can happen to people of all ages. There are many things you can do to make your home safe and to help prevent falls. What can I do on the outside of my home?  Regularly fix the edges of walkways and driveways and fix any cracks.  Remove anything that might make you trip as you walk through a door, such as a raised step or threshold.  Trim any bushes or trees on the path to your home.  Use bright outdoor lighting.  Clear any walking paths of anything that might make someone trip, such as rocks or tools.  Regularly check to see if handrails are loose or broken. Make sure that both sides of any steps have handrails.  Any raised decks and porches should have guardrails on the edges.  Have any leaves, snow, or ice cleared regularly.  Use sand or salt on walking paths during winter.  Clean up any spills in your garage right away. This includes oil or grease spills. What can I do in the bathroom?  Use night lights.  Install grab bars by the toilet and in the tub and shower. Do not use towel bars as grab bars.  Use non-skid mats or decals in the tub or shower.  If you  need to sit down in the shower, use a plastic, non-slip stool.  Keep the floor dry. Clean up any water that spills on the floor as soon as it happens.  Remove soap buildup in the tub or shower regularly.  Attach bath mats securely with double-sided non-slip rug tape.  Do not have throw rugs and other things on the floor that can make you trip. What can I do in the bedroom?  Use night lights.  Make sure that you have a light by your bed that is easy to reach.  Do not use any sheets or blankets that are too big for your bed. They should not hang down onto the floor.  Have a firm chair that has side arms. You can use this for support while you get dressed.  Do not have throw rugs and other things on the floor that can make you trip. What can I do in the kitchen?  Clean up any spills right away.  Avoid walking on wet floors.  Keep items that you use a lot in easy-to-reach places.  If you need to reach something above you, use a strong step stool that has a  grab bar.  Keep electrical cords out of the way.  Do not use floor polish or wax that makes floors slippery. If you must use wax, use non-skid floor wax.  Do not have throw rugs and other things on the floor that can make you trip. What can I do with my stairs?  Do not leave any items on the stairs.  Make sure that there are handrails on both sides of the stairs and use them. Fix handrails that are broken or loose. Make sure that handrails are as long as the stairways.  Check any carpeting to make sure that it is firmly attached to the stairs. Fix any carpet that is loose or worn.  Avoid having throw rugs at the top or bottom of the stairs. If you do have throw rugs, attach them to the floor with carpet tape.  Make sure that you have a light switch at the top of the stairs and the bottom of the stairs. If you do not have them, ask someone to add them for you. What else can I do to help prevent falls?  Wear shoes  that:  Do not have high heels.  Have rubber bottoms.  Are comfortable and fit you well.  Are closed at the toe. Do not wear sandals.  If you use a stepladder:  Make sure that it is fully opened. Do not climb a closed stepladder.  Make sure that both sides of the stepladder are locked into place.  Ask someone to hold it for you, if possible.  Clearly mark and make sure that you can see:  Any grab bars or handrails.  First and last steps.  Where the edge of each step is.  Use tools that help you move around (mobility aids) if they are needed. These include:  Canes.  Walkers.  Scooters.  Crutches.  Turn on the lights when you go into a dark area. Replace any light bulbs as soon as they burn out.  Set up your furniture so you have a clear path. Avoid moving your furniture around.  If any of your floors are uneven, fix them.  If there are any pets around you, be aware of where they are.  Review your medicines with your doctor. Some medicines can make you feel dizzy. This can increase your chance of falling. Ask your doctor what other things that you can do to help prevent falls. This information is not intended to replace advice given to you by your health care provider. Make sure you discuss any questions you have with your health care provider. Document Released: 06/18/2009 Document Revised: 01/28/2016 Document Reviewed: 09/26/2014 Elsevier Interactive Patient Education  2017 [employer].

## 2018-03-21 NOTE — Progress Notes (Signed)
Subjective:   Timothy Scott is a 72 y.o. male who presents for Medicare Annual/Subsequent preventive examination.  Review of Systems:  N/A Cardiac Risk Factors include: advanced age (>57men, >51 women);male gender     Objective:    Vitals: BP 110/60 (BP Location: Left Arm, Patient Position: Sitting, Cuff Size: Normal)   Pulse 60   Temp 98.2 F (36.8 C) (Oral)   Resp 12   Ht 5\' 11"  (1.803 m)   Wt 167 lb 3.2 oz (75.8 kg)   SpO2 95%   BMI 23.32 kg/m   Body mass index is 23.32 kg/m.  Advanced Directives 03/21/2018 05/05/2016 02/17/2016 10/21/2015  Does Patient Have a Medical Advance Directive? Yes No No Yes  Type of Paramedic of [place];Living will - - -  Copy of [place] in Chart? No - copy requested - - -    Tobacco Social History   Tobacco Use  Smoking Status Former Smoker  . Packs/day: 0.50  . Years: 20.00  . Pack years: 10.00  . Types: Cigarettes, Pipe, Cigars  . Last attempt to quit: 04/05/1986  . Years since quitting: 31.9  Smokeless Tobacco Never Used  Tobacco Comment   smoking cessation materials not required     Counseling given: No Comment: smoking cessation materials not required  Clinical Intake:  Pre-visit preparation completed: Yes  Pain : No/denies pain   BMI - recorded: 23.32 Nutritional Status: BMI of 19-24  Normal Nutritional Risks: None Diabetes: No  How often do you need to have someone help you when you read instructions, pamphlets, or other written materials from your doctor or pharmacy?: 1 - Never  Interpreter Needed?: No  Information entered by :: AEversole, LPN  Past Medical History:  Diagnosis Date  . Benign neoplasm of colon   . Chronic airway obstruction, not elsewhere classified   . COPD (chronic obstructive pulmonary disease) ([place])   . DDD (degenerative disc disease), cervical   . Degeneration of cervical intervertebral disc   . Dysphagia   . Dysuria   . 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 Lattice degeneration of peripheral retina   . Leg varices 03/10/2015  . Osteoarthrosis, unspecified whether generalized or localized, hand   . Posttraumatic stress disorder   . Sinus bradycardia 04/17/2014  . Varicose veins of bilateral lower extremities with other complications   . Wears dentures    partial upper and lower   Past Surgical History:  Procedure Laterality Date  . CATARACT EXTRACTION Right 2015  . CATARACT EXTRACTION W/PHACO Left 10/21/2015   Procedure: CATARACT EXTRACTION PHACO AND INTRAOCULAR LENS PLACEMENT (IOC);  Surgeon: Leandrew Koyanagi, MD;  Location: [facility];  Service: Ophthalmology;  Laterality: Left;  . COLONOSCOPY  2014   benign polyps  . CYSTOSCOPY    . ESOPHAGEAL MANOMETRY N/A 08/03/2016   Procedure: ESOPHAGEAL MANOMETRY (EM);  Surgeon: Mauri Pole, MD;  [facility];  Service: Endoscopy;  Laterality: N/A;  . ESOPHAGOGASTRODUODENOSCOPY (EGD) WITH PROPOFOL N/A 02/17/2016   with esoph dilatation  . TONSILLECTOMY     Family History  Problem Relation Age of Onset  . Hypertension Mother   . Cancer Mother        breast  . Aneurysm Mother   . Healthy Sister   . Prostate cancer Neg Hx   . Kidney cancer Neg Hx   . Bladder Cancer Neg Hx    Social History   Socioeconomic History  .  Marital status: Divorced    Spouse name: Not on file  . Number of children: 1  . Years of education: some college  . Highest education level: 12th grade  Occupational History  . Occupation: Retired  Scientific laboratory technician  . Financial resource strain: Not hard at all  . Food insecurity:    Worry: Never true    Inability: Never true  . Transportation needs:    Medical: No    Non-medical: No  Tobacco Use  . Smoking status: Former Smoker    Packs/day: 0.50    Years: 20.00    Pack years: 10.00    Types: Cigarettes, Pipe, Cigars    Last attempt to quit: 04/05/1986    Years since quitting: 31.9  . Smokeless  tobacco: Never Used  . Tobacco comment: smoking cessation materials not required  Substance and Sexual Activity  . Alcohol use: Yes    Alcohol/week: 8.4 oz    Types: 14 Standard drinks or equivalent per week    Comment: socially  . Drug use: No  . Sexual activity: Not Currently  Lifestyle  . Physical activity:    Days per week: 5 days    Minutes per session: 60 min  . Stress: Not at all  Relationships  . Social connections:    Talks on phone: Patient refused    Gets together: Patient refused    Attends religious service: Patient refused    Active member of club or organization: Patient refused    Attends meetings of clubs or organizations: Patient refused    Relationship status: Divorced  Other Topics Concern  . Not on file  Social History Narrative  . Not on file    Outpatient Encounter Medications as of 03/21/2018  Medication Sig  . azelastine (ASTELIN) 0.1 % nasal spray   . Calcium Carbonate-Vit D-Min (CALCIUM 1200 PO) Take 1 mg by mouth.  . finasteride (PROSCAR) 5 MG tablet Take 5 mg by mouth daily.  . fluticasone (FLONASE) 50 MCG/ACT nasal spray Place 2 sprays into both nostrils daily. Reported on 03/01/2016  . glucosamine-chondroitin 500-400 MG tablet Take 1 tablet by mouth daily.  . magnesium oxide (MAG-OX) 400 MG tablet Take 400 mg by mouth daily.  . naproxen (NAPROSYN) 500 MG tablet Take 500 mg by mouth 2 (two) times daily with a meal.  . omeprazole (PRILOSEC) 40 MG capsule   . Probiotic Product (PROBIOTIC DAILY PO) Take by mouth.  . Saw Palmetto 450 MG CAPS Take by mouth daily.  . tamsulosin (FLOMAX) 0.4 MG CAPS capsule Take 0.4 mg by mouth.  . traZODone (DESYREL) 50 MG tablet Take 50 mg by mouth at bedtime.  . gabapentin (NEURONTIN) 100 MG capsule Take 300 mg by mouth 3 (three) times daily.   . Loperamide HCl (IMODIUM A-D PO) Take by mouth as needed.    No facility-administered encounter medications on file as of 03/21/2018.     Activities of Daily Living In  your present state of health, do you have any difficulty performing the following activities: 03/21/2018  Hearing? N  Comment denies hearing aids; vertigo  Vision? N  Comment denies eyeglasses  Difficulty concentrating or making decisions? Y  Comment short term memory loss  Walking or climbing stairs? Y  Comment joint, back pain and dyspnea  Dressing or bathing? N  Doing errands, shopping? N  Preparing Food and eating ? N  Comment partial upper and lower dentures  Using the Toilet? N  In the past six months, have you  accidently leaked urine? N  Do you have problems with loss of bowel control? N  Managing your Medications? N  Managing your Finances? N  Housekeeping or managing your Housekeeping? N  Some recent data might be hidden    Patient Care Team: Glean Hess, MD as PCP - General (Family Medicine) Wellington Hampshire, MD as Consulting Physician (Cardiology) Manya Silvas, MD (Gastroenterology)   Assessment:   This is a routine wellness examination for Zoe.  Exercise Activities and Dietary recommendations Current Exercise Habits: Home exercise routine, Type of exercise: walking;strength training/weights, Time (Minutes): 60, Frequency (Times/Week): 5, Weekly Exercise (Minutes/Week): 300, Intensity: Mild  Goals    . DIET - INCREASE WATER INTAKE     Recommend to drink at least 6-8 8oz glasses of water per day.    . Increase water intake     To avoid bradycardia and lightheadedness.       Fall Risk Fall Risk  03/21/2018 05/02/2017 02/23/2017 02/15/2017 04/05/2016  Falls in the past year? No No No No No  Risk for fall due to : Medication side effect;Other (Comment) - - - -  Risk for fall due to: Comment vertigo - - - -   FALL RISK PREVENTION PERTAINING TO HOME: Is your home free of loose throw rugs in walkways, pet beds, electrical cords, etc? Yes Is there adequate lighting in your home to reduce risk of falls?  Yes Are there stairs in or around your home WITH  handrails? No stairs  ASSISTIVE DEVICES UTILIZED TO PREVENT FALLS: Use of a cane, walker or w/c? No Grab bars in the bathroom? No  Shower chair or a place to sit while bathing? No An elevated toilet seat or a handicapped toilet? No  Timed Get Up and Go Performed: Yes. Pt ambulated 10 feet within 8 sec. Gait stead-fast and without the use of an assistive device   Community Resource Referral:  Pt declined my offer to send [employer] Referral to Care Guide for installation of grab bars in the shower, shower chair or an elevated toilet seat.  Depression Screen PHQ 2/9 Scores 03/21/2018 02/23/2017 02/15/2017 01/08/2016  PHQ - 2 Score 0 0 1 0  PHQ- 9 Score 0 - - -    Cognitive Function     6CIT Screen 03/21/2018  What Year? 0 points  What month? 0 points  What time? 0 points  Count back from 20 0 points  Months in reverse 0 points  Repeat phrase 0 points  Total Score 0    Immunization History  Administered Date(s) Administered  . Influenza,inj,Quad PF,6+ Mos 07/21/2017  . Influenza-Unspecified 09/06/2015  . Pneumococcal Conjugate-13 05/28/2015  . Pneumococcal Polysaccharide-23 09/06/2010  . Tdap 09/06/2010  . Zoster 09/06/2010    Qualifies for Shingles Vaccine? Yes. Zostavax completed 09/06/10. Due for Shingrix. Education has been provided regarding the importance of this vaccine. Pt has been advised to call insurance company to determine out of pocket expense. Advised may also receive vaccine at local pharmacy or Health Dept. Verbalized acceptance and understanding.  Screening Tests Health Maintenance  Topic Date Due  . Hepatitis C Screening  03/22/2019 (Originally 1946-05-18)  . INFLUENZA VACCINE  04/05/2018  . TETANUS/TDAP  09/06/2020  . COLONOSCOPY  06/01/2023  . PNA vac Low Risk Adult  Completed   Cancer Screenings: Lung: Low Dose CT Chest recommended if Age 60-80 years, 30 pack-year currently smoking OR have quit w/in 15years. Patient does not  qualify. Colorectal: Completed 05/31/13. Repeat every 10  years  Additional Screenings: Hepatitis C Screening: Declined     Plan:  I have personally reviewed and addressed the Medicare Annual Wellness questionnaire and have noted the following in the patient's chart:  A. Medical and social history B. Use of alcohol, tobacco or illicit drugs  C. Current medications and supplements D. Functional ability and status E.  Nutritional status F.  Physical activity G. Advance directives H. List of other physicians I.  Hospitalizations, surgeries, and ER visits in previous 12 months J.  [place] such as hearing and vision if needed, cognitive and depression L. Referrals and appointments  In addition, I have reviewed and discussed with patient certain preventive protocols, quality metrics, and best practice recommendations. A written personalized care plan for preventive services as well as general preventive health recommendations were provided to patient.  Signed,  Aleatha Borer, LPN Nurse Health Advisor  MD Recommendations: Zostavax completed 09/06/10. Due for Shingrix. Education has been provided regarding the importance of this vaccine. Pt has been advised to call insurance company to determine out of pocket expense. Advised may also receive vaccine at local pharmacy or Health Dept. Verbalized acceptance and understanding.

## 2018-03-26 ENCOUNTER — Ambulatory Visit
Admission: RE | Admit: 2018-03-26 | Discharge: 2018-03-26 | Disposition: A | Discharge status: Home / Self Care | Payer: PPO | Source: Ambulatory Visit | Attending: Internal Medicine | Admitting: Internal Medicine

## 2018-03-26 ENCOUNTER — Ambulatory Visit (INDEPENDENT_AMBULATORY_CARE_PROVIDER_SITE_OTHER): Payer: PPO | Admitting: Internal Medicine

## 2018-03-26 ENCOUNTER — Encounter: Payer: Self-pay | Admitting: Internal Medicine

## 2018-03-26 VITALS — BP 110/66 | HR 49 | Ht 71.0 in | Wt 167.0 lb

## 2018-03-26 DIAGNOSIS — R1319 Other dysphagia: Secondary | ICD-10-CM

## 2018-03-26 DIAGNOSIS — F17201 Nicotine dependence, unspecified, in remission: Secondary | ICD-10-CM

## 2018-03-26 DIAGNOSIS — Z0001 Encounter for general adult medical examination with abnormal findings: Secondary | ICD-10-CM | POA: Diagnosis not present

## 2018-03-26 DIAGNOSIS — M5030 Other cervical disc degeneration, unspecified cervical region: Secondary | ICD-10-CM | POA: Diagnosis not present

## 2018-03-26 DIAGNOSIS — J449 Chronic obstructive pulmonary disease, unspecified: Secondary | ICD-10-CM

## 2018-03-26 DIAGNOSIS — Z0000 Encounter for general adult medical examination without abnormal findings: Secondary | ICD-10-CM

## 2018-03-26 DIAGNOSIS — R5383 Other fatigue: Secondary | ICD-10-CM | POA: Diagnosis not present

## 2018-03-26 DIAGNOSIS — I700 Atherosclerosis of aorta: Secondary | ICD-10-CM | POA: Insufficient documentation

## 2018-03-26 DIAGNOSIS — Z23 Encounter for immunization: Secondary | ICD-10-CM

## 2018-03-26 DIAGNOSIS — R0602 Shortness of breath: Secondary | ICD-10-CM | POA: Diagnosis not present

## 2018-03-26 DIAGNOSIS — I495 Sick sinus syndrome: Secondary | ICD-10-CM

## 2018-03-26 LAB — POCT URINALYSIS DIPSTICK
BILIRUBIN UA: NEGATIVE
Blood, UA: NEGATIVE
GLUCOSE UA: NEGATIVE
KETONES UA: NEGATIVE
Leukocytes, UA: NEGATIVE
Nitrite, UA: NEGATIVE
PROTEIN UA: NEGATIVE
Spec Grav, UA: <=1.005 — AB (ref 1.010–1.025)
Urobilinogen, UA: 0.2 E.U./dL
pH, UA: 6 (ref 5.0–8.0)

## 2018-03-26 MED ORDER — ZOSTER VAC RECOMB ADJUVANTED 50 MCG/0.5ML IM SUSR: 0.5000 mL | Freq: Once | INTRAMUSCULAR | 1 refills | Status: AC

## 2018-03-26 NOTE — Progress Notes (Signed)
Date:  03/26/2018   Name:  Timothy Scott   DOB:  1946/03/25   MRN:  [id number]   Chief Complaint: Annual Exam Timothy Scott is a 72 y.o. male who presents today for his Complete Annual Exam. He feels fairly well but has much less energy than usual. He reports exercising walking etc. He reports he is sleeping fairly well. Immunizations are up to date other than Shingrix.  He had colonoscopy in 2015.  He is still having issue with choking sensation in his throat.  He tried nerve injections, had CT/MRI, now doing PT of the neck and gabapentin, all without benefit. He has hx of smoking and COPD.  He wants to see Pulmonary to make sure it is not related to his lungs. 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  He had PSA and testosterone done by [facility] recently that were normal. He admits to being slightly down due to ongoing issues with his throat. His cardiologist says that he might benefit from a pacemaker for SSS but he has so far resisted. He is trying to keep up his nutrition with vitamin and supplements, Ensure, etc.  His weight is down a few pounds.  Review of Systems  Constitutional: Positive for appetite change. Negative for chills, diaphoresis, fatigue and unexpected weight change.  HENT: Positive for trouble swallowing. Negative for hearing loss, tinnitus and voice change.   Eyes: Negative for visual disturbance.  Respiratory: Negative for choking, shortness of breath and wheezing.   Cardiovascular: Negative for chest pain, palpitations and leg swelling.  Gastrointestinal: Negative for abdominal pain, blood in stool, constipation and diarrhea.  Genitourinary: Negative for difficulty urinating, dysuria and frequency.  Musculoskeletal: Positive for arthralgias (in hands). Negative for back pain and myalgias.  Skin: Negative for color change and rash.  Neurological: Negative for dizziness, syncope and headaches.    Hematological: Negative for adenopathy.  Psychiatric/Behavioral: Positive for dysphoric mood. Negative for sleep disturbance. The patient is not nervous/anxious.     Patient Active Problem List   Diagnosis Date Noted  . Sick sinus syndrome ([place]) 03/09/2017  . Vertigo 12/12/2016  . Anosmia 05/05/2016  . Dysphagia 02/03/2016  . Tobacco use disorder, moderate, in sustained remission 03/11/2015  . Lumbar disc herniation with radiculopathy 03/11/2015  . Gonalgia 03/10/2015  . DDD (degenerative disc disease), cervical 03/10/2015  . Difficult or painful urination 03/10/2015  . COPD, mild ([place]) 03/10/2015  . Degenerative arthritis of finger 03/10/2015  . Post-traumatic stress disorder 03/10/2015  . Lattice degeneration 03/10/2015  . Leg varices 03/10/2015  . Sinus bradycardia 04/17/2014    Prior to Admission medications   Medication Sig Start Date End Date Taking? Authorizing Provider  azelastine (ASTELIN) 0.1 % nasal spray  11/18/15  Yes [provider]  Calcium Carbonate-Vit Timothy-Min (CALCIUM 1200 PO) Take 1 mg by mouth.   Yes [provider]  finasteride (PROSCAR) 5 MG tablet Take 5 mg by mouth daily.   Yes [provider]  fluticasone (FLONASE) 50 MCG/ACT nasal spray Place 2 sprays into both nostrils daily. Reported on 03/01/2016   Yes [provider]  glucosamine-chondroitin 500-400 MG tablet Take 1 tablet by mouth daily.   Yes [provider]  Loperamide HCl (IMODIUM A-Timothy PO) Take by mouth as needed.    Yes [provider]  magnesium oxide (MAG-OX) 400 MG tablet Take 400 mg by mouth daily.   Yes [provider]  naproxen (NAPROSYN) 500 MG tablet Take 500 mg by mouth 2 (two) times daily with a meal.   Yes [provider]  omeprazole (PRILOSEC) 40 MG capsule  10/30/17  Yes [provider]  Probiotic Product (PROBIOTIC DAILY PO) Take by mouth.   Yes [provider]  Saw Palmetto 450 MG CAPS Take by  mouth daily.   Yes [provider]  tamsulosin (FLOMAX) 0.4 MG CAPS capsule Take 0.4 mg by mouth.   Yes [provider]  traZODone (DESYREL) 50 MG tablet Take 50 mg by mouth at bedtime.   Yes [provider]  gabapentin (NEURONTIN) 100 MG capsule Take 300 mg by mouth 3 (three) times daily.  02/15/17   [provider]    No Known Allergies  Past Surgical History:  Procedure Laterality Date  . CATARACT EXTRACTION Right 2015  . CATARACT EXTRACTION W/PHACO Left 10/21/2015   Procedure: CATARACT EXTRACTION PHACO AND INTRAOCULAR LENS PLACEMENT (IOC);  Surgeon: Leandrew Koyanagi, MD;  Location: [facility];  Service: Ophthalmology;  Laterality: Left;  . COLONOSCOPY  2014   benign polyps  . CYSTOSCOPY    . ESOPHAGEAL MANOMETRY N/A 08/03/2016   Procedure: ESOPHAGEAL MANOMETRY (EM);  Surgeon: Mauri Pole, MD;  [facility];  Service: Endoscopy;  Laterality: N/A;  . ESOPHAGOGASTRODUODENOSCOPY (EGD) WITH PROPOFOL N/A 02/17/2016   with esoph dilatation  . TONSILLECTOMY      Social History   Tobacco Use  . Smoking status: Former Smoker    Packs/day: 0.50    Years: 20.00    Pack years: 10.00    Types: Cigarettes, Pipe, Cigars    Last attempt to quit: 04/05/1986    Years since quitting: 31.9  . Smokeless tobacco: Never Used  . Tobacco comment: smoking cessation materials not required  Substance Use Topics  . Alcohol use: Yes    Alcohol/week: 8.4 oz    Types: 14 Standard drinks or equivalent per week    Comment: socially  . Drug use: No     Medication list has been reviewed and updated.  Current Meds  Medication Sig  . azelastine (ASTELIN) 0.1 % nasal spray   . Calcium Carbonate-Vit Timothy-Min (CALCIUM 1200 PO) Take 1 mg by mouth.  . finasteride (PROSCAR) 5 MG tablet Take 5 mg by mouth daily.  . fluticasone (FLONASE) 50 MCG/ACT nasal spray Place 2 sprays into both nostrils daily. Reported on 03/01/2016  . glucosamine-chondroitin  500-400 MG tablet Take 1 tablet by mouth daily.  . Loperamide HCl (IMODIUM A-Timothy PO) Take by mouth as needed.   . magnesium oxide (MAG-OX) 400 MG tablet Take 400 mg by mouth daily.  . naproxen (NAPROSYN) 500 MG tablet Take 500 mg by mouth 2 (two) times daily with a meal.  . omeprazole (PRILOSEC) 40 MG capsule   . Probiotic Product (PROBIOTIC DAILY PO) Take by mouth.  . Saw Palmetto 450 MG CAPS Take by mouth daily.  . tamsulosin (FLOMAX) 0.4 MG CAPS capsule Take 0.4 mg by mouth.  . traZODone (DESYREL) 50 MG tablet Take 50 mg by mouth at bedtime.    PHQ 2/9 Scores 03/21/2018 02/23/2017 02/15/2017 01/08/2016  PHQ - 2 Score 0 0 1 0  PHQ- 9 Score 0 - - -    Physical Exam  Constitutional: He is oriented to person, place, and time. He appears well-developed and well-nourished.  HENT:  Head: Normocephalic.  Right Ear: Tympanic membrane, external ear and ear canal normal.  Left Ear: Tympanic  membrane, external ear and ear canal normal.  Nose: Nose normal.  Mouth/Throat: Uvula is midline and oropharynx is clear and moist. No posterior oropharyngeal edema or posterior oropharyngeal erythema.  Eyes: Pupils are equal, round, and reactive to light. Conjunctivae and EOM are normal.  Neck: Normal range of motion. Neck supple. Tracheal tenderness and muscular tenderness (without mass) present. Carotid bruit is not present. No neck rigidity. No edema and no erythema present. No thyromegaly present.  Cardiovascular: Normal rate, regular rhythm, normal heart sounds and intact distal pulses.  Pulmonary/Chest: Effort normal and breath sounds normal. He has no wheezes. He has no rhonchi. Right breast exhibits no mass. Left breast exhibits no mass.  Abdominal: Soft. Normal appearance and bowel sounds are normal. There is no hepatosplenomegaly. There is no tenderness.  Musculoskeletal: Normal range of motion.  Lymphadenopathy:    He has no cervical adenopathy.  Neurological: He is alert and oriented to person,  place, and time. He has normal strength and normal reflexes.  Skin: Skin is warm, dry and intact.  Psychiatric: He has a normal mood and affect. His speech is normal and behavior is normal. Judgment and thought content normal. Cognition and memory are normal.  Nursing note and vitals reviewed.   BP 110/66   Pulse (!) 49   Ht 5\' 11"  (1.803 m)   Wt 167 lb (75.8 kg)   SpO2 97%   BMI 23.29 kg/m   Assessment and Plan: 1. Annual physical exam Normal exam Up to date screenings  2. Need for shingles vaccine - Zoster Vaccine Adjuvanted [facility]) injection; Inject 0.5 mLs into the muscle once for 1 dose.  Dispense: 0.5 mL; Refill: 1  3. COPD, mild ([place]) Will get formal CXR and refer to Pulmonary - DG Chest 2 View; Future - Ambulatory referral to Pulmonology  4. Sick sinus syndrome [facility]) Consider pacemaker - Comprehensive metabolic panel - Lipid panel - TSH  5. Other dysphagia Persistent despite multiple therapies Will try Lyrica - samples given - titrate up to 50 mg bid Consider Cymbalta if lyrica no benefit  6. DDD (degenerative disc disease), cervical Continue PT  7. Tobacco use disorder, moderate, in sustained remission  8. Fatigue, unspecified type Recent testosterone normal by [facility] Discussed considering pacemaker Checking TSH   Meds ordered this encounter  Medications  . Zoster Vaccine Adjuvanted [facility]) injection    Sig: Inject 0.5 mLs into the muscle once for 1 dose.    Dispense:  0.5 mL    Refill:  1    Partially dictated using Editor, commissioning. Any errors are unintentional.  Halina Maidens, MD [facility]  03/26/2018

## 2018-03-27 DIAGNOSIS — R293 Abnormal posture: Secondary | ICD-10-CM | POA: Diagnosis not present

## 2018-03-27 DIAGNOSIS — M5412 Radiculopathy, cervical region: Secondary | ICD-10-CM | POA: Diagnosis not present

## 2018-03-27 LAB — LIPID PANEL
CHOLESTEROL TOTAL: 160 mg/dL (ref 100–199)
Chol/HDL Ratio: 2.5 ratio (ref 0.0–5.0)
HDL: 64 mg/dL (ref >39)
LDL CALC: 84 mg/dL (ref 0–99)
Triglycerides: 59 mg/dL (ref 0–149)
VLDL Cholesterol Cal: 12 mg/dL (ref 5–40)

## 2018-03-27 LAB — COMPREHENSIVE METABOLIC PANEL
ALK PHOS: 47 IU/L (ref 39–117)
ALT: 16 IU/L (ref 0–44)
AST: 17 IU/L (ref 0–40)
Albumin/Globulin Ratio: 2.4 — ABNORMAL HIGH (ref 1.2–2.2)
Albumin: 4.6 g/dL (ref 3.5–4.8)
BILIRUBIN TOTAL: 0.5 mg/dL (ref 0.0–1.2)
BUN / CREAT RATIO: 12 (ref 10–24)
BUN: 10 mg/dL (ref 8–27)
CO2: 25 mmol/L (ref 20–29)
CREATININE: 0.86 mg/dL (ref 0.76–1.27)
Calcium: 9 mg/dL (ref 8.6–10.2)
Chloride: 103 mmol/L (ref 96–106)
GFR calc non Af Amer: 87 mL/min/{1.73_m2} (ref >59)
GFR, EST AFRICAN AMERICAN: 100 mL/min/{1.73_m2} (ref >59)
GLOBULIN, TOTAL: 1.9 g/dL (ref 1.5–4.5)
Glucose: 86 mg/dL (ref 65–99)
Potassium: 4.6 mmol/L (ref 3.5–5.2)
SODIUM: 143 mmol/L (ref 134–144)
TOTAL PROTEIN: 6.5 g/dL (ref 6.0–8.5)

## 2018-03-27 LAB — TSH: TSH: 2.74 u[IU]/mL (ref 0.450–4.500)

## 2018-03-29 DIAGNOSIS — M5412 Radiculopathy, cervical region: Secondary | ICD-10-CM | POA: Diagnosis not present

## 2018-04-24 ENCOUNTER — Encounter: Payer: Self-pay | Admitting: Pulmonary Disease

## 2018-04-24 ENCOUNTER — Ambulatory Visit: Payer: PPO | Admitting: Pulmonary Disease

## 2018-04-24 ENCOUNTER — Encounter

## 2018-04-24 VITALS — BP 118/80 | HR 62 | Ht 71.0 in | Wt 169.0 lb

## 2018-04-24 DIAGNOSIS — F458 Other somatoform disorders: Secondary | ICD-10-CM

## 2018-04-24 DIAGNOSIS — Z87891 Personal history of nicotine dependence: Secondary | ICD-10-CM | POA: Diagnosis not present

## 2018-04-24 DIAGNOSIS — J439 Emphysema, unspecified: Secondary | ICD-10-CM

## 2018-04-24 DIAGNOSIS — K219 Gastro-esophageal reflux disease without esophagitis: Secondary | ICD-10-CM | POA: Diagnosis not present

## 2018-04-24 DIAGNOSIS — R0989 Other specified symptoms and signs involving the circulatory and respiratory systems: Secondary | ICD-10-CM

## 2018-04-24 MED ORDER — FAMOTIDINE 40 MG PO TABS: 40.0000 mg | ORAL_TABLET | Freq: Every evening | ORAL | 5 refills | Status: DC

## 2018-04-24 NOTE — Progress Notes (Signed)
PULMONARY CONSULT NOTE  Requesting MD/Service: Timothy Scott Date of initial consultation: 04/24/18 Reason for consultation: Throat tightness, COPD  PT PROFILE: 72 y.o. male former smoker (20-pack-year history, quit 1987) with chronic dysphagia and history of COPD  DATA: 06/29/17 stress echo: Left ventricular function normal.  No evidence of pulmonary hypertension.    INTERVAL:  HPI:  His chief complaint is a sense of throat tightness which has been present for the past 2 years or more.  This has become very troubling to him.  He describes throat tightness which is present all the time but comes and goes in severity.  He does describe a globus sensation and occasional gagging as a result of this sensation in his throat.  He is undergone extensive evaluation by ENT, orthopedic surgery, neurology and gastroenterology without a definitive diagnosis rendered.  Testing has included laryngoscopy, barium swallow, CT scan of C-spine, MRI of C-spine.  Although the abnormalities have been identified, and none of them seem to explain his symptoms.  He has not been able to identify any factors that make his symptoms worse.  He states that sometimes this sensation of throat tightness/fullness is improved after eating.  He has lost approximately 5 pounds in the past 2 years.  Otherwise, he has not been able to identify any mitigating factors.  He has never undergone a true treatment regimen for possible LPR.  He is a former smoker as documented above.  He does have a prior chest x-ray suggesting emphysema.  He denies significant exertional dyspnea.  He believes his exercise tolerance is normal for his age.  There is no day-to-day variation in his respiratory symptoms.  He denies chest pain, fever, hemoptysis, cough, purulent sputum, orthopnea, paroxysmal nocturnal dyspnea, lower extremity edema.  He has been tried on inhaled bronchodilators in the past which he did not find to be beneficial which caused  "jitteriness".  Past Medical History:  Diagnosis Date  . Benign neoplasm of colon   . Chronic airway obstruction, not elsewhere classified   . COPD (chronic obstructive pulmonary disease) ([place])   . DDD (degenerative disc disease), cervical   . Degeneration of cervical intervertebral disc   . Dysphagia   . Dysuria   . 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 Lattice degeneration of peripheral retina   . Leg varices 03/10/2015  . Osteoarthrosis, unspecified whether generalized or localized, hand   . Posttraumatic stress disorder   . Sinus bradycardia 04/17/2014  . Varicose veins of bilateral lower extremities with other complications   . Wears dentures    partial upper and lower    Past Surgical History:  Procedure Laterality Date  . CATARACT EXTRACTION Right 2015  . CATARACT EXTRACTION W/PHACO Left 10/21/2015   Procedure: CATARACT EXTRACTION PHACO AND INTRAOCULAR LENS PLACEMENT (IOC);  Surgeon: Leandrew Koyanagi, MD;  Location: [place];  Service: Ophthalmology;  Laterality: Left;  . COLONOSCOPY  2014   benign polyps  . CYSTOSCOPY    . ESOPHAGEAL MANOMETRY N/A 08/03/2016   Procedure: ESOPHAGEAL MANOMETRY (EM);  Surgeon: Mauri Pole, MD;  Location: [facility];  Service: Endoscopy;  Laterality: N/A;  . ESOPHAGOGASTRODUODENOSCOPY (EGD) WITH PROPOFOL N/A 02/17/2016   with esoph dilatation  . TONSILLECTOMY      MEDICATIONS: I have reviewed all medications and confirmed regimen as documented  Social History   Socioeconomic History  . Marital status: Divorced    Spouse name: Not on file  . Number of children: 1  . Years of  education: some college  . Highest education level: 12th grade  Occupational History  . Occupation: Retired  Scientific laboratory technician  . Financial resource strain: Not hard at all  . Food insecurity:    Worry: Never true    Inability: Never true  . Transportation needs:    Medical: No    Non-medical: No  Tobacco Use  .  Smoking status: Former Smoker    Packs/day: 0.50    Years: 20.00    Pack years: 10.00    Types: Cigarettes, Pipe, Cigars    Last attempt to quit: 04/05/1986    Years since quitting: 32.0  . Smokeless tobacco: Never Used  . Tobacco comment: smoking cessation materials not required  Substance and Sexual Activity  . Alcohol use: Yes    Alcohol/week: 14.0 standard drinks    Types: 14 Standard drinks or equivalent per week    Comment: socially  . Drug use: No  . Sexual activity: Not Currently  Lifestyle  . Physical activity:    Days per week: 5 days    Minutes per session: 60 min  . Stress: Not at all  Relationships  . Social connections:    Talks on phone: Patient refused    Gets together: Patient refused    Attends religious service: Patient refused    Active member of club or organization: Patient refused    Attends meetings of clubs or organizations: Patient refused    Relationship status: Divorced  . Intimate partner violence:    Fear of current or ex partner: No    Emotionally abused: No    Physically abused: No    Forced sexual activity: No  Other Topics Concern  . Not on file  Social History Narrative  . Not on file    Family History  Problem Relation Age of Onset  . Hypertension Mother   . Cancer Mother        breast  . Aneurysm Mother   . Healthy Sister   . Prostate cancer Neg Hx   . Kidney cancer Neg Hx   . Bladder Cancer Neg Hx     ROS: No fever, myalgias/arthralgias, unexplained weight loss or weight gain No new focal weakness or sensory deficits No otalgia, hearing loss, visual changes, nasal and sinus symptoms, mouth and throat problems No neck pain or adenopathy No abdominal pain, N/V/D, diarrhea, change in bowel pattern No dysuria, change in urinary pattern   Vitals:   04/24/18 0850 04/24/18 0853  BP:  118/80  Pulse:  62  SpO2:  96%  Weight: 169 lb (76.7 kg)   Height: 5\' 11"  (1.803 m)      EXAM:   Gen: WDWN in NAD HEENT: NCAT,  sclerae white, oropharynx normal Neck: No LAN, no JVD noted Lungs: full BS, normal percussion note, no wheezes or other adventitious sounds Cardiovascular: Regular, normal rate, no M noted Abdomen: Soft, NT, +BS Ext: no C/C/E Neuro: PERRL, EOMI, motor/sensory grossly intact Skin: No lesions noted   DATA:   BMP Latest Ref Rng & Units 03/26/2018 12/13/2017 10/31/2017  Glucose 65 - 99 mg/dL 86 - -  BUN 8 - 27 mg/dL 10 13 -  Creatinine 0.76 - 1.27 mg/dL 0.86 0.8 0.90  BUN/Creat Ratio 10 - 24 12 - -  Sodium 134 - 144 mmol/L 143 144 -  Potassium 3.5 - 5.2 mmol/L 4.6 4.8 -  Chloride 96 - 106 mmol/L 103 - -  CO2 20 - 29 mmol/L 25 - -  Calcium 8.6 -  10.2 mg/dL 9.0 - -    CBC Latest Ref Rng & Units 02/23/2017 04/05/2016  WBC 3.4 - 10.8 x10E3/uL 6.4 7.3  Hemoglobin 13.0 - 17.7 g/dL 13.7 14.3  Hematocrit 37.5 - 51.0 % 40.8 42.0  Platelets 150 - 379 x10E3/uL 282 297    CXR 7/22: Mild hyperinflation, no acute findings  I have personally reviewed all chest radiographs reported above including CXRs and CT chest unless otherwise indicated  IMPRESSION:     ICD-10-CM   1. Persistent severe globus sensation F45.8   2. Former smoker Z87.891 Pulmonary Function Test [facility] Only  3. Suspect mild emphysema based on CXR J43.9 Pulmonary Function Test [facility] Only  4. Suspect laryngopharyngeal reflux (LPR) K21.9    His throat symptoms are likely entirely unrelated to (probably mild) emphysema.  Most likely explanations for his symptoms are atypical presentations of LPR or VCD  PLAN:  I have instructed him to use of omeprazole (Prilosec) 40 mg daily every day until follow-up New prescription: Famotidine (Pepcid) 40 mg daily at bedtime PFTs have been ordered to quantify severity of emphysema.  These might also suggest or identify VCD  Follow-up in 6 to 8 weeks.  Call sooner as needed   Merton Border, MD PCCM service Mobile [phone] Pager [phone] 04/24/2018 9:30 AM

## 2018-04-24 NOTE — Patient Instructions (Signed)
Use of omeprazole (Prilosec) 40 mg daily every day until follow-up Famotidine (Pepcid) 40 mg daily at bedtime Lung function tests have been ordered Follow-up in 6 to 8 weeks.  Call sooner as needed

## 2018-05-04 ENCOUNTER — Ambulatory Visit: Payer: PPO | Admitting: Internal Medicine

## 2018-05-23 ENCOUNTER — Encounter: Payer: Self-pay | Admitting: Internal Medicine

## 2018-05-23 ENCOUNTER — Ambulatory Visit (INDEPENDENT_AMBULATORY_CARE_PROVIDER_SITE_OTHER): Payer: PPO | Admitting: Internal Medicine

## 2018-05-23 VITALS — BP 112/66 | HR 58 | Ht 71.0 in | Wt 167.0 lb

## 2018-05-23 DIAGNOSIS — R1319 Other dysphagia: Secondary | ICD-10-CM | POA: Diagnosis not present

## 2018-05-23 MED ORDER — GABAPENTIN 100 MG PO CAPS: 100.0000 mg | ORAL_CAPSULE | Freq: Two times a day (BID) | ORAL | 0 refills | Status: DC

## 2018-05-23 NOTE — Progress Notes (Signed)
Date:  05/23/2018   Name:  Timothy Scott   DOB:  Jul 31, 1946   MRN:  [id number]   Chief Complaint: Choking (Throat swelling, tightness and choking is getting worse. Eating okay. Does not affect swallowing when eating. Can be talking and gags. )  He is here to follow up from his swallowing issue.  He continues to gag and cough at times.  He tried Lyrica but he had severe constipation.  He also tried gabapentin 600 mg per day but stopped after about 3 months due to lack of benefit. He wants to try gabapentin again to make sure it did not help.  Review of Systems  Constitutional: Positive for unexpected weight change. Negative for chills, diaphoresis and fatigue.  HENT: Negative for sore throat and trouble swallowing.        Globus sensation  Respiratory: Positive for shortness of breath. Negative for cough, chest tightness and wheezing.   Cardiovascular: Negative for chest pain and palpitations.  Allergic/Immunologic: Negative for environmental allergies.  Neurological: Negative for dizziness and headaches.    Patient Active Problem List   Diagnosis Date Noted  . Sick sinus syndrome ([place]) 03/09/2017  . Vertigo 12/12/2016  . Anosmia 05/05/2016  . Dysphagia 02/03/2016  . Tobacco use disorder, moderate, in sustained remission 03/11/2015  . Lumbar disc herniation with radiculopathy 03/11/2015  . Gonalgia 03/10/2015  . DDD (degenerative disc disease), cervical 03/10/2015  . Difficult or painful urination 03/10/2015  . COPD, mild ([place]) 03/10/2015  . Degenerative arthritis of finger 03/10/2015  . Post-traumatic stress disorder 03/10/2015  . Lattice degeneration 03/10/2015  . Leg varices 03/10/2015  . Sinus bradycardia 04/17/2014    No Known Allergies  Past Surgical History:  Procedure Laterality Date  . CATARACT EXTRACTION Right 2015  . CATARACT EXTRACTION W/PHACO Left 10/21/2015   Procedure: CATARACT EXTRACTION PHACO AND INTRAOCULAR LENS PLACEMENT (IOC);  Surgeon: Leandrew Koyanagi, MD;  Location: [place];  Service: Ophthalmology;  Laterality: Left;  . COLONOSCOPY  2014   benign polyps  . CYSTOSCOPY    . ESOPHAGEAL MANOMETRY N/A 08/03/2016   Procedure: ESOPHAGEAL MANOMETRY (EM);  Surgeon: Mauri Pole, MD;  Location: [facility];  Service: Endoscopy;  Laterality: N/A;  . ESOPHAGOGASTRODUODENOSCOPY (EGD) WITH PROPOFOL N/A 02/17/2016   with esoph dilatation  . TONSILLECTOMY      Social History   Tobacco Use  . Smoking status: Former Smoker    Packs/day: 0.50    Years: 20.00    Pack years: 10.00    Types: Cigarettes, Pipe, Cigars    Last attempt to quit: 04/05/1986    Years since quitting: 32.1  . Smokeless tobacco: Never Used  . Tobacco comment: smoking cessation materials not required  Substance Use Topics  . Alcohol use: Yes    Alcohol/week: 14.0 standard drinks    Types: 14 Standard drinks or equivalent per week    Comment: socially  . Drug use: No     Medication list has been reviewed and updated.  Current Meds  Medication Sig  . azelastine (ASTELIN) 0.1 % nasal spray   . Calcium Carbonate-Vit D-Min (CALCIUM 1200 PO) Take 1 mg by mouth.  . famotidine (PEPCID) 40 MG tablet Take 1 tablet (40 mg total) by mouth every evening.  . finasteride (PROSCAR) 5 MG tablet Take 5 mg by mouth daily.  . fluticasone (FLONASE) 50 MCG/ACT nasal spray Place 2 sprays into both nostrils daily. Reported on 03/01/2016  . glucosamine-chondroitin 500-400 MG tablet Take 1 tablet  by mouth daily.  . Loperamide HCl (IMODIUM A-D PO) Take by mouth as needed.   . magnesium oxide (MAG-OX) 400 MG tablet Take 400 mg by mouth daily.  . naproxen (NAPROSYN) 500 MG tablet Take 500 mg by mouth 2 (two) times daily with a meal.  . omeprazole (PRILOSEC) 10 MG capsule Take 10 mg by mouth daily.  . Probiotic Product (PROBIOTIC DAILY PO) Take by mouth.  . Saw Palmetto 450 MG CAPS Take by mouth daily.  . tamsulosin (FLOMAX) 0.4 MG CAPS capsule Take 0.4 mg by mouth.    . traZODone (DESYREL) 50 MG tablet Take 50 mg by mouth at bedtime.    PHQ 2/9 Scores 03/21/2018 02/23/2017 02/15/2017 01/08/2016  PHQ - 2 Score 0 0 1 0  PHQ- 9 Score 0 - - -    Physical Exam  Constitutional: He is oriented to person, place, and time. He appears well-developed. No distress.  HENT:  Head: Normocephalic and atraumatic.  Neck: No tracheal deviation present. No thyromegaly present.  Cardiovascular: Normal rate, regular rhythm and normal heart sounds.  Pulmonary/Chest: Effort normal and breath sounds normal. No respiratory distress.  Musculoskeletal: Normal range of motion.  Lymphadenopathy:    He has no cervical adenopathy.  Neurological: He is alert and oriented to person, place, and time. He has normal strength.  Skin: Skin is warm and dry. No rash noted.  Psychiatric: He has a normal mood and affect. His behavior is normal. Thought content normal.  Nursing note and vitals reviewed.   BP 112/66 (BP Location: Right Arm, Patient Position: Sitting, Cuff Size: Normal)   Pulse (!) 58   Ht 5\' 11"  (1.803 m)   Wt 167 lb (75.8 kg)   SpO2 98%   BMI 23.29 kg/m   Assessment and Plan: 1. Other dysphagia Start at 100 mg and titrate up to 200 mg bid Call to report benefit or not after at least 6 weeks and up titration of medication - gabapentin (NEURONTIN) 100 MG capsule; Take 1-3 capsules (100-300 mg total) by mouth 2 (two) times daily.  Dispense: 120 capsule; Refill: 0   Partially dictated using Editor, commissioning. Any errors are unintentional.  Halina Maidens, MD [facility]  05/23/2018

## 2018-05-29 ENCOUNTER — Ambulatory Visit: Payer: PPO | Attending: Pulmonary Disease

## 2018-05-29 DIAGNOSIS — Z87891 Personal history of nicotine dependence: Secondary | ICD-10-CM | POA: Diagnosis not present

## 2018-05-29 DIAGNOSIS — J439 Emphysema, unspecified: Secondary | ICD-10-CM | POA: Diagnosis not present

## 2018-06-27 ENCOUNTER — Other Ambulatory Visit: Payer: Self-pay | Admitting: Physician Assistant

## 2018-06-27 DIAGNOSIS — K219 Gastro-esophageal reflux disease without esophagitis: Secondary | ICD-10-CM | POA: Diagnosis not present

## 2018-06-27 DIAGNOSIS — R1310 Dysphagia, unspecified: Secondary | ICD-10-CM

## 2018-06-27 DIAGNOSIS — J312 Chronic pharyngitis: Secondary | ICD-10-CM | POA: Diagnosis not present

## 2018-07-04 ENCOUNTER — Ambulatory Visit
Admission: RE | Admit: 2018-07-04 | Discharge: 2018-07-04 | Disposition: A | Discharge status: Home / Self Care | Payer: PPO | Source: Ambulatory Visit | Attending: Physician Assistant | Admitting: Physician Assistant

## 2018-07-04 DIAGNOSIS — R1310 Dysphagia, unspecified: Secondary | ICD-10-CM | POA: Diagnosis not present

## 2018-07-04 LAB — POCT I-STAT CREATININE: Creatinine, Ser: 0.9 mg/dL (ref 0.61–1.24)

## 2018-07-04 MED ORDER — IOPAMIDOL (ISOVUE-300) INJECTION 61%
75.0000 mL | Freq: Once | INTRAVENOUS | Status: AC | PRN
  Administered 2018-07-04: 75 mL via INTRAVENOUS

## 2018-07-09 ENCOUNTER — Encounter: Payer: Self-pay | Admitting: Internal Medicine

## 2018-07-09 DIAGNOSIS — I6522 Occlusion and stenosis of left carotid artery: Secondary | ICD-10-CM | POA: Insufficient documentation

## 2018-07-10 ENCOUNTER — Ambulatory Visit: Payer: PPO | Admitting: Pulmonary Disease

## 2018-07-10 ENCOUNTER — Encounter: Payer: Self-pay | Admitting: Pulmonary Disease

## 2018-07-10 VITALS — BP 112/66 | HR 67 | Resp 16 | Ht 71.0 in | Wt 167.0 lb

## 2018-07-10 DIAGNOSIS — R0989 Other specified symptoms and signs involving the circulatory and respiratory systems: Secondary | ICD-10-CM

## 2018-07-10 NOTE — Patient Instructions (Signed)
No further pulmonary evaluation or treatment  Follow-up as needed for any breathing or chest problems

## 2018-07-10 NOTE — Progress Notes (Signed)
PULMONARY OFFICE FOLLOW-UP NOTE  Requesting MD/Service: Army Melia Date of initial consultation: 04/24/18 Reason for consultation: Throat tightness, COPD  PT PROFILE: 72 y.o. male former smoker (20-pack-year history, quit 1987) with chronic dysphagia and history of COPD  DATA: 06/29/17 stress echo: Left ventricular function normal.  No evidence of pulmonary hypertension.   05/29/18 PFTs: FVC: 3.94 L (89 %pred), FEV1: 2.72 L (82 %pred), FEV1/FVC: 69%, TLC: 5.55 L (75 %pred), DLCO 77 %pred    INTERVAL: No major pulmonary events  Subjective:  He returns today to review results of PFTs.  He continues to have frequent episodes of "choking" and a persistent globus sensation in his neck.  He denies any respiratory symptoms including exertional dyspnea, cough, chest pain, sputum production, hemoptysis.  He has no lower extremity edema or calf tenderness.   Vitals:   07/10/18 0944 07/10/18 0949  BP:  112/66  Pulse:  67  Resp: 16   SpO2:  98%  Weight: 167 lb (75.8 kg)   Height: 5\' 11"  (1.803 m)      EXAM:  Gen: NAD HEENT: NCAT, sclera white Neck: No JVD Lungs: breath sounds full, no wheezes or other adventitious sounds Cardiovascular: RRR, no murmurs Abdomen: Soft, nontender, normal BS Ext: without clubbing, cyanosis, edema Neuro: grossly intact Skin: Limited exam, no lesions noted    DATA:   BMP Latest Ref Rng & Units 07/04/2018 03/26/2018 12/13/2017  Glucose 65 - 99 mg/dL - 86 -  BUN 8 - 27 mg/dL - 10 13  Creatinine 0.61 - 1.24 mg/dL 0.90 0.86 0.8  BUN/Creat Ratio 10 - 24 - 12 -  Sodium 134 - 144 mmol/L - 143 144  Potassium 3.5 - 5.2 mmol/L - 4.6 4.8  Chloride 96 - 106 mmol/L - 103 -  CO2 20 - 29 mmol/L - 25 -  Calcium 8.6 - 10.2 mg/dL - 9.0 -    CBC Latest Ref Rng & Units 02/23/2017 04/05/2016  WBC 3.4 - 10.8 x10E3/uL 6.4 7.3  Hemoglobin 13.0 - 17.7 g/dL 13.7 14.3  Hematocrit 37.5 - 51.0 % 40.8 42.0  Platelets 150 - 379 x10E3/uL 282 297    CXR: No new film  I have  personally reviewed all chest radiographs reported above including CXRs and CT chest unless otherwise indicated  IMPRESSION:     ICD-10-CM   1. Globus sensation R09.89    Etiology of his symptoms are unclear.  His symptoms do not seem to have a pulmonary explanation.  PLAN:  No further pulmonary evaluation or treatment is planned Follow-up as needed for any breathing or chest problems   Merton Border, MD PCCM service Mobile [phone] Pager [phone] 07/10/2018 3:06 PM

## 2018-07-23 ENCOUNTER — Encounter: Payer: Self-pay | Admitting: Urology

## 2018-07-23 ENCOUNTER — Ambulatory Visit: Payer: PPO | Admitting: Urology

## 2018-07-23 VITALS — BP 131/71 | HR 63 | Ht 71.0 in | Wt 165.5 lb

## 2018-07-23 DIAGNOSIS — N400 Enlarged prostate without lower urinary tract symptoms: Secondary | ICD-10-CM | POA: Diagnosis not present

## 2018-07-23 DIAGNOSIS — R300 Dysuria: Secondary | ICD-10-CM | POA: Diagnosis not present

## 2018-07-23 DIAGNOSIS — Z87448 Personal history of other diseases of urinary system: Secondary | ICD-10-CM

## 2018-07-23 LAB — URINALYSIS, COMPLETE
BILIRUBIN UA: NEGATIVE
Glucose, UA: NEGATIVE
Ketones, UA: NEGATIVE
NITRITE UA: NEGATIVE
PH UA: 7 (ref 5.0–7.5)
Protein, UA: NEGATIVE
Specific Gravity, UA: 1.02 (ref 1.005–1.030)
Urobilinogen, Ur: 0.2 mg/dL (ref 0.2–1.0)

## 2018-07-23 LAB — BLADDER SCAN AMB NON-IMAGING: Scan Result: 97

## 2018-07-23 LAB — MICROSCOPIC EXAMINATION

## 2018-07-23 MED ORDER — SULFAMETHOXAZOLE-TRIMETHOPRIM 800-160 MG PO TABS: 1.0000 | ORAL_TABLET | Freq: Two times a day (BID) | ORAL | 0 refills | Status: DC

## 2018-07-23 NOTE — Progress Notes (Signed)
07/23/2018 3:14 PM   Timothy Scott Dec 23, 1945 [id number]  Referring provider: Glean Hess, [place] [place], [place] [address]  No chief complaint on file.   HPI: Patient is 72 year old Caucasian male with a history of hematuria and BPH with LUTS who presents today complaining of frequency and dysuria.  History of hematuria Former smoker.  CTU in 10/2017 revealed no acute findings within the abdomen or pelvis.  No urinary tract calculi identified.  Mild prostate gland enlargement with mild circumferential wall thickening of the urinary bladder.  Liver cysts  Aortic Atherosclerosis.  Cysto in 11/2017 revealed a large prostate visually obstructive approximately 5 cm in length and mild trabeculation with small saccules in the posterior wall and intravesicular lobe.  He does not report any gross hematuria.  His UA today is negative for microscopic hematuria.  He has a remote history of nephrolithiasis.    BPH WITH LUTS  (prostate and/or bladder) Major complaint(s): Frequency x 8-10 for the last couple of months, strong urgency for the last couple of months, dysuria at the tip of penis that is intermittent for the last month not associated with urination, nocturia x 3-4 for the last two months and intermittency for the last couple of months.  Denies any dysuria, hematuria or suprapubic pain.  His PVR is 97 mL.  His UA with moderate bacteria.    Currently taking: tamsulosin 0.4 mg daily and finasteride 5 mg daily.    Denies any recent fevers, chills, nausea or vomiting.  He does not have a family history of PCa.  PSA was 0.2 in June 2018.     PMH: Past Medical History:  Diagnosis Date  . Benign neoplasm of colon   . Chronic airway obstruction, not elsewhere classified   . DDD (degenerative disc disease), cervical   . Degeneration of cervical intervertebral disc   . Dysphagia   . Dysuria   . 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  Lattice degeneration of peripheral retina   . Leg varices 03/10/2015  . Osteoarthrosis, unspecified whether generalized or localized, hand   . Posttraumatic stress disorder   . Sinus bradycardia 04/17/2014  . Varicose veins of bilateral lower extremities with other complications   . Wears dentures    partial upper and lower    Surgical History: Past Surgical History:  Procedure Laterality Date  . CATARACT EXTRACTION Right 2015  . CATARACT EXTRACTION W/PHACO Left 10/21/2015   Procedure: CATARACT EXTRACTION PHACO AND INTRAOCULAR LENS PLACEMENT (IOC);  Surgeon: Leandrew Koyanagi, MD;  Location: [facility];  Service: Ophthalmology;  Laterality: Left;  . COLONOSCOPY  2014   benign polyps  . CYSTOSCOPY    . ESOPHAGEAL MANOMETRY N/A 08/03/2016   Procedure: ESOPHAGEAL MANOMETRY (EM);  Surgeon: Mauri Pole, MD;  Location: [facility];  Service: Endoscopy;  Laterality: N/A;  . ESOPHAGOGASTRODUODENOSCOPY (EGD) WITH PROPOFOL N/A 02/17/2016   with esoph dilatation  . TONSILLECTOMY      Home Medications:  Allergies as of 07/23/2018   No Known Allergies     Medication List        Accurate as of 07/23/18  3:14 PM. Always use your most recent med list.          azelastine 0.1 % nasal spray Commonly known as:  ASTELIN   CALCIUM 1200 PO Take 1 mg by mouth.   famotidine 40 MG tablet Commonly known as:  PEPCID Take 1 tablet (40 mg total) by  mouth every evening.   finasteride 5 MG tablet Commonly known as:  PROSCAR Take 5 mg by mouth daily.   fluticasone 50 MCG/ACT nasal spray Commonly known as:  FLONASE Place 2 sprays into both nostrils daily. Reported on 03/01/2016   gabapentin 100 MG capsule Commonly known as:  NEURONTIN Take 1-3 capsules (100-300 mg total) by mouth 2 (two) times daily.   glucosamine-chondroitin 500-400 MG tablet Take 1 tablet by mouth daily.   IMODIUM A-D PO Take by mouth as needed.   magnesium oxide 400 MG tablet Commonly known as:   MAG-OX Take 400 mg by mouth daily.   naproxen 500 MG tablet Commonly known as:  NAPROSYN Take 500 mg by mouth 2 (two) times daily with a meal.   omeprazole 10 MG capsule Commonly known as:  PRILOSEC Take 10 mg by mouth daily.   PROBIOTIC DAILY PO Take by mouth.   Saw Palmetto 450 MG Caps Take by mouth daily.   sulfamethoxazole-trimethoprim 800-160 MG tablet Commonly known as:  BACTRIM DS,SEPTRA DS Take 1 tablet by mouth every 12 (twelve) hours.   tamsulosin 0.4 MG Caps capsule Commonly known as:  FLOMAX Take 0.4 mg by mouth.   traZODone 50 MG tablet Commonly known as:  DESYREL Take 50 mg by mouth at bedtime.       Allergies: No Known Allergies  Family History: Family History  Problem Relation Age of Onset  . Hypertension Mother   . Cancer Mother        breast  . Aneurysm Mother   . Healthy Sister   . Prostate cancer Neg Hx   . Kidney cancer Neg Hx   . Bladder Cancer Neg Hx     Social History:  reports that he quit smoking about 32 years ago. His smoking use included cigarettes, pipe, and cigars. He has a 10.00 pack-year smoking history. He has never used smokeless tobacco. He reports that he drinks about 14.0 standard drinks of alcohol per week. He reports that he does not use drugs.  ROS: UROLOGY Frequent Urination?: Yes Hard to postpone urination?: Yes Burning/pain with urination?: Yes Get up at night to urinate?: Yes Leakage of urine?: Yes Urine stream starts and stops?: Yes Trouble starting stream?: No Do you have to strain to urinate?: No Blood in urine?: No Urinary tract infection?: No Sexually transmitted disease?: No Injury to kidneys or bladder?: No Painful intercourse?: No Weak stream?: No Erection problems?: No Penile pain?: No  Gastrointestinal Nausea?: No Vomiting?: No Indigestion/heartburn?: Yes Diarrhea?: No Constipation?: No  Constitutional Fever: No Night sweats?: No Weight loss?: Yes Fatigue?: No  Skin Skin  rash/lesions?: No Itching?: No  Eyes Blurred vision?: No Double vision?: No  Ears/Nose/Throat Sore throat?: No Sinus problems?: No  Hematologic/Lymphatic Swollen glands?: Yes Easy bruising?: No  Cardiovascular Leg swelling?: No Chest pain?: No  Respiratory Cough?: No Shortness of breath?: No  Endocrine Excessive thirst?: No  Musculoskeletal Back pain?: Yes Joint pain?: No  Neurological Headaches?: No Dizziness?: No  Psychologic Depression?: No Anxiety?: No  Physical Exam: BP 131/71 (BP Location: Left Arm, Patient Position: Sitting, Cuff Size: Normal)   Pulse 63   Ht 5\' 11"  (1.803 m)   Wt 165 lb 8 oz (75.1 kg)   BMI 23.08 kg/m   Constitutional: Well nourished. Alert and oriented, No acute distress. HEENT: [place] AT, moist mucus membranes. Trachea midline, no masses. Cardiovascular: No clubbing, cyanosis, or edema. Respiratory: Normal respiratory effort, no increased work of breathing. GI: Abdomen is soft, non  tender, non distended, no abdominal masses. Liver and spleen not palpable.  No hernias appreciated.  Stool sample for occult testing is not indicated.   GU: No CVA tenderness.  No bladder fullness or masses.  Patient with circumcised phallus.  Urethral meatus is patent.  No penile discharge. No penile lesions or rashes. Scrotum without lesions, cysts, rashes and/or edema.  Testicles are located scrotally bilaterally. No masses are appreciated in the testicles. Left and right epididymis are normal. Rectal: Patient with  normal sphincter tone. Anus and perineum without scarring or rashes. No rectal masses are appreciated. Prostate is approximately 45 grams, no nodules are appreciated. Seminal vesicles are normal. Skin: No rashes, bruises or suspicious lesions. Lymph: No cervical or inguinal adenopathy. Neurologic: Grossly intact, no focal deficits, moving all 4 extremities. Psychiatric: Normal mood and affect.  Laboratory Data: Lab Results  Component Value  Date   WBC 6.4 02/23/2017   HGB 13.7 02/23/2017   HCT 40.8 02/23/2017   MCV 97 02/23/2017   PLT 282 02/23/2017    Lab Results  Component Value Date   CREATININE 0.90 07/04/2018    Lab Results  Component Value Date   PSA 0.2 12/13/2017    No results found for: TESTOSTERONE  Lab Results  Component Value Date   HGBA1C 5.5 04/05/2016    Urinalysis Moderate bacteria.  See Epic. I have reviewed the labs.  Pertinent imaging Results for AROLDO, GALLI (MRN [id number]) as of 07/23/2018 15:18  Ref. Range 07/23/2018 14:30  Scan Result Unknown 97 ml   Assessment & Plan:   1. Dysuria UA with moderate bacteria, will send for culture and start Septra DS and will adjust if appropriate once urine sensitivities and cultures are available Patient is advised that if they should start to experience pain that is not able to be controlled with pain medication, intractable nausea and/or vomiting and/or fevers greater than 103 or shaking chills to contact the office immediately or seek treatment in the emergency department for emergent intervention.    2. BPH with LUTS Continue Flomax and finasteride  3. History of hematuria Hematuria work up completed in 11/2017 - findings positive for BOO No report of gross hematuria  UA today negative for hematuria RTC in one year for UA - patient to report any gross hematuria in the interim     Return for pending urine culture .  Zara Council, PA-C  [facility] [address] [place], [place] [address] [phone]

## 2018-07-26 LAB — CULTURE, URINE COMPREHENSIVE

## 2018-09-21 ENCOUNTER — Ambulatory Visit (INDEPENDENT_AMBULATORY_CARE_PROVIDER_SITE_OTHER): Payer: PPO | Admitting: Internal Medicine

## 2018-09-21 ENCOUNTER — Encounter: Payer: Self-pay | Admitting: Internal Medicine

## 2018-09-21 VITALS — BP 112/68 | HR 65 | Ht 71.0 in | Wt 171.6 lb

## 2018-09-21 DIAGNOSIS — I6522 Occlusion and stenosis of left carotid artery: Secondary | ICD-10-CM | POA: Diagnosis not present

## 2018-09-21 DIAGNOSIS — I495 Sick sinus syndrome: Secondary | ICD-10-CM | POA: Diagnosis not present

## 2018-09-21 NOTE — Progress Notes (Signed)
Date:  09/21/2018   Name:  Timothy Scott   DOB:  1946/08/23   MRN:  [id number]   Chief Complaint: Ct Scan (Had Ct scan done in October of 2019. Wants to know if he can get [place] to check any blockage in his carotid arteries. )  He continues to have fullness and choking sensation in his throat.  CT neck showed possible carotid and vertebra plaque.  He has never had a carotid US.[place] Kitchen His cholesterol levels have been good. SSS - he has spells of feeling weak but no syncope.  Cardiology thinks he may benefit from a pacemaker but he has been resistant.   Lab Results  Component Value Date   CHOL 160 03/26/2018   HDL 64 03/26/2018   LDLCALC 84 03/26/2018   TRIG 59 03/26/2018   CHOLHDL 2.5 03/26/2018   CT of Neck from October: IMPRESSION: No acute abnormality in the chest. No cause for dysphagia identified.  Atherosclerotic calcified plaque origin of left vertebral artery. Mild atherosclerotic disease left carotid bifurcation.  Sinus mucosal disease.  Review of Systems  Constitutional: Negative for unexpected weight change.  Respiratory: Positive for choking. Negative for chest tightness, shortness of breath and wheezing.   Cardiovascular: Negative for chest pain, palpitations and leg swelling.  Neurological: Positive for light-headedness. Negative for dizziness and headaches.    Patient Active Problem List   Diagnosis Date Noted  . Carotid artery plaque, left 07/09/2018  . Sick sinus syndrome ([place]) 03/09/2017  . Vertigo 12/12/2016  . Anosmia 05/05/2016  . Dysphagia 02/03/2016  . Tobacco use disorder, moderate, in sustained remission 03/11/2015  . Lumbar disc herniation with radiculopathy 03/11/2015  . Gonalgia 03/10/2015  . DDD (degenerative disc disease), cervical 03/10/2015  . Difficult or painful urination 03/10/2015  . Degenerative arthritis of finger 03/10/2015  . Post-traumatic stress disorder 03/10/2015  . Lattice degeneration 03/10/2015  . Leg varices 03/10/2015   . Sinus bradycardia 04/17/2014    No Known Allergies  Past Surgical History:  Procedure Laterality Date  . CATARACT EXTRACTION Right 2015  . CATARACT EXTRACTION W/PHACO Left 10/21/2015   Procedure: CATARACT EXTRACTION PHACO AND INTRAOCULAR LENS PLACEMENT (IOC);  Surgeon: Leandrew Koyanagi, MD;  Location: [facility];  Service: Ophthalmology;  Laterality: Left;  . COLONOSCOPY  2014   benign polyps  . CYSTOSCOPY    . ESOPHAGEAL MANOMETRY N/A 08/03/2016   Procedure: ESOPHAGEAL MANOMETRY (EM);  Surgeon: Mauri Pole, MD;  [facility];  Service: Endoscopy;  Laterality: N/A;  . ESOPHAGOGASTRODUODENOSCOPY (EGD) WITH PROPOFOL N/A 02/17/2016   with esoph dilatation  . TONSILLECTOMY      Social History   Tobacco Use  . Smoking status: Former Smoker    Packs/day: 0.50    Years: 20.00    Pack years: 10.00    Types: Cigarettes, Pipe, Cigars    Last attempt to quit: 04/05/1986    Years since quitting: 32.4  . Smokeless tobacco: Never Used  . Tobacco comment: smoking cessation materials not required  Substance Use Topics  . Alcohol use: Yes    Alcohol/week: 14.0 standard drinks    Types: 14 Standard drinks or equivalent per week    Comment: socially  . Drug use: No     Medication list has been reviewed and updated.  Current Meds  Medication Sig  . azelastine (ASTELIN) 0.1 % nasal spray   . Calcium Carbonate-Vit D-Min (CALCIUM 1200 PO) Take 1 mg by mouth.  . finasteride (PROSCAR) 5 MG tablet Take  5 mg by mouth daily.  . fluticasone (FLONASE) 50 MCG/ACT nasal spray Place 2 sprays into both nostrils daily. Reported on 03/01/2016  . glucosamine-chondroitin 500-400 MG tablet Take 1 tablet by mouth daily.  . Loperamide HCl (IMODIUM A-D PO) Take by mouth as needed.   . magnesium oxide (MAG-OX) 400 MG tablet Take 400 mg by mouth daily.  . naproxen (NAPROSYN) 500 MG tablet Take 500 mg by mouth 2 (two) times daily with a meal.  . omeprazole (PRILOSEC) 10 MG capsule  Take 10 mg by mouth daily.  . Probiotic Product (PROBIOTIC DAILY PO) Take by mouth.  . Saw Palmetto 450 MG CAPS Take by mouth daily.  . tamsulosin (FLOMAX) 0.4 MG CAPS capsule Take 0.4 mg by mouth.  . traZODone (DESYREL) 50 MG tablet Take 50 mg by mouth at bedtime.    PHQ 2/9 Scores 09/21/2018 03/21/2018 02/23/2017 02/15/2017  PHQ - 2 Score 0 0 0 1  PHQ- 9 Score - 0 - -    Physical Exam Vitals signs and nursing note reviewed.  Constitutional:      General: He is not in acute distress.    Appearance: He is well-developed.  HENT:     Head: Normocephalic and atraumatic.  Neck:     Musculoskeletal: Normal range of motion.     Vascular: No carotid bruit.  Cardiovascular:     Rate and Rhythm: Normal rate and regular rhythm.  Pulmonary:     Effort: Pulmonary effort is normal. No respiratory distress.  Musculoskeletal: Normal range of motion.  Lymphadenopathy:     Cervical: No cervical adenopathy.  Skin:    General: Skin is warm and dry.     Findings: No rash.  Neurological:     Mental Status: He is alert and oriented to person, place, and time.  Psychiatric:        Behavior: Behavior normal.        Thought Content: Thought content normal.     BP 112/68 (BP Location: Right Arm, Patient Position: Sitting, Cuff Size: Normal)   Pulse 65   Ht 5\' 11"  (1.803 m)   Wt 171 lb 9.6 oz (77.8 kg)   SpO2 98%   BMI 23.93 kg/m   Assessment and Plan: 1. Calcification of left carotid artery Would start low dose statin if any stenosis is seen - US Carotid Duplex Bilateral; Future  2. Sick sinus syndrome ([place]) Follow up with Cardiology to consider pacemaker   Partially dictated using [place]. Any errors are unintentional.  Halina Maidens, MD [facility]  09/21/2018

## 2018-09-27 ENCOUNTER — Ambulatory Visit
Admission: RE | Admit: 2018-09-27 | Discharge: 2018-09-27 | Disposition: A | Discharge status: Home / Self Care | Payer: PPO | Source: Ambulatory Visit | Attending: Internal Medicine | Admitting: Internal Medicine

## 2018-09-27 DIAGNOSIS — I6522 Occlusion and stenosis of left carotid artery: Secondary | ICD-10-CM | POA: Diagnosis not present

## 2018-09-28 ENCOUNTER — Other Ambulatory Visit: Payer: Self-pay | Admitting: Internal Medicine

## 2018-09-28 DIAGNOSIS — I6522 Occlusion and stenosis of left carotid artery: Secondary | ICD-10-CM

## 2018-09-28 MED ORDER — ATORVASTATIN CALCIUM 10 MG PO TABS: 10.0000 mg | ORAL_TABLET | Freq: Every day | ORAL | 1 refills | Status: DC

## 2018-10-03 ENCOUNTER — Encounter: Payer: Self-pay | Admitting: Urology

## 2018-10-03 ENCOUNTER — Ambulatory Visit: Payer: PPO | Admitting: Urology

## 2018-10-03 VITALS — BP 145/72 | HR 62 | Ht 71.0 in | Wt 168.0 lb

## 2018-10-03 DIAGNOSIS — N401 Enlarged prostate with lower urinary tract symptoms: Secondary | ICD-10-CM | POA: Diagnosis not present

## 2018-10-03 DIAGNOSIS — Z87448 Personal history of other diseases of urinary system: Secondary | ICD-10-CM

## 2018-10-03 LAB — URINALYSIS, COMPLETE
Bilirubin, UA: NEGATIVE
GLUCOSE, UA: NEGATIVE
Ketones, UA: NEGATIVE
NITRITE UA: NEGATIVE
PH UA: 5.5 (ref 5.0–7.5)
Protein, UA: NEGATIVE
Specific Gravity, UA: 1.025 (ref 1.005–1.030)
UUROB: 0.2 mg/dL (ref 0.2–1.0)

## 2018-10-03 LAB — MICROSCOPIC EXAMINATION

## 2018-10-03 NOTE — Progress Notes (Signed)
10/03/2018 10:05 AM   Timothy Scott 1946/04/27 [id number]  Referring provider: Glean Hess, MD [address] [place], [place] [address]  Chief Complaint  Patient presents with  . Follow-up    HPI: 73 year old male with a history of hematuria and BPH.  He saw Larene Beach in November 2019 complaining of dysuria.  Urinalysis did have pyuria.  He was treated with Septra however his urine culture returned no growth.  Shannon's note indicates he was to follow-up with her in 1 year however he states he received a call stating appointment with me was recommended to discuss surgery.  I am unable to locate this anywhere in the chart.  He remains on finasteride and tamsulosin.  He states his voiding pattern is stable.  He has intermittent worsening symptoms of frequency, postvoid dribbling and mild dysuria.  Denies recurrent gross hematuria.  Cystoscopy and March 2019 did show 5 cm prostatic urethra and lateral lobe enlargement with an intravesical median lobe.   PMH: Past Medical History:  Diagnosis Date  . Benign neoplasm of colon   . Chronic airway obstruction, not elsewhere classified   . DDD (degenerative disc disease), cervical   . Degeneration of cervical intervertebral disc   . Dysphagia   . Dysuria   . 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 Lattice degeneration of peripheral retina   . Leg varices 03/10/2015  . Osteoarthrosis, unspecified whether generalized or localized, hand   . Posttraumatic stress disorder   . Sinus bradycardia 04/17/2014  . Varicose veins of bilateral lower extremities with other complications   . Wears dentures    partial upper and lower    Surgical History: Past Surgical History:  Procedure Laterality Date  . CATARACT EXTRACTION Right 2015  . CATARACT EXTRACTION W/PHACO Left 10/21/2015   Procedure: CATARACT EXTRACTION PHACO AND INTRAOCULAR LENS PLACEMENT (IOC);  Surgeon: Leandrew Koyanagi, MD;  Location:  [facility];  Service: Ophthalmology;  Laterality: Left;  . COLONOSCOPY  2014   benign polyps  . CYSTOSCOPY    . ESOPHAGEAL MANOMETRY N/A 08/03/2016   Procedure: ESOPHAGEAL MANOMETRY (EM);  Surgeon: Mauri Pole, MD;  Location: [facility];  Service: Endoscopy;  Laterality: N/A;  . ESOPHAGOGASTRODUODENOSCOPY (EGD) WITH PROPOFOL N/A 02/17/2016   with esoph dilatation  . TONSILLECTOMY      Home Medications:  Allergies as of 10/03/2018   No Known Allergies     Medication List       Accurate as of October 03, 2018 10:05 AM. Always use your most recent med list.        atorvastatin 10 MG tablet Commonly known as:  LIPITOR Take 1 tablet (10 mg total) by mouth daily.   azelastine 0.1 % nasal spray Commonly known as:  ASTELIN   CALCIUM 1200 PO Take 1 mg by mouth.   finasteride 5 MG tablet Commonly known as:  PROSCAR Take 5 mg by mouth daily.   fluticasone 50 MCG/ACT nasal spray Commonly known as:  FLONASE Place 2 sprays into both nostrils daily. Reported on 03/01/2016   glucosamine-chondroitin 500-400 MG tablet Take 1 tablet by mouth daily.   IMODIUM A-D PO Take by mouth as needed.   magnesium oxide 400 MG tablet Commonly known as:  MAG-OX Take 400 mg by mouth daily.   naproxen 500 MG tablet Commonly known as:  NAPROSYN Take 500 mg by mouth 2 (two) times daily with a meal.   omeprazole 40 MG capsule Commonly known as:  PRILOSEC   PROBIOTIC DAILY PO Take by mouth.   Saw Palmetto 450 MG Caps Take by mouth daily.   tamsulosin 0.4 MG Caps capsule Commonly known as:  FLOMAX Take 0.4 mg by mouth.   traZODone 50 MG tablet Commonly known as:  DESYREL Take 50 mg by mouth at bedtime.       Allergies: No Known Allergies  Family History: Family History  Problem Relation Age of Onset  . Hypertension Mother   . Cancer Mother        breast  . Aneurysm Mother   . Healthy Sister   . Prostate cancer Neg Hx   . Kidney cancer Neg Hx   . Bladder  Cancer Neg Hx     Social History:  reports that he quit smoking about 32 years ago. His smoking use included cigarettes, pipe, and cigars. He has a 10.00 pack-year smoking history. He has never used smokeless tobacco. He reports current alcohol use of about 14.0 standard drinks of alcohol per week. He reports that he does not use drugs.  ROS: UROLOGY Frequent Urination?: Yes Hard to postpone urination?: No Burning/pain with urination?: Yes Get up at night to urinate?: Yes Leakage of urine?: No Urine stream starts and stops?: No Trouble starting stream?: No Do you have to strain to urinate?: No Blood in urine?: No Urinary tract infection?: No Sexually transmitted disease?: No Injury to kidneys or bladder?: No Painful intercourse?: No Weak stream?: No Erection problems?: No Penile pain?: No  Gastrointestinal Nausea?: No Vomiting?: No Indigestion/heartburn?: No Diarrhea?: No Constipation?: No  Constitutional Fever: No Night sweats?: No Weight loss?: Yes Fatigue?: Yes  Skin Skin rash/lesions?: No Itching?: No  Eyes Blurred vision?: No Double vision?: No  Ears/Nose/Throat Sore throat?: No Sinus problems?: No  Hematologic/Lymphatic Swollen glands?: No Easy bruising?: No  Cardiovascular Leg swelling?: No Chest pain?: No  Respiratory Cough?: No Shortness of breath?: No  Endocrine Excessive thirst?: No  Musculoskeletal Back pain?: No Joint pain?: Yes  Neurological Headaches?: No Dizziness?: No  Psychologic Depression?: No Anxiety?: No  Physical Exam: BP (!) 145/72 (BP Location: Left Arm, Patient Position: Sitting, Cuff Size: Normal)   Pulse 62   Ht 5\' 11"  (1.803 m)   Wt 168 lb (76.2 kg)   BMI 23.43 kg/m   Constitutional:  Alert and oriented, No acute distress. HEENT: [place] AT, moist mucus membranes.  Trachea midline, no masses. Cardiovascular: No clubbing, cyanosis, or edema. Respiratory: Normal respiratory effort, no increased work of  breathing. GI: Abdomen is soft, nontender, nondistended, no abdominal masses GU: No CVA tenderness Lymph: No cervical or inguinal lymphadenopathy. Skin: No rashes, bruises or suspicious lesions. Neurologic: Grossly intact, no focal deficits, moving all 4 extremities. Psychiatric: Normal mood and affect.   Assessment & Plan:   73 year old male with BPH and lower urinary tract symptoms on medical management.  He is currently satisfied with his voiding pattern and is presently not interested in any surgical procedure.  I did discuss TURP, PVP and UroLift.  He did have an intravesical median lobe however it is not noted if this is felt to be obstructive.  His prostate volume by CT was 43 cc.  He would like to continue medical management.  He is scheduled to see Larene Beach in November 2020.  He will return as needed for any worsening urinary symptoms.  Abbie Sons, [place] [address], [place], [place] [address] [phone]

## 2018-10-05 ENCOUNTER — Encounter: Payer: Self-pay | Admitting: Urology

## 2018-10-23 DIAGNOSIS — H1011 Acute atopic conjunctivitis, right eye: Secondary | ICD-10-CM | POA: Diagnosis not present

## 2018-11-15 ENCOUNTER — Other Ambulatory Visit: Payer: Self-pay

## 2018-11-15 ENCOUNTER — Ambulatory Visit (INDEPENDENT_AMBULATORY_CARE_PROVIDER_SITE_OTHER): Payer: PPO | Admitting: Internal Medicine

## 2018-11-15 ENCOUNTER — Encounter: Payer: Self-pay | Admitting: Internal Medicine

## 2018-11-15 VITALS — BP 98/60 | HR 72 | Ht 71.0 in | Wt 167.2 lb

## 2018-11-15 DIAGNOSIS — I495 Sick sinus syndrome: Secondary | ICD-10-CM

## 2018-11-15 DIAGNOSIS — R1319 Other dysphagia: Secondary | ICD-10-CM | POA: Diagnosis not present

## 2018-11-15 DIAGNOSIS — R001 Bradycardia, unspecified: Secondary | ICD-10-CM

## 2018-11-15 NOTE — Progress Notes (Signed)
Date:  11/15/2018   Name:  Timothy Scott   DOB:  March 06, 1946   MRN:  [id number]  Pt was seen at 8 AM but Epic was down.  Data entered when Epic functionality restored around 10:15.  Chief Complaint: Dizziness (EKG needed. Feels dizzy, but has not gone unconsious yet. No chest pain or SOB.)  Dizziness  This is a new problem. The current episode started 1 to 4 weeks ago. The problem occurs daily. The problem has been unchanged. Pertinent negatives include no abdominal pain, chest pain, coughing, fatigue, fever, nausea, visual change, vomiting or weakness. Associated symptoms comments: Chronic choking sensation. Nothing (but does not occur while exercising) aggravates the symptoms.  He was seen several years ago with SSS and was felt to be a candidate for a pacemaker which he chose not to pursue.  Review of Systems  Constitutional: Negative for fatigue and fever.  HENT: Negative for trouble swallowing.   Eyes: Negative for visual disturbance.  Respiratory: Negative for cough, chest tightness, shortness of breath and wheezing.   Cardiovascular: Negative for chest pain.  Gastrointestinal: Negative for abdominal pain, nausea and vomiting.  Allergic/Immunologic: Negative for environmental allergies.  Neurological: Positive for dizziness. Negative for weakness.  Psychiatric/Behavioral: Negative for dysphoric mood and sleep disturbance.    Patient Active Problem List   Diagnosis Date Noted  . Carotid artery plaque, left 07/09/2018  . Sick sinus syndrome ([place]) 03/09/2017  . Vertigo 12/12/2016  . Anosmia 05/05/2016  . Dysphagia 02/03/2016  . Tobacco use disorder, moderate, in sustained remission 03/11/2015  . Lumbar disc herniation with radiculopathy 03/11/2015  . Gonalgia 03/10/2015  . DDD (degenerative disc disease), cervical 03/10/2015  . Difficult or painful urination 03/10/2015  . Degenerative arthritis of finger 03/10/2015  . Post-traumatic stress disorder 03/10/2015  . Lattice  degeneration 03/10/2015  . Leg varices 03/10/2015  . Sinus bradycardia 04/17/2014    No Known Allergies  Past Surgical History:  Procedure Laterality Date  . CATARACT EXTRACTION Right 2015  . CATARACT EXTRACTION W/PHACO Left 10/21/2015   Procedure: CATARACT EXTRACTION PHACO AND INTRAOCULAR LENS PLACEMENT (IOC);  Surgeon: Leandrew Koyanagi, MD;  Location: [facility];  Service: Ophthalmology;  Laterality: Left;  . COLONOSCOPY  2014   benign polyps  . CYSTOSCOPY    . ESOPHAGEAL MANOMETRY N/A 08/03/2016   Procedure: ESOPHAGEAL MANOMETRY (EM);  Surgeon: Mauri Pole, MD;  [facility];  Service: Endoscopy;  Laterality: N/A;  . ESOPHAGOGASTRODUODENOSCOPY (EGD) WITH PROPOFOL N/A 02/17/2016   with esoph dilatation  . TONSILLECTOMY      Social History   Tobacco Use  . Smoking status: Former Smoker    Packs/day: 0.50    Years: 20.00    Pack years: 10.00    Types: Cigarettes, Pipe, Cigars    Last attempt to quit: 04/05/1986    Years since quitting: 32.6  . Smokeless tobacco: Never Used  . Tobacco comment: smoking cessation materials not required  Substance Use Topics  . Alcohol use: Yes    Alcohol/week: 14.0 standard drinks    Types: 14 Standard drinks or equivalent per week    Comment: socially  . Drug use: No     Medication list has been reviewed and updated.  Current Meds  Medication Sig  . atorvastatin (LIPITOR) 10 MG tablet Take 1 tablet (10 mg total) by mouth daily.  Marland Kitchen azelastine (ASTELIN) 0.1 % nasal spray   . Calcium Carbonate-Vit D-Min (CALCIUM 1200 PO) Take 1 mg by mouth.  Marland Kitchen  finasteride (PROSCAR) 5 MG tablet Take 5 mg by mouth daily.  . fluticasone (FLONASE) 50 MCG/ACT nasal spray Place 2 sprays into both nostrils daily. Reported on 03/01/2016  . glucosamine-chondroitin 500-400 MG tablet Take 1 tablet by mouth daily.  . Loperamide HCl (IMODIUM A-D PO) Take by mouth as needed.   . magnesium oxide (MAG-OX) 400 MG tablet Take 400 mg by mouth  daily.  . naproxen (NAPROSYN) 500 MG tablet Take 500 mg by mouth 2 (two) times daily with a meal.  . omeprazole (PRILOSEC) 40 MG capsule   . Probiotic Product (PROBIOTIC DAILY PO) Take by mouth.  . Saw Palmetto 450 MG CAPS Take by mouth daily.  . tamsulosin (FLOMAX) 0.4 MG CAPS capsule Take 0.4 mg by mouth.  . traZODone (DESYREL) 50 MG tablet Take 50 mg by mouth at bedtime.    PHQ 2/9 Scores 11/15/2018 09/21/2018 03/21/2018 02/23/2017  PHQ - 2 Score 0 0 0 0  PHQ- 9 Score - - 0 -    Physical Exam Vitals signs and nursing note reviewed.  Constitutional:      General: He is not in acute distress.    Appearance: Normal appearance. He is well-developed.  HENT:     Head: Normocephalic and atraumatic.  Neck:     Musculoskeletal: Normal range of motion and neck supple.     Vascular: No carotid bruit.  Cardiovascular:     Rate and Rhythm: Regular rhythm. Bradycardia present.     Heart sounds: No murmur. No gallop.   Pulmonary:     Effort: Pulmonary effort is normal. No respiratory distress.     Breath sounds: Normal breath sounds. No wheezing or rales.  Abdominal:     General: Abdomen is flat.     Palpations: Abdomen is soft.     Tenderness: There is no abdominal tenderness.  Musculoskeletal: Normal range of motion.     Right lower leg: No edema.     Left lower leg: No edema.  Skin:    General: Skin is warm and dry.     Findings: No rash.  Neurological:     Mental Status: He is alert and oriented to person, place, and time.  Psychiatric:        Behavior: Behavior normal.        Thought Content: Thought content normal.     Wt Readings from Last 3 Encounters:  11/15/18 167 lb 3.2 oz (75.8 kg)  10/03/18 168 lb (76.2 kg)  09/21/18 171 lb 9.6 oz (77.8 kg)    BP 98/60   Pulse 72   Ht 5\' 11"  (1.803 m)   Wt 167 lb 3.2 oz (75.8 kg)   SpO2 98%   BMI 23.32 kg/m   Assessment and Plan: 1. Sick sinus syndrome ([facility]) Suspect sx are occurring when HR drops below 50 Reassuring that  he has no sx with exercise He will follow up with Cardiology for further discussion/testing/consideration of pacemaker - EKG 12-Lead - SB @ 51; incomplete BBB  2. Sinus bradycardia As above Continue fluids, go to ED if syncope occurs  3. Other dysphagia Unchanged s/p complete workup   Partially dictated using Editor, commissioning. Any errors are unintentional.  Halina Maidens, MD [facility]  11/15/2018

## 2019-02-21 ENCOUNTER — Other Ambulatory Visit: Payer: PPO

## 2019-02-21 ENCOUNTER — Other Ambulatory Visit: Payer: Self-pay

## 2019-02-21 ENCOUNTER — Ambulatory Visit (INDEPENDENT_AMBULATORY_CARE_PROVIDER_SITE_OTHER): Payer: PPO | Admitting: Internal Medicine

## 2019-02-21 ENCOUNTER — Telehealth: Payer: Self-pay | Admitting: *Deleted

## 2019-02-21 ENCOUNTER — Encounter: Payer: Self-pay | Admitting: Internal Medicine

## 2019-02-21 VITALS — BP 135/57 | HR 43 | Temp 97.0°F | Ht 71.0 in | Wt 163.0 lb

## 2019-02-21 DIAGNOSIS — R6889 Other general symptoms and signs: Secondary | ICD-10-CM

## 2019-02-21 DIAGNOSIS — R1319 Other dysphagia: Secondary | ICD-10-CM

## 2019-02-21 DIAGNOSIS — Z20822 Contact with and (suspected) exposure to covid-19: Secondary | ICD-10-CM

## 2019-02-21 DIAGNOSIS — R4586 Emotional lability: Secondary | ICD-10-CM

## 2019-02-21 DIAGNOSIS — R634 Abnormal weight loss: Secondary | ICD-10-CM | POA: Diagnosis not present

## 2019-02-21 NOTE — Telephone Encounter (Signed)
-----   Message from Glean Hess, MD sent at 02/21/2019 11:47 AM EDT ----- Please schedule for Covid-19 testing.

## 2019-02-21 NOTE — Telephone Encounter (Signed)
Patient called to be scheduled for COVID-19 testing. Call was answered and background noise could be heard but no verbal response from the patient. Will attempt to contact.  Patient contacted and scheduled for testing at [facility] site on 02/21/19. Pt advised to remain in car and to wear a mask at appt time. Pt verbalized understanding.

## 2019-02-21 NOTE — Progress Notes (Signed)
Date:  02/21/2019   Name:  Timothy Scott   DOB:  02-17-1946   MRN:  [id number]  I connected with this patient, Timothy Scott , by telephone at the patient's home.  I verified that I am speaking with the correct person using two identifiers. This visit was conducted via telephone due to the Covid-19 outbreak from my office at [facility] in [place], [place]. I discussed the limitations, risks, security and privacy concerns of performing an evaluation and management service by telephone. I also discussed with the patient that there may be a patient responsible charge related to this service. The patient expressed understanding and agreed to proceed.  Chief Complaint: Fatigue (X 1 week- fatigue, nose running, sneezing. Cough- clear production. Loss of smell.  )  Cough This is a new problem. The current episode started in the past 7 days. The cough is non-productive. Associated symptoms include nasal congestion, postnasal drip and rhinorrhea. Pertinent negatives include no chest pain, chills, fever, headaches, sore throat, shortness of breath or wheezing. Associated symptoms comments: Taste is slightly decreased and sense of smell if off.. He has tried a beta-agonist inhaler for the symptoms. The treatment provided mild relief. His past medical history is significant for COPD.  Fatigue - really worse in the past week but ongoing for several weeks.  He admits to some depressive sx, loss of appetite and weight.  His globus sensation is progressively worse.  He has seen multiple specialists for this with no benefit and no cause found.  He trying to eat better and is using Boost supplements.    Review of Systems  Constitutional: Positive for fatigue. Negative for chills, diaphoresis, fever and unexpected weight change.  HENT: Positive for postnasal drip, rhinorrhea and trouble swallowing. Negative for sore throat.        Lost of taste and smell   Respiratory: Positive for cough. Negative for  shortness of breath and wheezing.   Cardiovascular: Negative for chest pain, palpitations and leg swelling.  Gastrointestinal: Negative for constipation, nausea and vomiting.  Neurological: Negative for dizziness, tremors, weakness, light-headedness and headaches.  Psychiatric/Behavioral: Positive for dysphoric mood (he feels this is mostly due to covid restrictions). Negative for sleep disturbance. The patient is not nervous/anxious.     Patient Active Problem List   Diagnosis Date Noted  . Carotid artery plaque, left 07/09/2018  . Sick sinus syndrome ([place]) 03/09/2017  . Vertigo 12/12/2016  . Anosmia 05/05/2016  . Dysphagia 02/03/2016  . Tobacco use disorder, moderate, in sustained remission 03/11/2015  . Lumbar disc herniation with radiculopathy 03/11/2015  . Gonalgia 03/10/2015  . DDD (degenerative disc disease), cervical 03/10/2015  . Difficult or painful urination 03/10/2015  . Degenerative arthritis of finger 03/10/2015  . Post-traumatic stress disorder 03/10/2015  . Lattice degeneration 03/10/2015  . Leg varices 03/10/2015  . Sinus bradycardia 04/17/2014    No Known Allergies  Past Surgical History:  Procedure Laterality Date  . CATARACT EXTRACTION Right 2015  . CATARACT EXTRACTION W/PHACO Left 10/21/2015   Procedure: CATARACT EXTRACTION PHACO AND INTRAOCULAR LENS PLACEMENT (IOC);  Surgeon: Leandrew Koyanagi, MD;  Location: [facility];  Service: Ophthalmology;  Laterality: Left;  . COLONOSCOPY  2014   benign polyps  . CYSTOSCOPY    . ESOPHAGEAL MANOMETRY N/A 08/03/2016   Procedure: ESOPHAGEAL MANOMETRY (EM);  Surgeon: Mauri Pole, MD;  [facility];  Service: Endoscopy;  Laterality: N/A;  . ESOPHAGOGASTRODUODENOSCOPY (EGD) WITH PROPOFOL N/A 02/17/2016   with esoph dilatation  .  TONSILLECTOMY      Social History   Tobacco Use  . Smoking status: Former Smoker    Packs/day: 0.50    Years: 20.00    Pack years: 10.00    Types: Cigarettes,  Pipe, Cigars    Quit date: 04/05/1986    Years since quitting: 32.9  . Smokeless tobacco: Never Used  . Tobacco comment: smoking cessation materials not required  Substance Use Topics  . Alcohol use: Yes    Alcohol/week: 14.0 standard drinks    Types: 14 Standard drinks or equivalent per week    Comment: socially  . Drug use: No     Medication list has been reviewed and updated.  Current Meds  Medication Sig  . atorvastatin (LIPITOR) 10 MG tablet Take 1 tablet (10 mg total) by mouth daily.  Marland Kitchen azelastine (ASTELIN) 0.1 % nasal spray   . Calcium Carbonate-Vit D-Min (CALCIUM 1200 PO) Take 1 mg by mouth.  . finasteride (PROSCAR) 5 MG tablet Take 5 mg by mouth daily.  . fluticasone (FLONASE) 50 MCG/ACT nasal spray Place 2 sprays into both nostrils daily. Reported on 03/01/2016  . glucosamine-chondroitin 500-400 MG tablet Take 1 tablet by mouth daily.  . Loperamide HCl (IMODIUM A-D PO) Take by mouth as needed.   . magnesium oxide (MAG-OX) 400 MG tablet Take 400 mg by mouth daily.  . naproxen (NAPROSYN) 500 MG tablet Take 500 mg by mouth 2 (two) times daily with a meal.  . omeprazole (PRILOSEC) 40 MG capsule Take 40 mg by mouth daily.   . Probiotic Product (PROBIOTIC DAILY PO) Take by mouth.  . Saw Palmetto 450 MG CAPS Take by mouth daily.  . tamsulosin (FLOMAX) 0.4 MG CAPS capsule Take 0.4 mg by mouth.  . traZODone (DESYREL) 50 MG tablet Take 50 mg by mouth at bedtime.    PHQ 2/9 Scores 02/21/2019 11/15/2018 09/21/2018 03/21/2018  PHQ - 2 Score 5 0 0 0  PHQ- 9 Score 9 - - 0    BP Readings from Last 3 Encounters:  02/21/19 (!) 135/57  11/15/18 98/60  10/03/18 (!) 145/72    Physical Exam Pulmonary:     Effort: Pulmonary effort is normal.     Comments: No cough or dyspnea noted during the call  Neurological:     Mental Status: He is alert.  Psychiatric:        Attention and Perception: Attention normal.        Mood and Affect: Mood normal.        Speech: Speech normal.         Cognition and Memory: Cognition normal.     Wt Readings from Last 3 Encounters:  02/21/19 163 lb (73.9 kg)  11/15/18 167 lb 3.2 oz (75.8 kg)  10/03/18 168 lb (76.2 kg)    BP (!) 135/57   Pulse (!) 43   Temp (!) 97 F (36.1 C) (Oral)   Ht 5\' 11"  (1.803 m)   Wt 163 lb (73.9 kg)   BMI 22.73 kg/m   Assessment and Plan: 1. Suspected Covid-19 Virus Infection With cough, loss of taste/smell at moderate risk if infected Will refer for testing  2. Mood disturbance Moderate sx at this time - pt will monitor for worsening and return if needed  3. Weight loss Continue healthy diet Continue Boost or Ensure supplements Monitor closely at home  4. Other dysphagia Slowly progressive Will reassess at next face to face visit  I spent 12 minutes on this call. Partially dictated  using Editor, commissioning. Any errors are unintentional.  Halina Maidens, MD [facility]  02/21/2019

## 2019-02-23 LAB — NOVEL CORONAVIRUS, NAA: SARS-CoV-2, NAA: NOT DETECTED

## 2019-04-01 ENCOUNTER — Other Ambulatory Visit: Payer: Self-pay

## 2019-04-01 ENCOUNTER — Ambulatory Visit (INDEPENDENT_AMBULATORY_CARE_PROVIDER_SITE_OTHER): Payer: PPO | Admitting: Internal Medicine

## 2019-04-01 ENCOUNTER — Encounter: Payer: Self-pay | Admitting: Internal Medicine

## 2019-04-01 VITALS — BP 96/58 | HR 61 | Ht 71.0 in | Wt 161.8 lb

## 2019-04-01 DIAGNOSIS — I495 Sick sinus syndrome: Secondary | ICD-10-CM

## 2019-04-01 DIAGNOSIS — R4586 Emotional lability: Secondary | ICD-10-CM | POA: Diagnosis not present

## 2019-04-01 DIAGNOSIS — R1319 Other dysphagia: Secondary | ICD-10-CM | POA: Diagnosis not present

## 2019-04-01 MED ORDER — BACLOFEN 5 MG PO TABS: 1.0000 | ORAL_TABLET | Freq: Three times a day (TID) | ORAL | 0 refills | Status: DC

## 2019-04-01 NOTE — Progress Notes (Signed)
Date:  04/01/2019   Name:  Timothy Scott   DOB:  1946/04/10   MRN:  [id number]   Chief Complaint: Choking (Years- ongoing. Recently getting worse. Feels like choking and gasping for air. ) and Depression (11- PHQ9. Fatigue, weight loss, mild depression, cannot get out of the house due to covid. )  HPI  Patient has been dealing globus sensation for several years.  Work up by numerous specialist with CT and MRI have not revealed a cause.  He tried gabapentin with no benefit.  He states that his sx seem to be getting worse.  Still not related to any activity - he feels like he is more short of breath but is not limiting in his activities.  He can eat and drink without difficulty. Mood disorder - he is followed by the [facility], on trazodone and recently given another medication which he can not name but which he can not take due to side effects.  He has not been back in touch with his provider. SSS - seen by cardiology about 18 mo ago.  Pacemaker was discussed but pt did not want to pursue at that time.  Now he is wondering if his globus sx are related.  He denies chest pain or syncope.  He has chronic shortness of breath which is not limiting.  Review of Systems  Constitutional: Positive for unexpected weight change. Negative for chills, fatigue and fever.  HENT: Negative for postnasal drip, trouble swallowing and voice change.   Respiratory: Positive for shortness of breath. Negative for cough, chest tightness and wheezing.   Cardiovascular: Negative for chest pain, palpitations and leg swelling.  Gastrointestinal: Negative for abdominal pain.  Neurological: Negative for dizziness, syncope and headaches.  Psychiatric/Behavioral: Positive for dysphoric mood. Negative for sleep disturbance.    Patient Active Problem List   Diagnosis Date Noted  . Mood disturbance 02/21/2019  . Weight loss 02/21/2019  . Carotid artery plaque, left 07/09/2018  . Sick sinus syndrome ([place]) 03/09/2017  . Vertigo  12/12/2016  . Anosmia 05/05/2016  . Dysphagia 02/03/2016  . Tobacco use disorder, moderate, in sustained remission 03/11/2015  . Lumbar disc herniation with radiculopathy 03/11/2015  . Gonalgia 03/10/2015  . DDD (degenerative disc disease), cervical 03/10/2015  . Difficult or painful urination 03/10/2015  . Degenerative arthritis of finger 03/10/2015  . Post-traumatic stress disorder 03/10/2015  . Lattice degeneration 03/10/2015  . Leg varices 03/10/2015  . Sinus bradycardia 04/17/2014    No Known Allergies  Past Surgical History:  Procedure Laterality Date  . CATARACT EXTRACTION Right 2015  . CATARACT EXTRACTION W/PHACO Left 10/21/2015   Procedure: CATARACT EXTRACTION PHACO AND INTRAOCULAR LENS PLACEMENT (IOC);  Surgeon: Leandrew Koyanagi, MD;  Location: [facility];  Service: Ophthalmology;  Laterality: Left;  . COLONOSCOPY  2014   benign polyps  . CYSTOSCOPY    . ESOPHAGEAL MANOMETRY N/A 08/03/2016   Procedure: ESOPHAGEAL MANOMETRY (EM);  Surgeon: Mauri Pole, MD;  [facility];  Service: Endoscopy;  Laterality: N/A;  . ESOPHAGOGASTRODUODENOSCOPY (EGD) WITH PROPOFOL N/A 02/17/2016   with esoph dilatation  . TONSILLECTOMY      Social History   Tobacco Use  . Smoking status: Former Smoker    Packs/day: 0.50    Years: 20.00    Pack years: 10.00    Types: Cigarettes, Pipe, Cigars    Quit date: 04/05/1986    Years since quitting: 33.0  . Smokeless tobacco: Never Used  . Tobacco comment: smoking cessation materials  not required  Substance Use Topics  . Alcohol use: Yes    Alcohol/week: 14.0 standard drinks    Types: 14 Standard drinks or equivalent per week    Comment: socially  . Drug use: No     Medication list has been reviewed and updated.  Current Meds  Medication Sig  . atorvastatin (LIPITOR) 10 MG tablet Take 1 tablet (10 mg total) by mouth daily.  Marland Kitchen azelastine (ASTELIN) 0.1 % nasal spray   . Calcium Carbonate-Vit D-Min (CALCIUM  1200 PO) Take 1 mg by mouth.  . finasteride (PROSCAR) 5 MG tablet Take 5 mg by mouth daily.  . fluticasone (FLONASE) 50 MCG/ACT nasal spray Place 2 sprays into both nostrils daily. Reported on 03/01/2016  . glucosamine-chondroitin 500-400 MG tablet Take 1 tablet by mouth daily.  . Loperamide HCl (IMODIUM A-D PO) Take by mouth as needed.   . magnesium oxide (MAG-OX) 400 MG tablet Take 400 mg by mouth daily.  . naproxen (NAPROSYN) 500 MG tablet Take 500 mg by mouth 2 (two) times daily with a meal.  . omeprazole (PRILOSEC) 40 MG capsule Take 40 mg by mouth daily.   . Probiotic Product (PROBIOTIC DAILY PO) Take by mouth.  . Saw Palmetto 450 MG CAPS Take by mouth daily.  . tamsulosin (FLOMAX) 0.4 MG CAPS capsule Take 0.4 mg by mouth.  . traZODone (DESYREL) 50 MG tablet Take 50 mg by mouth at bedtime.    PHQ 2/9 Scores 04/01/2019 02/21/2019 11/15/2018 09/21/2018  PHQ - 2 Score 4 5 0 0  PHQ- 9 Score 11 9 - -    BP Readings from Last 3 Encounters:  04/01/19 (!) 96/58  02/21/19 (!) 135/57  11/15/18 98/60    Physical Exam Vitals signs and nursing note reviewed.  Constitutional:      General: He is not in acute distress.    Appearance: Normal appearance. He is well-developed.  HENT:     Head: Normocephalic and atraumatic.  Eyes:     Pupils: Pupils are equal, round, and reactive to light.  Neck:     Musculoskeletal: Normal range of motion.     Vascular: No carotid bruit.  Cardiovascular:     Rate and Rhythm: Regular rhythm. Bradycardia present.     Heart sounds: No murmur.  Pulmonary:     Effort: Pulmonary effort is normal. No respiratory distress.     Breath sounds: No wheezing or rhonchi.  Musculoskeletal: Normal range of motion.     Right lower leg: No edema.     Left lower leg: No edema.  Lymphadenopathy:     Cervical: No cervical adenopathy.  Skin:    General: Skin is warm and dry.     Capillary Refill: Capillary refill takes less than 2 seconds.     Findings: No rash.   Neurological:     Mental Status: He is alert and oriented to person, place, and time.  Psychiatric:        Attention and Perception: Attention normal.        Mood and Affect: Mood is depressed.        Behavior: Behavior normal.        Thought Content: Thought content normal. Thought content does not include suicidal ideation. Thought content does not include suicidal plan.        Cognition and Memory: Cognition normal.     Wt Readings from Last 3 Encounters:  04/01/19 161 lb 12.8 oz (73.4 kg)  02/21/19 163 lb (73.9 kg)  11/15/18 167 lb 3.2 oz (75.8 kg)    BP (!) 96/58   Pulse 61   Ht 5\' 11"  (1.803 m)   Wt 161 lb 12.8 oz (73.4 kg)   SpO2 98%   BMI 22.57 kg/m   Assessment and Plan: 1. Other dysphagia Will try mild muscle relaxant to see if he derived benefit - Baclofen 5 MG TABS; Take 1 tablet by mouth 3 (three) times daily.  Dispense: 90 tablet; Refill: 0  2. Sick sinus syndrome [facility]) Recommend consultation with Cardiology again to discuss pacemaker - I do not think it would help his globus sx  3. Mood disturbance Continue trazodone He may benefit from further treatment - he is urged to contact his provider to let them know that he could not tolerate the last prescription.   Partially dictated using Editor, commissioning. Any errors are unintentional.  Halina Maidens, MD [facility]  04/01/2019

## 2019-04-15 ENCOUNTER — Ambulatory Visit (INDEPENDENT_AMBULATORY_CARE_PROVIDER_SITE_OTHER): Payer: PPO

## 2019-04-15 ENCOUNTER — Other Ambulatory Visit: Payer: Self-pay

## 2019-04-15 VITALS — BP 122/66 | HR 51 | Temp 98.1°F | Resp 16 | Ht 71.0 in | Wt 163.8 lb

## 2019-04-15 DIAGNOSIS — Z0000 Encounter for general adult medical examination without abnormal findings: Secondary | ICD-10-CM | POA: Diagnosis not present

## 2019-04-15 NOTE — Patient Instructions (Signed)
Timothy Scott , Thank you for taking time to come for your Medicare Wellness Visit. I appreciate your ongoing commitment to your health goals. Please review the following plan we discussed and let me know if I can assist you in the future.   Screening recommendations/referrals: Colonoscopy: done 05/31/13. Due for repeat screening.  Recommended yearly ophthalmology/optometry visit for glaucoma screening and checkup Recommended yearly dental visit for hygiene and checkup  Vaccinations: Influenza vaccine: done 06/18/18 Pneumococcal vaccine: done 05/28/15 Tdap vaccine: done 2012 Shingles vaccine: Shingrix series completed 10/12/18     Advanced directives: Please bring a copy of your health care power of attorney and living will to the office at your convenience.  Conditions/risks identified: Recommend increasing physical activity to 150 minutes per week  Next appointment: Please follow up in one year for your Medicare Annual Wellness visit.    Preventive Care 74 Years and Older, Male Preventive care refers to lifestyle choices and visits with your health care provider that can promote health and wellness. What does preventive care include?  A yearly physical exam. This is also called an annual well check.  Dental exams once or twice a year.  Routine eye exams. Ask your health care provider how often you should have your eyes checked.  Personal lifestyle choices, including:  Daily care of your teeth and gums.  Regular physical activity.  Eating a healthy diet.  Avoiding tobacco and drug use.  Limiting alcohol use.  Practicing safe sex.  Taking low doses of aspirin every day.  Taking vitamin and mineral supplements as recommended by your health care provider. What happens during an annual well check? The services and screenings done by your health care provider during your annual well check will depend on your age, overall health, lifestyle risk factors, and family history of  disease. Counseling  Your health care provider may ask you questions about your:  Alcohol use.  Tobacco use.  Drug use.  Emotional well-being.  Home and relationship well-being.  Sexual activity.  Eating habits.  History of falls.  Memory and ability to understand (cognition).  Work and work Statistician. Screening  You may have the following tests or measurements:  Height, weight, and BMI.  Blood pressure.  Lipid and cholesterol levels. These may be checked every 5 years, or more frequently if you are over 21 years old.  Skin check.  Lung cancer screening. You may have this screening every year starting at age 4 if you have a 30-pack-year history of smoking and currently smoke or have quit within the past 15 years.  Fecal occult blood test (FOBT) of the stool. You may have this test every year starting at age 70.  Flexible sigmoidoscopy or colonoscopy. You may have a sigmoidoscopy every 5 years or a colonoscopy every 10 years starting at age 31.  Prostate cancer screening. Recommendations will vary depending on your family history and other risks.  Hepatitis C blood test.  Hepatitis B blood test.  Sexually transmitted disease (STD) testing.  Diabetes screening. This is done by checking your blood sugar (glucose) after you have not eaten for a while (fasting). You may have this done every 1-3 years.  Abdominal aortic aneurysm (AAA) screening. You may need this if you are a current or former smoker.  Osteoporosis. You may be screened starting at age 29 if you are at high risk. Talk with your health care provider about your test results, treatment options, and if necessary, the need for more tests. Vaccines  Your  health care provider may recommend certain vaccines, such as:  Influenza vaccine. This is recommended every year.  Tetanus, diphtheria, and acellular pertussis (Tdap, Td) vaccine. You may need a Td booster every 10 years.  Zoster vaccine. You may  need this after age 61.  Pneumococcal 13-valent conjugate (PCV13) vaccine. One dose is recommended after age 47.  Pneumococcal polysaccharide (PPSV23) vaccine. One dose is recommended after age 73. Talk to your health care provider about which screenings and vaccines you need and how often you need them. This information is not intended to replace advice given to you by your health care provider. Make sure you discuss any questions you have with your health care provider. Document Released: 09/18/2015 Document Revised: 05/11/2016 Document Reviewed: 06/23/2015 Elsevier Interactive Patient Education  2017 [place] Prevention in the Home Falls can cause injuries. They can happen to people of all ages. There are many things you can do to make your home safe and to help prevent falls. What can I do on the outside of my home?  Regularly fix the edges of walkways and driveways and fix any cracks.  Remove anything that might make you trip as you walk through a door, such as a raised step or threshold.  Trim any bushes or trees on the path to your home.  Use bright outdoor lighting.  Clear any walking paths of anything that might make someone trip, such as rocks or tools.  Regularly check to see if handrails are loose or broken. Make sure that both sides of any steps have handrails.  Any raised decks and porches should have guardrails on the edges.  Have any leaves, snow, or ice cleared regularly.  Use sand or salt on walking paths during winter.  Clean up any spills in your garage right away. This includes oil or grease spills. What can I do in the bathroom?  Use night lights.  Install grab bars by the toilet and in the tub and shower. Do not use towel bars as grab bars.  Use non-skid mats or decals in the tub or shower.  If you need to sit down in the shower, use a plastic, non-slip stool.  Keep the floor dry. Clean up any water that spills on the floor as soon as it  happens.  Remove soap buildup in the tub or shower regularly.  Attach bath mats securely with double-sided non-slip rug tape.  Do not have throw rugs and other things on the floor that can make you trip. What can I do in the bedroom?  Use night lights.  Make sure that you have a light by your bed that is easy to reach.  Do not use any sheets or blankets that are too big for your bed. They should not hang down onto the floor.  Have a firm chair that has side arms. You can use this for support while you get dressed.  Do not have throw rugs and other things on the floor that can make you trip. What can I do in the kitchen?  Clean up any spills right away.  Avoid walking on wet floors.  Keep items that you use a lot in easy-to-reach places.  If you need to reach something above you, use a strong step stool that has a grab bar.  Keep electrical cords out of the way.  Do not use floor polish or wax that makes floors slippery. If you must use wax, use non-skid floor wax.  Do not  have throw rugs and other things on the floor that can make you trip. What can I do with my stairs?  Do not leave any items on the stairs.  Make sure that there are handrails on both sides of the stairs and use them. Fix handrails that are broken or loose. Make sure that handrails are as long as the stairways.  Check any carpeting to make sure that it is firmly attached to the stairs. Fix any carpet that is loose or worn.  Avoid having throw rugs at the top or bottom of the stairs. If you do have throw rugs, attach them to the floor with carpet tape.  Make sure that you have a light switch at the top of the stairs and the bottom of the stairs. If you do not have them, ask someone to add them for you. What else can I do to help prevent falls?  Wear shoes that:  Do not have high heels.  Have rubber bottoms.  Are comfortable and fit you well.  Are closed at the toe. Do not wear sandals.  If you  use a stepladder:  Make sure that it is fully opened. Do not climb a closed stepladder.  Make sure that both sides of the stepladder are locked into place.  Ask someone to hold it for you, if possible.  Clearly mark and make sure that you can see:  Any grab bars or handrails.  First and last steps.  Where the edge of each step is.  Use tools that help you move around (mobility aids) if they are needed. These include:  Canes.  Walkers.  Scooters.  Crutches.  Turn on the lights when you go into a dark area. Replace any light bulbs as soon as they burn out.  Set up your furniture so you have a clear path. Avoid moving your furniture around.  If any of your floors are uneven, fix them.  If there are any pets around you, be aware of where they are.  Review your medicines with your doctor. Some medicines can make you feel dizzy. This can increase your chance of falling. Ask your doctor what other things that you can do to help prevent falls. This information is not intended to replace advice given to you by your health care provider. Make sure you discuss any questions you have with your health care provider. Document Released: 06/18/2009 Document Revised: 01/28/2016 Document Reviewed: 09/26/2014 Elsevier Interactive Patient Education  2017 [employer].

## 2019-04-15 NOTE — Progress Notes (Signed)
Subjective:   Timothy Scott is a 73 y.o. male who presents for Medicare Annual/Subsequent preventive examination.  Review of Systems:   Cardiac Risk Factors include: advanced age (>30men, >44 women);dyslipidemia;male gender     Objective:    Vitals: BP 122/66 (BP Location: Left Arm, Patient Position: Sitting, Cuff Size: Normal)   Pulse (!) 51   Temp 98.1 F (36.7 C) (Oral)   Resp 16   Ht 5\' 11"  (1.803 m)   Wt 163 lb 12.8 oz (74.3 kg)   SpO2 98%   BMI 22.85 kg/m   Body mass index is 22.85 kg/m.  Advanced Directives 04/15/2019 03/21/2018 05/05/2016 02/17/2016 10/21/2015  Does Patient Have a Medical Advance Directive? Yes Yes No No Yes  Type of Paramedic of [place];Living will [place];Living will - - -  Copy of [place] in Chart? No - copy requested No - copy requested - - -    Tobacco Social History   Tobacco Use  Smoking Status Former Smoker  . Packs/day: 0.50  . Years: 20.00  . Pack years: 10.00  . Types: Cigarettes, Pipe, Cigars  . Quit date: 04/05/1986  . Years since quitting: 33.0  Smokeless Tobacco Never Used  Tobacco Comment   smoking cessation materials not required     Counseling given: Not Answered Comment: smoking cessation materials not required   Clinical Intake:  Pre-visit preparation completed: Yes  Pain : No/denies pain Pain Score: 0-No pain     BMI - recorded: 22.85 Nutritional Status: BMI of 19-24  Normal Nutritional Risks: None Diabetes: No  How often do you need to have someone help you when you read instructions, pamphlets, or other written materials from your doctor or pharmacy?: 1 - Never  Interpreter Needed?: No  Information entered by :: Clemetine Marker LPN  Past Medical History:  Diagnosis Date  . Benign neoplasm of colon   . Chronic airway obstruction, not elsewhere classified   . DDD (degenerative disc disease), cervical   . Degeneration of cervical  intervertebral disc   . Dysphagia   . Dysuria   . Hyperlipidemia   . 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 Lattice degeneration of peripheral retina   . Leg varices 03/10/2015  . Osteoarthrosis, unspecified whether generalized or localized, hand   . Posttraumatic stress disorder   . Sinus bradycardia 04/17/2014  . Varicose veins of bilateral lower extremities with other complications   . Wears dentures    partial upper and lower   Past Surgical History:  Procedure Laterality Date  . CATARACT EXTRACTION Right 2015  . CATARACT EXTRACTION W/PHACO Left 10/21/2015   Procedure: CATARACT EXTRACTION PHACO AND INTRAOCULAR LENS PLACEMENT (IOC);  Surgeon: Leandrew Koyanagi, MD;  Location: [facility];  Service: Ophthalmology;  Laterality: Left;  . COLONOSCOPY  2014   benign polyps  . CYSTOSCOPY    . ESOPHAGEAL MANOMETRY N/A 08/03/2016   Procedure: ESOPHAGEAL MANOMETRY (EM);  Surgeon: Mauri Pole, MD;  Location: [facility];  Service: Endoscopy;  Laterality: N/A;  . ESOPHAGOGASTRODUODENOSCOPY (EGD) WITH PROPOFOL N/A 02/17/2016   with esoph dilatation  . TONSILLECTOMY     Family History  Problem Relation Age of Onset  . Hypertension Mother   . Cancer Mother        breast  . Aneurysm Mother   . Healthy Sister   . Prostate cancer Neg Hx   . Kidney cancer Neg Hx   . Bladder  Cancer Neg Hx    Social History   Socioeconomic History  . Marital status: Divorced    Spouse name: Not on file  . Number of children: 1  . Years of education: some college  . Highest education level: 12th grade  Occupational History  . Occupation: Retired  Scientific laboratory technician  . Financial resource strain: Not hard at all  . Food insecurity    Worry: Never true    Inability: Never true  . Transportation needs    Medical: No    Non-medical: No  Tobacco Use  . Smoking status: Former Smoker    Packs/day: 0.50    Years: 20.00    Pack years: 10.00    Types: Cigarettes,  Pipe, Cigars    Quit date: 04/05/1986    Years since quitting: 33.0  . Smokeless tobacco: Never Used  . Tobacco comment: smoking cessation materials not required  Substance and Sexual Activity  . Alcohol use: Yes    Alcohol/week: 14.0 standard drinks    Types: 14 Standard drinks or equivalent per week    Comment: socially  . Drug use: No  . Sexual activity: Not Currently  Lifestyle  . Physical activity    Days per week: 0 days    Minutes per session: 0 min  . Stress: Not at all  Relationships  . Social Herbalist on phone: Patient refused    Gets together: Patient refused    Attends religious service: Patient refused    Active member of club or organization: Patient refused    Attends meetings of clubs or organizations: Patient refused    Relationship status: Divorced  Other Topics Concern  . Not on file  Social History Narrative  . Not on file    Outpatient Encounter Medications as of 04/15/2019  Medication Sig  . atorvastatin (LIPITOR) 10 MG tablet Take 1 tablet (10 mg total) by mouth daily.  Marland Kitchen azelastine (ASTELIN) 0.1 % nasal spray   . baclofen (LIORESAL) 10 MG tablet Take 5 mg by mouth 3 (three) times daily.   . Calcium Carbonate-Vit D-Min (CALCIUM 1200 PO) Take 1 mg by mouth.  . finasteride (PROSCAR) 5 MG tablet Take 5 mg by mouth daily.  . fluticasone (FLONASE) 50 MCG/ACT nasal spray Place 2 sprays into both nostrils daily. Reported on 03/01/2016  . glucosamine-chondroitin 500-400 MG tablet Take 1 tablet by mouth daily.  . Loperamide HCl (IMODIUM A-D PO) Take by mouth as needed.   . naproxen (NAPROSYN) 500 MG tablet Take 500 mg by mouth 2 (two) times daily with a meal.  . omeprazole (PRILOSEC) 40 MG capsule Take 40 mg by mouth daily.   . Probiotic Product (PROBIOTIC DAILY PO) Take by mouth.  . Saw Palmetto 450 MG CAPS Take by mouth daily.  . tamsulosin (FLOMAX) 0.4 MG CAPS capsule Take 0.4 mg by mouth.  . traZODone (DESYREL) 50 MG tablet Take 50 mg by mouth  at bedtime.  . [DISCONTINUED] Baclofen 5 MG TABS Take 1 tablet by mouth 3 (three) times daily.  . [DISCONTINUED] magnesium oxide (MAG-OX) 400 MG tablet Take 400 mg by mouth daily.   No facility-administered encounter medications on file as of 04/15/2019.     Activities of Daily Living In your present state of health, do you have any difficulty performing the following activities: 04/15/2019 04/01/2019  Hearing? N N  Comment declines hearing aids -  Vision? N N  Difficulty concentrating or making decisions? N N  Walking or climbing  stairs? N N  Dressing or bathing? N N  Doing errands, shopping? N N  Preparing Food and eating ? N -  Using the Toilet? N -  In the past six months, have you accidently leaked urine? Y -  Comment prostate issues -  Do you have problems with loss of bowel control? N -  Managing your Medications? N -  Managing your Finances? N -  Housekeeping or managing your Housekeeping? N -  Some recent data might be hidden    Patient Care Team: Glean Hess, MD as PCP - General (Internal Medicine) Wellington Hampshire, MD as Consulting Physician (Cardiology) Manya Silvas, MD (Gastroenterology)   Assessment:   This is a routine wellness examination for Dondi.  Exercise Activities and Dietary recommendations Current Exercise Habits: The patient does not participate in regular exercise at present, Exercise limited by: None identified  Goals    . DIET - INCREASE WATER INTAKE     Recommend to drink at least 6-8 8oz glasses of water per day.       Fall Risk Fall Risk  04/15/2019 02/21/2019 11/15/2018 09/21/2018 03/21/2018  Falls in the past year? 0 0 0 0 No  Number falls in past yr: 0 0 0 0 -  Injury with Fall? 0 0 0 0 -  Risk for fall due to : - - - - Medication side effect;Other (Comment)  Risk for fall due to: Comment - - - - vertigo  Follow up Falls prevention discussed Falls evaluation completed Falls evaluation completed Falls evaluation completed -    FALL RISK PREVENTION PERTAINING TO THE HOME:  Any stairs in or around the home? No  If so, do they handrails? No   Home free of loose throw rugs in walkways, pet beds, electrical cords, etc? Yes  Adequate lighting in your home to reduce risk of falls? Yes   ASSISTIVE DEVICES UTILIZED TO PREVENT FALLS:  Life alert? No  Use of a cane, walker or w/c? No  Grab bars in the bathroom? No  Shower chair or bench in shower? No  Elevated toilet seat or a handicapped toilet? No   DME ORDERS:  DME order needed?  No   TIMED UP AND GO:  Was the test performed? Yes .  Length of time to ambulate 10 feet: 5 sec.   GAIT:  Appearance of gait: Gait stead-fast and without the use of an assistive device.   Education: Fall risk prevention has been discussed.  Intervention(s) required? No   Depression Screen PHQ 2/9 Scores 04/15/2019 04/01/2019 02/21/2019 11/15/2018  PHQ - 2 Score 2 4 5  0  PHQ- 9 Score 10 11 9  -    Cognitive Function     6CIT Screen 04/15/2019 03/21/2018  What Year? 0 points 0 points  What month? 0 points 0 points  What time? 0 points 0 points  Count back from 20 0 points 0 points  Months in reverse 0 points 0 points  Repeat phrase 2 points 0 points  Total Score 2 0    Immunization History  Administered Date(s) Administered  . Influenza,inj,Quad PF,6+ Mos 07/21/2017, 06/18/2018  . Influenza-Unspecified 09/06/2015, 06/18/2018  . Pneumococcal Conjugate-13 05/28/2015  . Pneumococcal Polysaccharide-23 09/06/2010  . Tdap 09/06/2010  . Zoster 09/06/2010  . Zoster Recombinat (Shingrix) 08/07/2018, 10/12/2018    Qualifies for Shingles Vaccine? Shingrix series complete.  Tdap: Up to date  Flu Vaccine: Up to date  Pneumococcal Vaccine: Up to date  Screening Tests Health Maintenance  Topic Date Due  . Hepatitis C Screening  13-Jun-1946  . COLONOSCOPY  05/31/2018  . INFLUENZA VACCINE  04/06/2019  . TETANUS/TDAP  09/06/2020  . PNA vac Low Risk Adult  Completed    Cancer Screenings:  Colorectal Screening: Completed 05/31/13. Repeat every 5 years; Pt requested to postpone referral in order to decide if he wants to stay with Dr. Vira Agar with [facility] or switch to [facility] due to HTA ins.   Lung Cancer Screening: (Low Dose CT Chest recommended if Age 79-80 years, 30 pack-year currently smoking OR have quit w/in 15years.) does not qualify.   Additional Screening:  Hepatitis C Screening: does qualify; postponed  Vision Screening: Recommended annual ophthalmology exams for early detection of glaucoma and other disorders of the eye. Is the patient up to date with their annual eye exam?  Yes  Who is the provider or what is the name of the office in which the pt attends annual eye exams? Dr. Wallace Going  Dental Screening: Recommended annual dental exams for proper oral hygiene  Community Resource Referral:  CRR required this visit?  No       Plan:    I have personally reviewed and addressed the Medicare Annual Wellness questionnaire and have noted the following in the patient's chart:  A. Medical and social history B. Use of alcohol, tobacco or illicit drugs  C. Current medications and supplements D. Functional ability and status E.  Nutritional status F.  Physical activity G. Advance directives H. List of other physicians I.  Hospitalizations, surgeries, and ER visits in previous 12 months J.  [place] such as hearing and vision if needed, cognitive and depression L. Referrals and appointments   In addition, I have reviewed and discussed with patient certain preventive protocols, quality metrics, and best practice recommendations. A written personalized care plan for preventive services as well as general preventive health recommendations were provided to patient.   Signed,  Clemetine Marker, LPN Nurse Health Advisor   Nurse Notes: pt c/o worsening dysphagia to the point of shortness of breath and coughing/gagging. Due to this as  an ongoing issue per Dr. Army Melia patient may need to be referred to GI. Pt states he will decide if he wants to go to Dr. Vira Agar or [facility]. Pt also stated he will go to urgent care if sxs continue to worsen.

## 2019-04-30 ENCOUNTER — Telehealth: Payer: Self-pay | Admitting: Internal Medicine

## 2019-04-30 ENCOUNTER — Other Ambulatory Visit: Payer: Self-pay | Admitting: Internal Medicine

## 2019-04-30 DIAGNOSIS — R1319 Other dysphagia: Secondary | ICD-10-CM

## 2019-04-30 NOTE — Telephone Encounter (Signed)
Pt talked to [facility] about a referral during his MAW, was told to call back to follow up? [place] Miners had told dr.B about him wanting to be seen at [facility] and she would put the referral in.

## 2019-04-30 NOTE — Telephone Encounter (Signed)
Please advise previous message  

## 2019-04-30 NOTE — Telephone Encounter (Signed)
Patient would like to see [facility].

## 2019-04-30 NOTE — Telephone Encounter (Signed)
Per Roswell Miners, he was to decide if he wanted to see Dr. Vira Agar again or be referred to [facility].

## 2019-05-09 ENCOUNTER — Other Ambulatory Visit: Payer: Self-pay

## 2019-05-09 ENCOUNTER — Ambulatory Visit (INDEPENDENT_AMBULATORY_CARE_PROVIDER_SITE_OTHER): Payer: PPO | Admitting: Internal Medicine

## 2019-05-09 ENCOUNTER — Encounter: Payer: Self-pay | Admitting: Internal Medicine

## 2019-05-09 VITALS — BP 112/68 | HR 63 | Ht 71.0 in | Wt 162.0 lb

## 2019-05-09 DIAGNOSIS — F4310 Post-traumatic stress disorder, unspecified: Secondary | ICD-10-CM | POA: Diagnosis not present

## 2019-05-09 DIAGNOSIS — I6522 Occlusion and stenosis of left carotid artery: Secondary | ICD-10-CM

## 2019-05-09 DIAGNOSIS — Z1159 Encounter for screening for other viral diseases: Secondary | ICD-10-CM

## 2019-05-09 DIAGNOSIS — Z125 Encounter for screening for malignant neoplasm of prostate: Secondary | ICD-10-CM | POA: Diagnosis not present

## 2019-05-09 DIAGNOSIS — Z1211 Encounter for screening for malignant neoplasm of colon: Secondary | ICD-10-CM | POA: Diagnosis not present

## 2019-05-09 DIAGNOSIS — M542 Cervicalgia: Secondary | ICD-10-CM | POA: Diagnosis not present

## 2019-05-09 DIAGNOSIS — Z0000 Encounter for general adult medical examination without abnormal findings: Secondary | ICD-10-CM

## 2019-05-09 LAB — POCT URINALYSIS DIPSTICK
Bilirubin, UA: NEGATIVE
Glucose, UA: NEGATIVE
Ketones, UA: NEGATIVE
Leukocytes, UA: NEGATIVE
Nitrite, UA: NEGATIVE
Protein, UA: NEGATIVE
Spec Grav, UA: >=1.03 — AB (ref 1.010–1.025)
Urobilinogen, UA: 0.2 E.U./dL
pH, UA: 5 (ref 5.0–8.0)

## 2019-05-09 MED ORDER — GABAPENTIN 100 MG PO CAPS: 100.0000 mg | ORAL_CAPSULE | Freq: Every day | ORAL | 0 refills | Status: DC

## 2019-05-09 NOTE — Progress Notes (Signed)
Date:  05/09/2019   Name:  Timothy Scott   DOB:  [birth year]   MRN:  [id number]   Chief Complaint: Annual Exam (Wants to get flu shot later in the yeaer. PHQ9- 36) Timothy Scott is a 73 y.o. male who presents today for his Complete Annual Exam. He feels fairly well. He reports exercising walking. He reports he is sleeping poorly at times - takes trazodone at bedtime.  Colonoscopy - none Immunizations - up to date.  Influenza due  HPI  Depression/PTSD - he is followed by the [facility].  He takes several medications that are not on his list - bupropion and one other - but he does not take them regularly.  Globus sensation - He has had a full workup with no cause found.  He feels like it is worsening to the point that it limits his ability to exert himself and exercise.  He was prescribed gabapentin at one time but probably did not give it a good trial.    Review of Systems  Constitutional: Positive for fatigue. Negative for appetite change, chills, diaphoresis and unexpected weight change.  HENT: Positive for trouble swallowing. Negative for hearing loss, tinnitus and voice change.   Eyes: Negative for visual disturbance.  Respiratory: Positive for choking and shortness of breath. Negative for wheezing.   Cardiovascular: Negative for chest pain, palpitations and leg swelling.  Gastrointestinal: Negative for abdominal pain, blood in stool, constipation and diarrhea.  Genitourinary: Positive for difficulty urinating (nocturia x 4; incomplete emptying). Negative for dysuria and frequency.  Musculoskeletal: Negative for arthralgias, back pain and myalgias.  Skin: Negative for color change and rash.  Neurological: Negative for dizziness, syncope and headaches.  Hematological: Negative for adenopathy.  Psychiatric/Behavioral: Positive for dysphoric mood and sleep disturbance. The patient is nervous/anxious.     Patient Active Problem List   Diagnosis Date Noted  . Mood disturbance  02/21/2019  . Weight loss 02/21/2019  . Carotid artery plaque, left 07/09/2018  . Sick sinus syndrome ([place]) 03/09/2017  . Vertigo 12/12/2016  . Anosmia 05/05/2016  . Dysphagia 02/03/2016  . Tobacco use disorder, moderate, in sustained remission 03/11/2015  . Lumbar disc herniation with radiculopathy 03/11/2015  . Gonalgia 03/10/2015  . DDD (degenerative disc disease), cervical 03/10/2015  . Difficult or painful urination 03/10/2015  . Degenerative arthritis of finger 03/10/2015  . Post-traumatic stress disorder 03/10/2015  . Lattice degeneration 03/10/2015  . Leg varices 03/10/2015  . Sinus bradycardia 04/17/2014    No Known Allergies  Past Surgical History:  Procedure Laterality Date  . CATARACT EXTRACTION Right 2015  . CATARACT EXTRACTION W/PHACO Left 10/21/2015   Procedure: CATARACT EXTRACTION PHACO AND INTRAOCULAR LENS PLACEMENT (IOC);  Surgeon: Leandrew Koyanagi, MD;  Location: [place];  Service: Ophthalmology;  Laterality: Left;  . COLONOSCOPY  2014   benign polyps  . CYSTOSCOPY    . ESOPHAGEAL MANOMETRY N/A 08/03/2016   Procedure: ESOPHAGEAL MANOMETRY (EM);  Surgeon: Mauri Pole, MD;  Location: [facility];  Service: Endoscopy;  Laterality: N/A;  . ESOPHAGOGASTRODUODENOSCOPY (EGD) WITH PROPOFOL N/A 02/17/2016   with esoph dilatation  . TONSILLECTOMY      Social History   Tobacco Use  . Smoking status: Former Smoker    Packs/day: 0.50    Years: 20.00    Pack years: 10.00    Types: Cigarettes, Pipe, Cigars    Quit date: 04/05/1986    Years since quitting: 33.1  . Smokeless tobacco: Never Used  . Tobacco comment:  smoking cessation materials not required  Substance Use Topics  . Alcohol use: Yes    Alcohol/week: 14.0 standard drinks    Types: 14 Standard drinks or equivalent per week    Comment: socially  . Drug use: No     Medication list has been reviewed and updated.  Current Meds  Medication Sig  . atorvastatin (LIPITOR) 10 MG  tablet Take 1 tablet (10 mg total) by mouth daily.  Marland Kitchen azelastine (ASTELIN) 0.1 % nasal spray   . baclofen (LIORESAL) 10 MG tablet Take 5 mg by mouth 3 (three) times daily.   . Calcium Carbonate-Vit D-Min (CALCIUM 1200 PO) Take 1 mg by mouth.  . finasteride (PROSCAR) 5 MG tablet Take 5 mg by mouth daily.  . fluticasone (FLONASE) 50 MCG/ACT nasal spray Place 2 sprays into both nostrils daily. Reported on 03/01/2016  . glucosamine-chondroitin 500-400 MG tablet Take 1 tablet by mouth daily.  . Loperamide HCl (IMODIUM A-D PO) Take by mouth as needed.   . naproxen (NAPROSYN) 500 MG tablet Take 500 mg by mouth 2 (two) times daily with a meal.  . omeprazole (PRILOSEC) 40 MG capsule Take 40 mg by mouth daily.   . Probiotic Product (PROBIOTIC DAILY PO) Take by mouth.  . Saw Palmetto 450 MG CAPS Take by mouth daily.  . tamsulosin (FLOMAX) 0.4 MG CAPS capsule Take 0.4 mg by mouth.  . traZODone (DESYREL) 50 MG tablet Take 50 mg by mouth at bedtime.    PHQ 2/9 Scores 05/09/2019 04/15/2019 04/01/2019 02/21/2019  PHQ - 2 Score 4 2 4 5   PHQ- 9 Score 11 10 11 9     BP Readings from Last 3 Encounters:  05/09/19 112/68  04/15/19 122/66  04/01/19 (!) 96/58    Physical Exam Vitals signs and nursing note reviewed.  Constitutional:      Appearance: Normal appearance. He is well-developed.  HENT:     Head: Normocephalic.     Right Ear: Tympanic membrane, ear canal and external ear normal.     Left Ear: Tympanic membrane, ear canal and external ear normal.     Nose: Nose normal.     Mouth/Throat:     Pharynx: Uvula midline.  Eyes:     Conjunctiva/sclera: Conjunctivae normal.     Pupils: Pupils are equal, round, and reactive to light.  Neck:     Musculoskeletal: Normal range of motion and neck supple. No neck rigidity, spinous process tenderness or muscular tenderness.     Thyroid: No thyroid mass or thyromegaly.     Vascular: No carotid bruit.     Trachea: Trachea and phonation normal.  Cardiovascular:      Rate and Rhythm: Regular rhythm. Bradycardia present.  No extrasystoles are present.    Pulses:          Radial pulses are 2+ on the right side and 2+ on the left side.       Dorsalis pedis pulses are 1+ on the right side and 1+ on the left side.       Posterior tibial pulses are 1+ on the right side and 1+ on the left side.     Heart sounds: Normal heart sounds.  Pulmonary:     Effort: Pulmonary effort is normal.     Breath sounds: Normal breath sounds. No wheezing.  Chest:     Breasts:        Right: No mass.        Left: No mass.  Abdominal:  General: Bowel sounds are normal.     Palpations: Abdomen is soft.     Tenderness: There is no abdominal tenderness.  Musculoskeletal: Normal range of motion.  Lymphadenopathy:     Cervical: No cervical adenopathy.  Skin:    General: Skin is warm and dry.  Neurological:     Mental Status: He is alert and oriented to person, place, and time.     Deep Tendon Reflexes: Reflexes are normal and symmetric.  Psychiatric:        Attention and Perception: Attention normal.        Mood and Affect: Mood is anxious.        Speech: Speech normal.        Behavior: Behavior normal.        Thought Content: Thought content normal.        Cognition and Memory: Cognition normal.        Judgment: Judgment normal.     Wt Readings from Last 3 Encounters:  05/09/19 162 lb (73.5 kg)  04/15/19 163 lb 12.8 oz (74.3 kg)  04/01/19 161 lb 12.8 oz (73.4 kg)    BP 112/68   Pulse 63   Ht 5\' 11"  (1.803 m)   Wt 162 lb (73.5 kg)   SpO2 96%   BMI 22.59 kg/m   Assessment and Plan: 1. Annual physical exam Normal exam Continue healthy diet, exercise as able Follow up with Cardiology as previously discussed regarding SSS - CBC with Differential/Platelet - Comprehensive metabolic panel - TSH + free T4 - POCT urinalysis dipstick  2. Colon cancer screening Due for 10 yr colonoscopy He is seeing Dr. Allen Norris for consultation for EGD later this month - he  will discuss colonoscopy with Dr. Allen Norris at that time  3. Carotid artery plaque, left [place] 09/2018 - no hemodynamically significant stenosis Continue statin therapy with goal LDL < 70 - Lipid panel  4. Need for hepatitis C screening test - Hepatitis C antibody  5. Prostate cancer screening Seen by Urology for microscopic hematuria but no PSA was done - PSA  6. Cervicalgia Persistent neck pain and globus sensation possibly related to nerve root impingement Will try gabapentin again - urged pt to take for at least one month unless side effects occur Repeat CT is reasonable given worsening symptoms - CT CERVICAL SPINE WO CONTRAST; Future - gabapentin (NEURONTIN) 100 MG capsule; Take 1-3 capsules (100-300 mg total) by mouth at bedtime.  Dispense: 90 capsule; Refill: 0  7. Post-traumatic stress disorder Followed by Psych at [facility] I suggest that he take his prescribed medications daily - he will call back with the precise medication/dose   Partially dictated using Editor, commissioning. Any errors are unintentional.  Halina Maidens, MD [facility]  05/09/2019

## 2019-05-09 NOTE — Patient Instructions (Signed)
Start taking gabapentin 100 mg at bedtime x 3 days then increase to 200 mg at bedtime x 3 days then 300 mg at bedtime.  Keep taking for at least a month to see if there is benefit.

## 2019-05-10 LAB — LIPID PANEL
Chol/HDL Ratio: 2.5 ratio (ref 0.0–5.0)
Cholesterol, Total: 149 mg/dL (ref 100–199)
HDL: 60 mg/dL (ref >39)
LDL Chol Calc (NIH): 61 mg/dL (ref 0–99)
Triglycerides: 166 mg/dL — ABNORMAL HIGH (ref 0–149)
VLDL Cholesterol Cal: 28 mg/dL (ref 5–40)

## 2019-05-10 LAB — CBC WITH DIFFERENTIAL/PLATELET
Basophils Absolute: 0.1 10*3/uL (ref 0.0–0.2)
Basos: 1 %
EOS (ABSOLUTE): 0.1 10*3/uL (ref 0.0–0.4)
Eos: 2 %
Hematocrit: 42.3 % (ref 37.5–51.0)
Hemoglobin: 14.2 g/dL (ref 13.0–17.7)
Immature Grans (Abs): 0 10*3/uL (ref 0.0–0.1)
Immature Granulocytes: 0 %
Lymphocytes Absolute: 1.1 10*3/uL (ref 0.7–3.1)
Lymphs: 12 %
MCH: 32.9 pg (ref 26.6–33.0)
MCHC: 33.6 g/dL (ref 31.5–35.7)
MCV: 98 fL — ABNORMAL HIGH (ref 79–97)
Monocytes Absolute: 0.8 10*3/uL (ref 0.1–0.9)
Monocytes: 9 %
Neutrophils Absolute: 6.8 10*3/uL (ref 1.4–7.0)
Neutrophils: 76 %
Platelets: 320 10*3/uL (ref 150–450)
RBC: 4.31 x10E6/uL (ref 4.14–5.80)
RDW: 12 % (ref 11.6–15.4)
WBC: 8.8 10*3/uL (ref 3.4–10.8)

## 2019-05-10 LAB — COMPREHENSIVE METABOLIC PANEL
ALT: 19 IU/L (ref 0–44)
AST: 15 IU/L (ref 0–40)
Albumin/Globulin Ratio: 2.1 (ref 1.2–2.2)
Albumin: 4.5 g/dL (ref 3.7–4.7)
Alkaline Phosphatase: 46 IU/L (ref 39–117)
BUN/Creatinine Ratio: 20 (ref 10–24)
BUN: 17 mg/dL (ref 8–27)
Bilirubin Total: 0.4 mg/dL (ref 0.0–1.2)
CO2: 25 mmol/L (ref 20–29)
Calcium: 9.3 mg/dL (ref 8.6–10.2)
Chloride: 104 mmol/L (ref 96–106)
Creatinine, Ser: 0.83 mg/dL (ref 0.76–1.27)
GFR calc Af Amer: 101 mL/min/{1.73_m2} (ref >59)
GFR calc non Af Amer: 87 mL/min/{1.73_m2} (ref >59)
Globulin, Total: 2.1 g/dL (ref 1.5–4.5)
Glucose: 99 mg/dL (ref 65–99)
Potassium: 4.4 mmol/L (ref 3.5–5.2)
Sodium: 143 mmol/L (ref 134–144)
Total Protein: 6.6 g/dL (ref 6.0–8.5)

## 2019-05-10 LAB — HEPATITIS C ANTIBODY: Hep C Virus Ab: <0.1 s/co ratio (ref 0.0–0.9)

## 2019-05-10 LAB — TSH+FREE T4
Free T4: 0.98 ng/dL (ref 0.82–1.77)
TSH: 1.71 u[IU]/mL (ref 0.450–4.500)

## 2019-05-10 LAB — PSA: Prostate Specific Ag, Serum: 0.2 ng/mL (ref 0.0–4.0)

## 2019-05-16 ENCOUNTER — Other Ambulatory Visit: Payer: Self-pay

## 2019-05-16 ENCOUNTER — Ambulatory Visit: Admission: RE | Admit: 2019-05-16 | Payer: PPO | Source: Ambulatory Visit

## 2019-05-16 ENCOUNTER — Ambulatory Visit
Admission: RE | Admit: 2019-05-16 | Discharge: 2019-05-16 | Disposition: A | Discharge status: Home / Self Care | Payer: PPO | Source: Ambulatory Visit | Attending: Internal Medicine | Admitting: Internal Medicine

## 2019-05-16 DIAGNOSIS — M542 Cervicalgia: Secondary | ICD-10-CM | POA: Insufficient documentation

## 2019-05-31 ENCOUNTER — Other Ambulatory Visit: Payer: Self-pay

## 2019-05-31 ENCOUNTER — Observation Stay
Admission: EM | Admit: 2019-05-31 | Discharge: 2019-06-01 | Disposition: A | Discharge status: Home / Self Care | Payer: PPO | Attending: Internal Medicine | Admitting: Internal Medicine

## 2019-05-31 ENCOUNTER — Emergency Department: Payer: PPO

## 2019-05-31 ENCOUNTER — Observation Stay: Payer: PPO

## 2019-05-31 DIAGNOSIS — Z87891 Personal history of nicotine dependence: Secondary | ICD-10-CM | POA: Insufficient documentation

## 2019-05-31 DIAGNOSIS — K219 Gastro-esophageal reflux disease without esophagitis: Secondary | ICD-10-CM | POA: Diagnosis not present

## 2019-05-31 DIAGNOSIS — Z8249 Family history of ischemic heart disease and other diseases of the circulatory system: Secondary | ICD-10-CM | POA: Diagnosis not present

## 2019-05-31 DIAGNOSIS — Z791 Long term (current) use of non-steroidal anti-inflammatories (NSAID): Secondary | ICD-10-CM | POA: Insufficient documentation

## 2019-05-31 DIAGNOSIS — J439 Emphysema, unspecified: Secondary | ICD-10-CM | POA: Diagnosis not present

## 2019-05-31 DIAGNOSIS — Z20828 Contact with and (suspected) exposure to other viral communicable diseases: Secondary | ICD-10-CM | POA: Insufficient documentation

## 2019-05-31 DIAGNOSIS — M5030 Other cervical disc degeneration, unspecified cervical region: Secondary | ICD-10-CM | POA: Insufficient documentation

## 2019-05-31 DIAGNOSIS — I083 Combined rheumatic disorders of mitral, aortic and tricuspid valves: Secondary | ICD-10-CM | POA: Diagnosis not present

## 2019-05-31 DIAGNOSIS — Z79899 Other long term (current) drug therapy: Secondary | ICD-10-CM | POA: Insufficient documentation

## 2019-05-31 DIAGNOSIS — Z7902 Long term (current) use of antithrombotics/antiplatelets: Secondary | ICD-10-CM | POA: Insufficient documentation

## 2019-05-31 DIAGNOSIS — M2578 Osteophyte, vertebrae: Secondary | ICD-10-CM | POA: Diagnosis not present

## 2019-05-31 DIAGNOSIS — M4802 Spinal stenosis, cervical region: Secondary | ICD-10-CM | POA: Diagnosis not present

## 2019-05-31 DIAGNOSIS — R001 Bradycardia, unspecified: Secondary | ICD-10-CM | POA: Insufficient documentation

## 2019-05-31 DIAGNOSIS — H538 Other visual disturbances: Secondary | ICD-10-CM | POA: Diagnosis not present

## 2019-05-31 DIAGNOSIS — R200 Anesthesia of skin: Secondary | ICD-10-CM | POA: Diagnosis not present

## 2019-05-31 DIAGNOSIS — Z7951 Long term (current) use of inhaled steroids: Secondary | ICD-10-CM | POA: Insufficient documentation

## 2019-05-31 DIAGNOSIS — I6522 Occlusion and stenosis of left carotid artery: Secondary | ICD-10-CM | POA: Diagnosis not present

## 2019-05-31 DIAGNOSIS — G459 Transient cerebral ischemic attack, unspecified: Principal | ICD-10-CM | POA: Insufficient documentation

## 2019-05-31 DIAGNOSIS — R42 Dizziness and giddiness: Secondary | ICD-10-CM | POA: Diagnosis not present

## 2019-05-31 DIAGNOSIS — Z03818 Encounter for observation for suspected exposure to other biological agents ruled out: Secondary | ICD-10-CM | POA: Diagnosis not present

## 2019-05-31 DIAGNOSIS — M47812 Spondylosis without myelopathy or radiculopathy, cervical region: Secondary | ICD-10-CM | POA: Diagnosis not present

## 2019-05-31 DIAGNOSIS — R29818 Other symptoms and signs involving the nervous system: Secondary | ICD-10-CM | POA: Diagnosis not present

## 2019-05-31 DIAGNOSIS — E785 Hyperlipidemia, unspecified: Secondary | ICD-10-CM | POA: Insufficient documentation

## 2019-05-31 LAB — COMPREHENSIVE METABOLIC PANEL
ALT: 22 U/L (ref 0–44)
AST: 23 U/L (ref 15–41)
Albumin: 4.4 g/dL (ref 3.5–5.0)
Alkaline Phosphatase: 43 U/L (ref 38–126)
Anion gap: 7 (ref 5–15)
BUN: 17 mg/dL (ref 8–23)
CO2: 29 mmol/L (ref 22–32)
Calcium: 9.3 mg/dL (ref 8.9–10.3)
Chloride: 104 mmol/L (ref 98–111)
Creatinine, Ser: 0.73 mg/dL (ref 0.61–1.24)
GFR calc Af Amer: >60 mL/min (ref >60)
GFR calc non Af Amer: >60 mL/min (ref >60)
Glucose, Bld: 100 mg/dL — ABNORMAL HIGH (ref 70–99)
Potassium: 4 mmol/L (ref 3.5–5.1)
Sodium: 140 mmol/L (ref 135–145)
Total Bilirubin: 0.9 mg/dL (ref 0.3–1.2)
Total Protein: 6.9 g/dL (ref 6.5–8.1)

## 2019-05-31 LAB — PROTIME-INR
INR: 1.1 (ref 0.8–1.2)
Prothrombin Time: 13.6 seconds (ref 11.4–15.2)

## 2019-05-31 LAB — DIFFERENTIAL
Abs Immature Granulocytes: 0.03 10*3/uL (ref 0.00–0.07)
Basophils Absolute: 0.1 10*3/uL (ref 0.0–0.1)
Basophils Relative: 1 %
Eosinophils Absolute: 0.1 10*3/uL (ref 0.0–0.5)
Eosinophils Relative: 2 %
Immature Granulocytes: 0 %
Lymphocytes Relative: 18 %
Lymphs Abs: 1.3 10*3/uL (ref 0.7–4.0)
Monocytes Absolute: 0.7 10*3/uL (ref 0.1–1.0)
Monocytes Relative: 10 %
Neutro Abs: 5 10*3/uL (ref 1.7–7.7)
Neutrophils Relative %: 69 %

## 2019-05-31 LAB — APTT: aPTT: 37 seconds — ABNORMAL HIGH (ref 24–36)

## 2019-05-31 LAB — GLUCOSE, CAPILLARY: Glucose-Capillary: 96 mg/dL (ref 70–99)

## 2019-05-31 LAB — CBC
HCT: 41.5 % (ref 39.0–52.0)
Hemoglobin: 14.1 g/dL (ref 13.0–17.0)
MCH: 32.9 pg (ref 26.0–34.0)
MCHC: 34 g/dL (ref 30.0–36.0)
MCV: 97 fL (ref 80.0–100.0)
Platelets: 291 10*3/uL (ref 150–400)
RBC: 4.28 MIL/uL (ref 4.22–5.81)
RDW: 12.2 % (ref 11.5–15.5)
WBC: 7.2 10*3/uL (ref 4.0–10.5)
nRBC: 0 % (ref 0.0–0.2)

## 2019-05-31 MED ORDER — ACETAMINOPHEN 650 MG RE SUPP: 650.0000 mg | RECTAL | Status: DC | PRN

## 2019-05-31 MED ORDER — BISACODYL 5 MG PO TBEC: 5.0000 mg | DELAYED_RELEASE_TABLET | Freq: Every day | ORAL | Status: DC | PRN

## 2019-05-31 MED ORDER — HYDROCODONE-ACETAMINOPHEN 5-325 MG PO TABS: 1.0000 | ORAL_TABLET | ORAL | Status: DC | PRN

## 2019-05-31 MED ORDER — ACETAMINOPHEN 325 MG PO TABS: 650.0000 mg | ORAL_TABLET | Freq: Four times a day (QID) | ORAL | Status: DC | PRN

## 2019-05-31 MED ORDER — ONDANSETRON HCL 4 MG PO TABS: 4.0000 mg | ORAL_TABLET | Freq: Four times a day (QID) | ORAL | Status: DC | PRN

## 2019-05-31 MED ORDER — ACETAMINOPHEN 160 MG/5ML PO SOLN
650.0000 mg | ORAL | Status: DC | PRN
  Filled 2019-05-31: qty 20.3

## 2019-05-31 MED ORDER — IOHEXOL 350 MG/ML SOLN
75.0000 mL | Freq: Once | INTRAVENOUS | Status: AC | PRN
  Administered 2019-05-31: 15:00:00 75 mL via INTRAVENOUS

## 2019-05-31 MED ORDER — CLOPIDOGREL BISULFATE 75 MG PO TABS
75.0000 mg | ORAL_TABLET | Freq: Every day | ORAL | Status: DC
  Administered 2019-06-01: 10:00:00 75 mg via ORAL
  Filled 2019-05-31: qty 1

## 2019-05-31 MED ORDER — ACETAMINOPHEN 650 MG RE SUPP: 650.0000 mg | Freq: Four times a day (QID) | RECTAL | Status: DC | PRN

## 2019-05-31 MED ORDER — ENOXAPARIN SODIUM 40 MG/0.4ML ~~LOC~~ SOLN: 30.0000 mg | SUBCUTANEOUS | Status: DC

## 2019-05-31 MED ORDER — SENNOSIDES-DOCUSATE SODIUM 8.6-50 MG PO TABS: 1.0000 | ORAL_TABLET | Freq: Every evening | ORAL | Status: DC | PRN

## 2019-05-31 MED ORDER — ASPIRIN EC 81 MG PO TBEC
81.0000 mg | DELAYED_RELEASE_TABLET | Freq: Every day | ORAL | Status: DC
  Administered 2019-06-01: 10:00:00 81 mg via ORAL
  Filled 2019-05-31: qty 1

## 2019-05-31 MED ORDER — ONDANSETRON HCL 4 MG/2ML IJ SOLN: 4.0000 mg | Freq: Four times a day (QID) | INTRAMUSCULAR | Status: DC | PRN

## 2019-05-31 MED ORDER — STROKE: EARLY STAGES OF RECOVERY BOOK
Freq: Once | Status: AC
  Administered 2019-05-31: 17:00:00

## 2019-05-31 MED ORDER — DOCUSATE SODIUM 100 MG PO CAPS
100.0000 mg | ORAL_CAPSULE | Freq: Two times a day (BID) | ORAL | Status: DC
  Filled 2019-05-31 (×2): qty 1

## 2019-05-31 MED ORDER — ENOXAPARIN SODIUM 40 MG/0.4ML ~~LOC~~ SOLN: 40.0000 mg | SUBCUTANEOUS | Status: DC

## 2019-05-31 MED ORDER — ACETAMINOPHEN 325 MG PO TABS: 650.0000 mg | ORAL_TABLET | ORAL | Status: DC | PRN

## 2019-05-31 MED ORDER — SODIUM CHLORIDE 0.9% FLUSH
3.0000 mL | Freq: Once | INTRAVENOUS | Status: AC
  Administered 2019-05-31: 3 mL via INTRAVENOUS

## 2019-05-31 MED ORDER — TRAZODONE HCL 50 MG PO TABS: 25.0000 mg | ORAL_TABLET | Freq: Every evening | ORAL | Status: DC | PRN

## 2019-05-31 NOTE — ED Notes (Signed)
Pt ambulated around room at this time with assistance from this RN and Claiborne Billings, Therapist, sports.  Pt with steady gait, denies pain or SOB.  Will continue to monitor.

## 2019-05-31 NOTE — Consult Note (Signed)
Referring Physician: Cinda Quest    Chief Complaint: Left arm weakness, change in vision  HPI: Timothy Scott is an 73 y.o. male with a history of HLD who reports that on yesterday he had an episode of left arm numbness that resolved spontaneously.  This morning he awakened at baseline.  At 1100 today noted his left arm numbness returned.  Also felt that he was unable to see out of his left eye.  Reports not covering his eyes to be sure it was one eye.  Symptoms are now resolved.  Initial NIHSS of 0.    Date last known well: Date: 05/31/2019 Time last known well: Time: 11:00 tPA Given: No: Resolution of symptoms  Past Medical History:  Diagnosis Date  . Benign neoplasm of colon   . Chronic airway obstruction, not elsewhere classified   . DDD (degenerative disc disease), cervical   . Degeneration of cervical intervertebral disc   . Dysphagia   . Dysuria   . Hyperlipidemia   . 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 Lattice degeneration of peripheral retina   . Leg varices 03/10/2015  . Osteoarthrosis, unspecified whether generalized or localized, hand   . Posttraumatic stress disorder   . Sinus bradycardia 04/17/2014  . Varicose veins of bilateral lower extremities with other complications   . Wears dentures    partial upper and lower    Past Surgical History:  Procedure Laterality Date  . CATARACT EXTRACTION Right 2015  . CATARACT EXTRACTION W/PHACO Left 10/21/2015   Procedure: CATARACT EXTRACTION PHACO AND INTRAOCULAR LENS PLACEMENT (IOC);  Surgeon: Leandrew Koyanagi, MD;  Location: [place];  Service: Ophthalmology;  Laterality: Left;  . CATARACT EXTRACTION, BILATERAL  2016  . COLONOSCOPY  2014   benign polyps  . CYSTOSCOPY    . ESOPHAGEAL MANOMETRY N/A 08/03/2016   Procedure: ESOPHAGEAL MANOMETRY (EM);  Surgeon: Mauri Pole, MD;  [facility];  Service: Endoscopy;  Laterality: N/A;  . ESOPHAGOGASTRODUODENOSCOPY (EGD) WITH  PROPOFOL N/A 02/17/2016   with esoph dilatation  . TONSILLECTOMY      Family History  Problem Relation Age of Onset  . Hypertension Mother   . Cancer Mother        breast  . Aneurysm Mother   . Healthy Sister   . Prostate cancer Neg Hx   . Kidney cancer Neg Hx   . Bladder Cancer Neg Hx    Social History:  reports that he quit smoking about 33 years ago. His smoking use included cigarettes, pipe, and cigars. He has a 10.00 pack-year smoking history. He has never used smokeless tobacco. He reports current alcohol use of about 14.0 standard drinks of alcohol per week. He reports that he does not use drugs.  Allergies: No Known Allergies  Medications: I have reviewed the patient's current medications. Prior to Admission medications   Medication Sig Start Date End Date Taking? Authorizing Provider  atorvastatin (LIPITOR) 10 MG tablet Take 1 tablet (10 mg total) by mouth daily. 09/28/18   Glean Hess, MD  azelastine (ASTELIN) 0.1 % nasal spray  11/18/15   [provider]  baclofen (LIORESAL) 10 MG tablet Take 5 mg by mouth 3 (three) times daily.  04/01/19   [provider]  Calcium Carbonate-Vit D-Min (CALCIUM 1200 PO) Take 1 mg by mouth.    [provider]  finasteride (PROSCAR) 5 MG tablet Take 5 mg by mouth daily.    [provider]  fluticasone (FLONASE) 50  MCG/ACT nasal spray Place 2 sprays into both nostrils daily. Reported on 03/01/2016    [provider]  gabapentin (NEURONTIN) 100 MG capsule Take 1-3 capsules (100-300 mg total) by mouth at bedtime. 05/09/19   Glean Hess, MD  glucosamine-chondroitin 500-400 MG tablet Take 1 tablet by mouth daily.    [provider]  Loperamide HCl (IMODIUM A-D PO) Take by mouth as needed.     [provider]  naproxen (NAPROSYN) 500 MG tablet Take 500 mg by mouth 2 (two) times daily with a meal.    [provider]  omeprazole (PRILOSEC) 40 MG capsule Take 40 mg by mouth  daily.  09/27/18   [provider]  Probiotic Product (PROBIOTIC DAILY PO) Take by mouth.    [provider]  Saw Palmetto 450 MG CAPS Take by mouth daily.    [provider]  tamsulosin (FLOMAX) 0.4 MG CAPS capsule Take 0.4 mg by mouth.    [provider]  traZODone (DESYREL) 50 MG tablet Take 50 mg by mouth at bedtime.    [provider]     ROS: History obtained from the patient  General ROS: negative for - chills, fatigue, fever, night sweats, weight gain or weight loss Psychological ROS: negative for - behavioral disorder, hallucinations, memory difficulties, mood swings or suicidal ideation Ophthalmic ROS: negative for - blurry vision, double vision, eye pain or loss of vision ENT ROS: negative for - epistaxis, nasal discharge, oral lesions, sore throat, tinnitus or vertigo Allergy and Immunology ROS: negative for - hives or itchy/watery eyes Hematological and Lymphatic ROS: negative for - bleeding problems, bruising or swollen lymph nodes Endocrine ROS: negative for - galactorrhea, hair pattern changes, polydipsia/polyuria or temperature intolerance Respiratory ROS: negative for - cough, hemoptysis, shortness of breath or wheezing Cardiovascular ROS: negative for - chest pain, dyspnea on exertion, edema or irregular heartbeat Gastrointestinal ROS: negative for - abdominal pain, diarrhea, hematemesis, nausea/vomiting or stool incontinence Genito-Urinary ROS: negative for - dysuria, hematuria, incontinence or urinary frequency/urgency Musculoskeletal ROS: negative for - joint swelling or muscular weakness Neurological ROS: as noted in HPI Dermatological ROS: negative for rash and skin lesion changes  Physical Examination: Blood pressure (!) 142/70, pulse (!) 41, temperature 98 F (36.7 C), temperature source Oral, resp. rate 12, height 5\' 11"  (1.803 m), weight 73.9 kg, SpO2 100 %.  HEENT-  Normocephalic, no lesions, without obvious  abnormality.  Normal external eye and conjunctiva.  Normal TM's bilaterally.  Normal auditory canals and external ears. Normal external nose, mucus membranes and septum.  Normal pharynx. Cardiovascular- S1, S2 normal, pulses palpable throughout   Lungs- chest clear, no wheezing, rales, normal symmetric air entry Abdomen- soft, non-tender; bowel sounds normal; no masses,  no organomegaly Extremities- no edema Lymph-no adenopathy palpable Musculoskeletal-no joint tenderness, deformity or swelling Skin-warm and dry, no hyperpigmentation, vitiligo, or suspicious lesions  Neurological Examination   Mental Status: Alert, oriented, thought content appropriate.  Speech fluent without evidence of aphasia.  Able to follow 3 step commands without difficulty. Cranial Nerves: II: Discs flat bilaterally; Visual fields grossly normal, pupils equal, round, reactive to light and accommodation III,IV, VI: ptosis not present, extra-ocular motions intact bilaterally V,VII: smile symmetric, facial light touch sensation normal bilaterally VIII: hearing normal bilaterally IX,X: gag reflex present XI: bilateral shoulder shrug XII: midline tongue extension Motor: Right : Upper extremity   5/5    Left:     Upper extremity   5/5  Lower extremity   5/5  Lower extremity   5/5 Tone and bulk:normal tone throughout; no atrophy noted Sensory: Pinprick and light touch intact throughout, bilaterally Deep Tendon Reflexes: Symmetric throughout Plantars: Right: mute   Left: mute Cerebellar: Normal finger-to-nose and normal heel-to-shin testing bilaterally Gait: not tested due to safety concerns    Laboratory Studies:  Basic Metabolic Panel: Recent Labs  Lab 05/31/19 1338  NA 140  K 4.0  CL 104  CO2 29  GLUCOSE 100*  BUN 17  CREATININE 0.73  CALCIUM 9.3    Liver Function Tests: Recent Labs  Lab 05/31/19 1338  AST 23  ALT 22  ALKPHOS 43  BILITOT 0.9  PROT 6.9  ALBUMIN 4.4   No results for  input(s): LIPASE, AMYLASE in the last 168 hours. No results for input(s): AMMONIA in the last 168 hours.  CBC: Recent Labs  Lab 05/31/19 1338  WBC 7.2  NEUTROABS 5.0  HGB 14.1  HCT 41.5  MCV 97.0  PLT 291    Cardiac Enzymes: No results for input(s): CKTOTAL, CKMB, CKMBINDEX, TROPONINI in the last 168 hours.  BNP: Invalid input(s): POCBNP  CBG: Recent Labs  Lab 05/31/19 1333  GLUCAP 96    Microbiology: Results for orders placed or performed in visit on 02/21/19  Novel Coronavirus, NAA ([facility])     Status: None   Collection Time: 02/21/19  1:09 PM  Result Value Ref Range Status   SARS-CoV-2, NAA Not Detected Not Detected Final    Comment: Testing was performed using the cobas(R) SARS-CoV-2 test. This test was developed and its performance characteristics determined by [employer]. This test has not been FDA cleared or approved. This test has been authorized by FDA under an Emergency Use Authorization (EUA). This test is only authorized for the duration of time the declaration that circumstances exist justifying the authorization of the emergency use of in vitro diagnostic tests for detection of SARS-CoV-2 virus and/or diagnosis of COVID-19 infection under section 564(b)(1) of the Act, 21 U.S.C. KA:123727), unless the authorization is terminated or revoked sooner. When diagnostic testing is negative, the possibility of a false negative result should be considered in the context of a patient's recent exposures and the presence of clinical signs and symptoms consistent with COVID-19. An individual without symptoms of COVID-19 and who is not shedding SARS-CoV-2 virus would expect to have a negati ve (not detected) result in this assay.     Coagulation Studies: Recent Labs    05/31/19 1338  LABPROT 13.6  INR 1.1    Urinalysis: No results for input(s): COLORURINE, LABSPEC, PHURINE, GLUCOSEU, HGBUR, BILIRUBINUR, KETONESUR, PROTEINUR, UROBILINOGEN,  NITRITE, LEUKOCYTESUR in the last 168 hours.  Invalid input(s): APPERANCEUR  Lipid Panel:    Component Value Date/Time   CHOL 149 05/09/2019 1032   TRIG 166 (H) 05/09/2019 1032   HDL 60 05/09/2019 1032   CHOLHDL 2.5 05/09/2019 1032   LDLCALC 84 03/26/2018 1156    HgbA1C:  Lab Results  Component Value Date   HGBA1C 5.5 04/05/2016    Urine Drug Screen:  No results found for: LABOPIA, COCAINSCRNUR, LABBENZ, AMPHETMU, THCU, LABBARB  Alcohol Level: No results for input(s): ETH in the last 168 hours.   Imaging: Ct Head Code Stroke Wo Contrast  Result Date: 05/31/2019 CLINICAL DATA:  Code stroke. Dizziness and left arm numbness. Left eye blurred vision. EXAM: CT HEAD WITHOUT CONTRAST TECHNIQUE: Contiguous axial images were obtained from the base of the skull through the vertex without intravenous contrast. COMPARISON:  MRI head 03/18/2016 FINDINGS: Brain:  Ventricle size and cerebral volume normal for age. Mild patchy white matter hyperintensity in the parietal lobes bilaterally similar to the prior MRI. Negative for acute infarct, hemorrhage, mass Vascular: Negative for hyperdense vessel Skull: Negative Sinuses/Orbits: Negative Other: None ASPECTS ([place] Stroke Program Early CT Score) - Ganglionic level infarction (caudate, lentiform nuclei, internal capsule, insula, M1-M3 cortex): 7 - Supraganglionic infarction (M4-M6 cortex): 3 Total score (0-10 with 10 being normal): 10 IMPRESSION: 1. No acute intracranial abnormality 2. ASPECTS is 10 3. These results were called by telephone at the time of interpretation on 05/31/2019 at 1:46 pm to provider [facility] , who verbally acknowledged these results. Electronically Signed   By: Franchot Gallo M.D.   On: 05/31/2019 13:47    Assessment: 73 y.o. male presenting with transient episodes of left arm numbness, today associated with left visual field loss.  Concerning for crescendo TIA.  Head CT reviewed and shows no acute changes.  Due to concern for  posterior circulation, CTA recommended as well.  CTA shows no evidence of hemodynamically significant stenosis.  Further TIA work up recommended.  Patient on no antiplatelet therapy prior to presentation.    Stroke Risk Factors - hyperlipidemia  Plan: 1. HgbA1c, fasting lipid panel 2. MRI  of the brain without contrast 3. PT consult, OT consult, Speech consult 4. Echocardiogram 5. Prophylactic therapy-Dual antiplatelet therapy with ASA 81mg  and Plavix 75mg  for three weeks with change to ASA 81mg  alone as monotherapy after that time. 6. NPO until RN stroke swallow screen 7. Telemetry monitoring 8. Frequent neuro checks   Timothy Goodell, MD Neurology [phone] 05/31/2019, 3:03 PM

## 2019-05-31 NOTE — ED Provider Notes (Signed)
[facility] Emergency Department Provider Note   ____________________________________________   First MD Initiated Contact with Patient 05/31/19 1341     (approximate)  I have reviewed the triage vital signs and the nursing notes.   HISTORY  Chief Complaint Blurred Vision    HPI Timothy Scott is a 73 y.o. male patient reported prior to bed time last night his left arm got numb and then it went away about 11:00 this morning his left eye became very blurry and he felt like he was going to pass out.  Right now he is feeling a lot better although he still has a headache and some dizziness.  He has no focal neurological findings at this point.         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    Lattice degeneration of peripheral retina    Leg varices 03/10/2015   Osteoarthrosis, unspecified whether generalized or localized, hand    Posttraumatic stress disorder    Sinus bradycardia 04/17/2014   Varicose veins of bilateral lower extremities with other complications    Wears dentures    partial upper and lower    Patient Active Problem List   Diagnosis Date Noted   Mood disturbance 02/21/2019   Weight loss 02/21/2019   Carotid artery plaque, left 07/09/2018   Sick sinus syndrome ([place]) 03/09/2017   Vertigo 12/12/2016   Anosmia 05/05/2016   Dysphagia 02/03/2016   Tobacco use disorder, moderate, in sustained remission 03/11/2015   Lumbar disc herniation with radiculopathy 03/11/2015   Gonalgia 03/10/2015   DDD (degenerative disc disease), cervical 03/10/2015   Difficult or painful urination 03/10/2015   Degenerative arthritis of finger  03/10/2015   Post-traumatic stress disorder 03/10/2015   Lattice degeneration 03/10/2015   Leg varices 03/10/2015   Sinus bradycardia 04/17/2014    Past Surgical History:  Procedure Laterality Date   CATARACT EXTRACTION Right 2015   CATARACT EXTRACTION W/PHACO Left 10/21/2015   Procedure: CATARACT EXTRACTION PHACO AND INTRAOCULAR LENS PLACEMENT ([place]);  Surgeon: Leandrew Koyanagi, MD;  Location: [facility];  Service: Ophthalmology;  Laterality: Left;   CATARACT EXTRACTION, BILATERAL  2016   COLONOSCOPY  2014   benign polyps   CYSTOSCOPY     ESOPHAGEAL MANOMETRY N/A 08/03/2016   Procedure: ESOPHAGEAL MANOMETRY (EM);  Surgeon: Mauri Pole, MD;  Location: [facility];  Service: Endoscopy;  Laterality: N/A;   ESOPHAGOGASTRODUODENOSCOPY (EGD) WITH PROPOFOL N/A 02/17/2016   with esoph dilatation   TONSILLECTOMY      Prior to Admission medications   Medication Sig Start Date End Date Taking? Authorizing Provider  atorvastatin (LIPITOR) 10 MG tablet Take 1 tablet (10 mg total) by mouth daily. 09/28/18   Glean Hess, MD  azelastine (ASTELIN) 0.1 % nasal spray  11/18/15   [provider]  baclofen (LIORESAL) 10 MG tablet Take 5 mg by mouth 3 (three) times daily.  04/01/19   [provider]  Calcium Carbonate-Vit D-Min (CALCIUM 1200 PO) Take 1 mg by mouth.    [provider]  finasteride (PROSCAR) 5 MG tablet Take 5 mg by mouth daily.    [provider]  fluticasone (FLONASE) 50 MCG/ACT nasal  spray Place 2 sprays into both nostrils daily. Reported on 03/01/2016    [provider]  gabapentin (NEURONTIN) 100 MG capsule Take 1-3 capsules (100-300 mg total) by mouth at bedtime. 05/09/19   Glean Hess, MD  glucosamine-chondroitin 500-400 MG tablet Take 1 tablet by mouth daily.    [provider]  Loperamide HCl (IMODIUM A-D PO) Take by mouth as needed.     [provider]  naproxen (NAPROSYN) 500 MG  tablet Take 500 mg by mouth 2 (two) times daily with a meal.    [provider]  omeprazole (PRILOSEC) 40 MG capsule Take 40 mg by mouth daily.  09/27/18   [provider]  Probiotic Product (PROBIOTIC DAILY PO) Take by mouth.    [provider]  Saw Palmetto 450 MG CAPS Take by mouth daily.    [provider]  tamsulosin (FLOMAX) 0.4 MG CAPS capsule Take 0.4 mg by mouth.    [provider]  traZODone (DESYREL) 50 MG tablet Take 50 mg by mouth at bedtime.    [provider]    Allergies Patient has no known allergies.  Family History  Problem Relation Age of Onset   Hypertension Mother    Cancer Mother        breast   Aneurysm Mother    Healthy Sister    Prostate cancer Neg Hx    Kidney cancer Neg Hx    Bladder Cancer Neg Hx     Social History Social History   Tobacco Use   Smoking status: Former Smoker    Packs/day: 0.50    Years: 20.00    Pack years: 10.00    Types: Cigarettes, Pipe, Cigars    Quit date: 04/05/1986    Years since quitting: 33.1   Smokeless tobacco: Never Used   Tobacco comment: smoking cessation materials not required  Substance Use Topics   Alcohol use: Yes    Alcohol/week: 14.0 standard drinks    Types: 14 Standard drinks or equivalent per week    Comment: socially   Drug use: No    Review of Systems  Constitutional: No fever/chills Eyes: No visual changes. ENT: No sore throat. Cardiovascular: Denies chest pain. Respiratory: Denies shortness of breath. Gastrointestinal: No abdominal pain.  No nausea, no vomiting.  No diarrhea.  No constipation. Genitourinary: Negative for dysuria. Musculoskeletal: Negative for back pain. Skin: Negative for rash. Neurological: Negative for headaches, focal weakness    ____________________________________________   PHYSICAL EXAM:  VITAL SIGNS: ED Triage Vitals  Enc Vitals Group     BP 05/31/19 1400 (!) 146/82     Pulse Rate 05/31/19 1400  (!) 50     Resp 05/31/19 1400 18     Temp 05/31/19 1403 98 F (36.7 C)     Temp Source 05/31/19 1403 Oral     SpO2 05/31/19 1400 98 %     Weight 05/31/19 1404 163 lb (73.9 kg)     Height 05/31/19 1404 5\' 11"  (1.803 m)     Head Circumference --      Peak Flow --      Pain Score 05/31/19 1404 0     Pain Loc --      Pain Edu? --      Excl. in [place]? --     Constitutional: Alert and oriented. Well appearing and in no acute distress. Eyes: Conjunctivae are normal. PERRL. EOMI. Head: Atraumatic. Nose: No congestion/rhinnorhea. Mouth/Throat: Mucous membranes are moist.  Oropharynx non-erythematous. Neck:  No stridor.   Cardiovascular: Normal rate, regular rhythm. Grossly normal heart sounds.  Good peripheral circulation. Respiratory: Normal respiratory effort.  No retractions. Lungs CTAB. Gastrointestinal: Soft and nontender. No distention. No abdominal bruits. No CVA tenderness. Musculoskeletal: No lower extremity tenderness nor edema.   Neurologic:  Normal speech and language. No gross focal neurologic deficits are appreciated.  Skin:  Skin is warm, dry and intact. No rash noted.   ____________________________________________   LABS (all labs ordered are listed, but only abnormal results are displayed)  Labs Reviewed  APTT - Abnormal; Notable for the following components:      Result Value   aPTT 37 (*)    All other components within normal limits  COMPREHENSIVE METABOLIC PANEL - Abnormal; Notable for the following components:   Glucose, Bld 100 (*)    All other components within normal limits  SARS CORONAVIRUS 2 (TAT 6-24 HRS)  GLUCOSE, CAPILLARY  PROTIME-INR  CBC  DIFFERENTIAL  CBG MONITORING, ED   ____________________________________________  EKG   ____________________________________________  RADIOLOGY  ED MD interpretation: CT of the head read by radiology reviewed by me is negative CT angios read by radiology of both the head and neck is negative.  Official  radiology report(s): Ct Angio Head W Or Wo Contrast  Result Date: 05/31/2019 CLINICAL DATA:  Left arm numbness. Blurred vision. Rule out subarachnoid hemorrhage. EXAM: CT ANGIOGRAPHY HEAD AND NECK TECHNIQUE: Multidetector CT imaging of the head and neck was performed using the standard protocol during bolus administration of intravenous contrast. Multiplanar CT image reconstructions and MIPs were obtained to evaluate the vascular anatomy. Carotid stenosis measurements (when applicable) are obtained utilizing NASCET criteria, using the distal internal carotid diameter as the denominator. CONTRAST:  40mL OMNIPAQUE IOHEXOL 350 MG/ML SOLN COMPARISON:  CT head 05/31/2019 FINDINGS: CTA NECK FINDINGS Aortic arch: Standard branching. Imaged portion shows no evidence of aneurysm or dissection. No significant stenosis of the major arch vessel origins. Right carotid system: Right carotid appears normal. Negative for stenosis or dissection Left carotid system: Mild atherosclerotic calcification left carotid bifurcation without stenosis. Vertebral arteries: Both vertebral arteries are patent to the basilar without significant stenosis. Mild atherosclerotic plaque left vertebral artery origin. Skeleton: Cervical spondylosis.  No acute skeletal abnormality. Other neck: Negative Upper chest: Mild apical emphysema.  No acute abnormality. Review of the MIP images confirms the above findings CTA HEAD FINDINGS Anterior circulation: Cavernous carotid artery widely patent bilaterally without stenosis or aneurysm. Anterior and middle cerebral arteries widely patent bilaterally without stenosis or aneurysm. Posterior circulation: Both vertebral arteries patent to the basilar. PICA patent bilaterally. Basilar widely patent. Superior cerebellar and posterior cerebral arteries patent bilaterally. Negative for aneurysm Venous sinuses: Normal venous enhancement Anatomic variants: None Review of the MIP images confirms the above findings  IMPRESSION: 1. No significant intracranial extracranial stenosis 2. Negative for cerebral aneurysm 3. Mild atherosclerotic disease left carotid bifurcation and left vertebral artery origin. Electronically Signed   By: Franchot Gallo M.D.   On: 05/31/2019 15:01   Ct Angio Neck W And/or Wo Contrast  Result Date: 05/31/2019 CLINICAL DATA:  Left arm numbness. Blurred vision. Rule out subarachnoid hemorrhage. EXAM: CT ANGIOGRAPHY HEAD AND NECK TECHNIQUE: Multidetector CT imaging of the head and neck was performed using the standard protocol during bolus administration of intravenous contrast. Multiplanar CT image reconstructions and MIPs were obtained to evaluate the vascular anatomy. Carotid stenosis measurements (when applicable) are obtained utilizing NASCET criteria, using the distal internal carotid diameter as the denominator. CONTRAST:  66mL OMNIPAQUE IOHEXOL 350 MG/ML SOLN COMPARISON:  CT head 05/31/2019 FINDINGS: CTA NECK FINDINGS Aortic arch: Standard branching. Imaged portion shows no evidence of aneurysm or dissection. No significant stenosis of the major arch vessel origins. Right carotid system: Right carotid appears normal. Negative for stenosis or dissection Left carotid system: Mild atherosclerotic calcification left carotid bifurcation without stenosis. Vertebral arteries: Both vertebral arteries are patent to the basilar without significant stenosis. Mild atherosclerotic plaque left vertebral artery origin. Skeleton: Cervical spondylosis.  No acute skeletal abnormality. Other neck: Negative Upper chest: Mild apical emphysema.  No acute abnormality. Review of the MIP images confirms the above findings CTA HEAD FINDINGS Anterior circulation: Cavernous carotid artery widely patent bilaterally without stenosis or aneurysm. Anterior and middle cerebral arteries widely patent bilaterally without stenosis or aneurysm. Posterior circulation: Both vertebral arteries patent to the basilar. PICA patent  bilaterally. Basilar widely patent. Superior cerebellar and posterior cerebral arteries patent bilaterally. Negative for aneurysm Venous sinuses: Normal venous enhancement Anatomic variants: None Review of the MIP images confirms the above findings IMPRESSION: 1. No significant intracranial extracranial stenosis 2. Negative for cerebral aneurysm 3. Mild atherosclerotic disease left carotid bifurcation and left vertebral artery origin. Electronically Signed   By: Franchot Gallo M.D.   On: 05/31/2019 15:01   Ct Head Code Stroke Wo Contrast  Result Date: 05/31/2019 CLINICAL DATA:  Code stroke. Dizziness and left arm numbness. Left eye blurred vision. EXAM: CT HEAD WITHOUT CONTRAST TECHNIQUE: Contiguous axial images were obtained from the base of the skull through the vertex without intravenous contrast. COMPARISON:  MRI head 03/18/2016 FINDINGS: Brain: Ventricle size and cerebral volume normal for age. Mild patchy white matter hyperintensity in the parietal lobes bilaterally similar to the prior MRI. Negative for acute infarct, hemorrhage, mass Vascular: Negative for hyperdense vessel Skull: Negative Sinuses/Orbits: Negative Other: None ASPECTS ([place] Stroke Program Early CT Score) - Ganglionic level infarction (caudate, lentiform nuclei, internal capsule, insula, M1-M3 cortex): 7 - Supraganglionic infarction (M4-M6 cortex): 3 Total score (0-10 with 10 being normal): 10 IMPRESSION: 1. No acute intracranial abnormality 2. ASPECTS is 10 3. These results were called by telephone at the time of interpretation on 05/31/2019 at 1:46 pm to provider [facility] , who verbally acknowledged these results. Electronically Signed   By: Franchot Gallo M.D.   On: 05/31/2019 13:47    ____________________________________________   PROCEDURES  Procedure(s) performed (including Critical Care):  Procedures   ____________________________________________   INITIAL IMPRESSION / ASSESSMENT AND PLAN / ED COURSE  DAMARKUS PAGANO was evaluated in Emergency Department on 05/31/2019 for the symptoms described in the history of present illness. He was evaluated in the context of the global COVID-19 pandemic, which necessitated consideration that the patient might be at risk for infection with the SARS-CoV-2 virus that causes COVID-19. Institutional protocols and algorithms that pertain to the evaluation of patients at risk for COVID-19 are in a state of rapid change based on information released by regulatory bodies including the CDC and federal and state organizations. These policies and algorithms were followed during the patient's care in the ED.    Patient with episode of left-sided blurry vision and bradycardia.  Seen by Dr. Doy Mince who feels we should admit the patient and work him up for TIA/stroke.  Additionally his heart rate is in the low 40s after work on that as well.          ____________________________________________   FINAL CLINICAL IMPRESSION(S) / ED DIAGNOSES  Final diagnoses:  TIA (transient  ischemic attack)  Also bradycardia   ED Discharge Orders    None       Note:  This document was prepared using Dragon voice recognition software and may include unintentional dictation errors.    Nena Polio, MD 05/31/19 [phone]

## 2019-05-31 NOTE — ED Notes (Signed)
Patient transported to CT 

## 2019-05-31 NOTE — ED Triage Notes (Signed)
Pt noticed numbness in his left arm and prior to bedtime the numbness went away. Pt stated that around 11am his left eye became really blurry and he felt like he was going to pass out. Pt states that right now his vision is clear and denies nausea, HA and dizziness. NIH score was 0.

## 2019-05-31 NOTE — ED Notes (Signed)
Code  Stroke  Called  To 333 

## 2019-05-31 NOTE — Progress Notes (Signed)
CODE STROKE- PHARMACY COMMUNICATION   Time CODE STROKE called/page received:1343  Time response to CODE STROKE was made (in person or via phone): 1343   Time Stroke Kit retrieved from [place] (only if needed):  N/A  Name of Provider/Nurse contacted: Dr. Doy Mince.  Spoke to Dr. Doy Mince in the room regarding if tPA will be needed. Doctor stated that patient is suffering TIA and that it is not needed. Will continue to follow patient.  Past Medical History:  Diagnosis Date  . Benign neoplasm of colon   . Chronic airway obstruction, not elsewhere classified   . DDD (degenerative disc disease), cervical   . Degeneration of cervical intervertebral disc   . Dysphagia   . Dysuria   . Hyperlipidemia   . 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 Lattice degeneration of peripheral retina   . Leg varices 03/10/2015  . Osteoarthrosis, unspecified whether generalized or localized, hand   . Posttraumatic stress disorder   . Sinus bradycardia 04/17/2014  . Varicose veins of bilateral lower extremities with other complications   . Wears dentures    partial upper and lower   Prior to Admission medications   Medication Sig Start Date End Date Taking? Authorizing Provider  atorvastatin (LIPITOR) 10 MG tablet Take 1 tablet (10 mg total) by mouth daily. 09/28/18   Glean Hess, MD  azelastine (ASTELIN) 0.1 % nasal spray  11/18/15   [provider]  baclofen (LIORESAL) 10 MG tablet Take 5 mg by mouth 3 (three) times daily.  04/01/19   [provider]  Calcium Carbonate-Vit D-Min (CALCIUM 1200 PO) Take 1 mg by mouth.    [provider]  finasteride (PROSCAR) 5 MG tablet Take 5 mg by mouth daily.    [provider]  fluticasone (FLONASE) 50 MCG/ACT nasal spray Place 2 sprays into both nostrils daily. Reported on 03/01/2016    [provider]  gabapentin (NEURONTIN) 100 MG capsule Take 1-3 capsules (100-300 mg total) by mouth at bedtime.  05/09/19   Glean Hess, MD  glucosamine-chondroitin 500-400 MG tablet Take 1 tablet by mouth daily.    [provider]  Loperamide HCl (IMODIUM A-D PO) Take by mouth as needed.     [provider]  naproxen (NAPROSYN) 500 MG tablet Take 500 mg by mouth 2 (two) times daily with a meal.    [provider]  omeprazole (PRILOSEC) 40 MG capsule Take 40 mg by mouth daily.  09/27/18   [provider]  Probiotic Product (PROBIOTIC DAILY PO) Take by mouth.    [provider]  Saw Palmetto 450 MG CAPS Take by mouth daily.    [provider]  tamsulosin (FLOMAX) 0.4 MG CAPS capsule Take 0.4 mg by mouth.    [provider]  traZODone (DESYREL) 50 MG tablet Take 50 mg by mouth at bedtime.    [provider]    Pearla Dubonnet ,PharmD Clinical Pharmacist  05/31/2019  2:36 PM

## 2019-05-31 NOTE — H&P (Signed)
Timothy Scott NAME: Timothy Scott    MR#:  [id number]  DATE OF BIRTH:  Sep 20, 1945  DATE OF ADMISSION:  05/31/2019  PRIMARY CARE PHYSICIAN: Glean Hess, MD   REQUESTING/REFERRING PHYSICIAN: Dr. Conni Slipper  CHIEF COMPLAINT: Left arm numbness, change in vision   Chief Complaint  Patient presents with  . Blurred Vision    HISTORY OF PRESENT ILLNESS:  Timothy Scott  is a 73 y.o. male with a known history of GERD came to emergency room because he felt the left arm numbness, blurred vision in the left eye started this afternoon at 11 AM and symptoms resolved by the time he came to emergency room, initial NIH stroke scale of 0.  Patient is seen by Dr. Alexis Goodell neurologist recommended observation for work-up for TIA.  Patient denies any blurred vision or numbness.  All his symptoms resolved, his speech is also clear.  No neurological symptoms at this time.  PAST MEDICAL HISTORY:   Past Medical History:  Diagnosis Date  . Benign neoplasm of colon   . Chronic airway obstruction, not elsewhere classified   . DDD (degenerative disc disease), cervical   . Degeneration of cervical intervertebral disc   . Dysphagia   . Dysuria   . Hyperlipidemia   . 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 Lattice degeneration of peripheral retina   . Leg varices 03/10/2015  . Osteoarthrosis, unspecified whether generalized or localized, hand   . Posttraumatic stress disorder   . Sinus bradycardia 04/17/2014  . Varicose veins of bilateral lower extremities with other complications   . Wears dentures    partial upper and lower    PAST SURGICAL HISTOIRY:   Past Surgical History:  Procedure Laterality Date  . CATARACT EXTRACTION Right 2015  . CATARACT EXTRACTION W/PHACO Left 10/21/2015   Procedure: CATARACT EXTRACTION PHACO AND INTRAOCULAR LENS PLACEMENT (IOC);  Surgeon: Leandrew Koyanagi, MD;  Location:  [facility];  Service: Ophthalmology;  Laterality: Left;  . CATARACT EXTRACTION, BILATERAL  2016  . COLONOSCOPY  2014   benign polyps  . CYSTOSCOPY    . ESOPHAGEAL MANOMETRY N/A 08/03/2016   Procedure: ESOPHAGEAL MANOMETRY (EM);  Surgeon: Mauri Pole, MD;  Location: [facility];  Service: Endoscopy;  Laterality: N/A;  . ESOPHAGOGASTRODUODENOSCOPY (EGD) WITH PROPOFOL N/A 02/17/2016   with esoph dilatation  . TONSILLECTOMY      SOCIAL HISTORY:   Social History   Tobacco Use  . Smoking status: Former Smoker    Packs/day: 0.50    Years: 20.00    Pack years: 10.00    Types: Cigarettes, Pipe, Cigars    Quit date: 04/05/1986    Years since quitting: 33.1  . Smokeless tobacco: Never Used  . Tobacco comment: smoking cessation materials not required  Substance Use Topics  . Alcohol use: Yes    Alcohol/week: 14.0 standard drinks    Types: 14 Standard drinks or equivalent per week    Comment: socially    FAMILY HISTORY:   Family History  Problem Relation Age of Onset  . Hypertension Mother   . Cancer Mother        breast  . Aneurysm Mother   . Healthy Sister   . Prostate cancer Neg Hx   . Kidney cancer Neg Hx   . Bladder Cancer Neg Hx     DRUG ALLERGIES:  No Known Allergies  REVIEW OF SYSTEMS:  CONSTITUTIONAL: No fever,  fatigue or weakness.  EYES: No blurred or double vision.  EARS, NOSE, AND THROAT: No tinnitus or ear pain.  RESPIRATORY: No cough, shortness of breath, wheezing or hemoptysis.  CARDIOVASCULAR: No chest pain, orthopnea, edema.  GASTROINTESTINAL: No nausea, vomiting, diarrhea or abdominal pain.  GENITOURINARY: No dysuria, hematuria.  ENDOCRINE: No polyuria, nocturia,  HEMATOLOGY: No anemia, easy bruising or bleeding SKIN: No rash or lesion. MUSCULOSKELETAL: No joint pain or arthritis.   NEUROLOGIC: No tingling, numbness, weakness.  PSYCHIATRY: No anxiety or depression.   MEDICATIONS AT HOME:   Prior to Admission medications    Medication Sig Start Date End Date Taking? Authorizing Provider  Calcium Carbonate-Vit D-Min (CALCIUM 1200 PO) Take 1 tablet by mouth daily.    Yes [provider]  finasteride (PROSCAR) 5 MG tablet Take 5 mg by mouth daily.   Yes [provider]  fluticasone (FLONASE) 50 MCG/ACT nasal spray Place 2 sprays into both nostrils daily as needed for allergies or rhinitis.    Yes [provider]  gabapentin (NEURONTIN) 100 MG capsule Take 1-3 capsules (100-300 mg total) by mouth at bedtime. 05/09/19  Yes Glean Hess, MD  glucosamine-chondroitin 500-400 MG tablet Take 1 tablet by mouth daily.   Yes [provider]  loperamide (IMODIUM A-D) 2 MG capsule Take 2-4 mg by mouth as needed (loose stools).    Yes [provider]  loratadine (CLARITIN) 10 MG tablet Take 10 mg by mouth daily as needed for allergies.   Yes [provider]  naproxen (NAPROSYN) 500 MG tablet Take 500 mg by mouth 2 (two) times daily as needed for mild pain or moderate pain.    Yes [provider]  omeprazole (PRILOSEC) 40 MG capsule Take 40 mg by mouth daily.  09/27/18  Yes [provider]  Probiotic Product (PROBIOTIC DAILY) CAPS Take 1 capsule by mouth daily.    Yes [provider]  Saw Palmetto 450 MG CAPS Take 450 mg by mouth daily.    Yes [provider]  tamsulosin (FLOMAX) 0.4 MG CAPS capsule Take 0.4 mg by mouth every evening.    Yes [provider]  traZODone (DESYREL) 50 MG tablet Take 50 mg by mouth at bedtime.   Yes [provider]  atorvastatin (LIPITOR) 10 MG tablet Take 1 tablet (10 mg total) by mouth daily. Patient not taking: Reported on 05/31/2019 09/28/18   Glean Hess, MD      VITAL SIGNS:  Blood pressure 136/76, pulse (!) 55, temperature 98 F (36.7 C), temperature source Oral, resp. rate (!) 22, height 5\' 11"  (1.803 m), weight 73.9 kg, SpO2 100 %.  PHYSICAL EXAMINATION:  GENERAL:  73  y.o.-year-old patient lying in the bed with no acute distress.  EYES: Pupils equal, round, reactive to light and accommodation. No scleral icterus. Extraocular muscles intact.  HEENT: Head atraumatic, normocephalic. Oropharynx and nasopharynx clear.  NECK:  Supple, no jugular venous distention. No thyroid enlargement, no tenderness.  LUNGS: Normal breath sounds bilaterally, no wheezing, rales,rhonchi or crepitation. No use of accessory muscles of respiration.  CARDIOVASCULAR: S1, S2 normal. No murmurs, rubs, or gallops.  ABDOMEN: Soft, nontender, nondistended. Bowel sounds present. No organomegaly or mass.  EXTREMITIES: No pedal edema, cyanosis, or clubbing.  NEUROLOGIC: Cranial nerves II through XII are intact. Muscle strength 5/5 in all extremities. Sensation intact. Gait not checked.  Patient gag reflex intact.  Speech is clear. PSYCHIATRIC: The patient is alert and oriented x 3.  SKIN: No obvious rash,  lesion, or ulcer.   LABORATORY PANEL:   CBC Recent Labs  Lab 05/31/19 1338  WBC 7.2  HGB 14.1  HCT 41.5  PLT 291   ------------------------------------------------------------------------------------------------------------------  Chemistries  Recent Labs  Lab 05/31/19 1338  NA 140  K 4.0  CL 104  CO2 29  GLUCOSE 100*  BUN 17  CREATININE 0.73  CALCIUM 9.3  AST 23  ALT 22  ALKPHOS 43  BILITOT 0.9   ------------------------------------------------------------------------------------------------------------------  Cardiac Enzymes No results for input(s): TROPONINI in the last 168 hours. ------------------------------------------------------------------------------------------------------------------  RADIOLOGY:  Ct Angio Head W Or Wo Contrast  Result Date: 05/31/2019 CLINICAL DATA:  Left arm numbness. Blurred vision. Rule out subarachnoid hemorrhage. EXAM: CT ANGIOGRAPHY HEAD AND NECK TECHNIQUE: Multidetector CT imaging of the head and neck was performed using the  standard protocol during bolus administration of intravenous contrast. Multiplanar CT image reconstructions and MIPs were obtained to evaluate the vascular anatomy. Carotid stenosis measurements (when applicable) are obtained utilizing NASCET criteria, using the distal internal carotid diameter as the denominator. CONTRAST:  71mL OMNIPAQUE IOHEXOL 350 MG/ML SOLN COMPARISON:  CT head 05/31/2019 FINDINGS: CTA NECK FINDINGS Aortic arch: Standard branching. Imaged portion shows no evidence of aneurysm or dissection. No significant stenosis of the major arch vessel origins. Right carotid system: Right carotid appears normal. Negative for stenosis or dissection Left carotid system: Mild atherosclerotic calcification left carotid bifurcation without stenosis. Vertebral arteries: Both vertebral arteries are patent to the basilar without significant stenosis. Mild atherosclerotic plaque left vertebral artery origin. Skeleton: Cervical spondylosis.  No acute skeletal abnormality. Other neck: Negative Upper chest: Mild apical emphysema.  No acute abnormality. Review of the MIP images confirms the above findings CTA HEAD FINDINGS Anterior circulation: Cavernous carotid artery widely patent bilaterally without stenosis or aneurysm. Anterior and middle cerebral arteries widely patent bilaterally without stenosis or aneurysm. Posterior circulation: Both vertebral arteries patent to the basilar. PICA patent bilaterally. Basilar widely patent. Superior cerebellar and posterior cerebral arteries patent bilaterally. Negative for aneurysm Venous sinuses: Normal venous enhancement Anatomic variants: None Review of the MIP images confirms the above findings IMPRESSION: 1. No significant intracranial extracranial stenosis 2. Negative for cerebral aneurysm 3. Mild atherosclerotic disease left carotid bifurcation and left vertebral artery origin. Electronically Signed   By: Franchot Gallo M.D.   On: 05/31/2019 15:01   Ct Angio Neck W  And/or Wo Contrast  Result Date: 05/31/2019 CLINICAL DATA:  Left arm numbness. Blurred vision. Rule out subarachnoid hemorrhage. EXAM: CT ANGIOGRAPHY HEAD AND NECK TECHNIQUE: Multidetector CT imaging of the head and neck was performed using the standard protocol during bolus administration of intravenous contrast. Multiplanar CT image reconstructions and MIPs were obtained to evaluate the vascular anatomy. Carotid stenosis measurements (when applicable) are obtained utilizing NASCET criteria, using the distal internal carotid diameter as the denominator. CONTRAST:  63mL OMNIPAQUE IOHEXOL 350 MG/ML SOLN COMPARISON:  CT head 05/31/2019 FINDINGS: CTA NECK FINDINGS Aortic arch: Standard branching. Imaged portion shows no evidence of aneurysm or dissection. No significant stenosis of the major arch vessel origins. Right carotid system: Right carotid appears normal. Negative for stenosis or dissection Left carotid system: Mild atherosclerotic calcification left carotid bifurcation without stenosis. Vertebral arteries: Both vertebral arteries are patent to the basilar without significant stenosis. Mild atherosclerotic plaque left vertebral artery origin. Skeleton: Cervical spondylosis.  No acute skeletal abnormality. Other neck: Negative Upper chest: Mild apical emphysema.  No acute abnormality. Review of the MIP images confirms the above findings CTA HEAD FINDINGS Anterior circulation: Cavernous  carotid artery widely patent bilaterally without stenosis or aneurysm. Anterior and middle cerebral arteries widely patent bilaterally without stenosis or aneurysm. Posterior circulation: Both vertebral arteries patent to the basilar. PICA patent bilaterally. Basilar widely patent. Superior cerebellar and posterior cerebral arteries patent bilaterally. Negative for aneurysm Venous sinuses: Normal venous enhancement Anatomic variants: None Review of the MIP images confirms the above findings IMPRESSION: 1. No significant  intracranial extracranial stenosis 2. Negative for cerebral aneurysm 3. Mild atherosclerotic disease left carotid bifurcation and left vertebral artery origin. Electronically Signed   By: Franchot Gallo M.D.   On: 05/31/2019 15:01   Ct Head Code Stroke Wo Contrast  Result Date: 05/31/2019 CLINICAL DATA:  Code stroke. Dizziness and left arm numbness. Left eye blurred vision. EXAM: CT HEAD WITHOUT CONTRAST TECHNIQUE: Contiguous axial images were obtained from the base of the skull through the vertex without intravenous contrast. COMPARISON:  MRI head 03/18/2016 FINDINGS: Brain: Ventricle size and cerebral volume normal for age. Mild patchy white matter hyperintensity in the parietal lobes bilaterally similar to the prior MRI. Negative for acute infarct, hemorrhage, mass Vascular: Negative for hyperdense vessel Skull: Negative Sinuses/Orbits: Negative Other: None ASPECTS ([place] Stroke Program Early CT Score) - Ganglionic level infarction (caudate, lentiform nuclei, internal capsule, insula, M1-M3 cortex): 7 - Supraganglionic infarction (M4-M6 cortex): 3 Total score (0-10 with 10 being normal): 10 IMPRESSION: 1. No acute intracranial abnormality 2. ASPECTS is 10 3. These results were called by telephone at the time of interpretation on 05/31/2019 at 1:46 pm to provider [facility] , who verbally acknowledged these results. Electronically Signed   By: Franchot Gallo M.D.   On: 05/31/2019 13:47    EKG:   Orders placed or performed during the hospital encounter of 05/31/19  . ED EKG  . ED EKG  . EKG 12-Lead  . EKG 12-Lead    IMPRESSION AND PLAN:   73 year old male patient with history of GERD on PPIs at home comes in because of left eye blurred vision, left hand numbness and symptoms resolved by the time he came to ER,  #1 possible TIA with left arm numbness, transient vision changes, initial head CT did not show acute changes.  CT angios also negative for acute hemodynamically significant stenosis,  admitted to observation status for TIA work-up including echocardiogram, MRI of the brain.  Started on aspirin.  Check hemoglobin A1c, fasting lipids, PT OT consult and speech consult, patient is n.p.o. until stroke screen, will give a dual antiplatelet therapy including Plavix 75 mg for 3 weeks followed by aspirin 81 mg alone after that as per neurology recommendation.  Continue frequent  neurochecks.  Discussed with patient about this, agreeable to stay. 2.  Sinus bradycardia symptomatic,, patient says that his heart rate is always around 50 bpm.  All the records are reviewed and case discussed with ED provider. Management plans discussed with the patient, family and they are in agreement.  CODE STATUS: Full code  TOTAL TIME TAKING CARE OF THIS PATIENT: 55 minutes.    Epifanio Lesches M.D on 05/31/2019 at 4:40 PM  Between 7am to 6pm - Pager - [phone]  After 6pm go to [email or web address] - password [email or web address] [facility]  Office  [phone]  CC: Primary care physician; Glean Hess, MD  Note: This dictation was prepared with Dragon dictation along with smaller phrase technology. Any transcriptional errors that result from this process are unintentional.

## 2019-05-31 NOTE — Progress Notes (Signed)
   05/31/19 2000  Clinical Encounter Type  Visited With Patient  Visit Type Initial;Follow-up;ED  Referral From Nurse  Spiritual Encounters  Spiritual Needs Emotional;Grief support  Stress Factors  Patient Stress Factors Loss;Loss of control

## 2019-06-01 ENCOUNTER — Encounter: Payer: Self-pay | Admitting: Internal Medicine

## 2019-06-01 ENCOUNTER — Observation Stay
Admit: 2019-06-01 | Discharge: 2019-06-01 | Disposition: A | Discharge status: Home / Self Care | Payer: PPO | Attending: Internal Medicine | Admitting: Internal Medicine

## 2019-06-01 DIAGNOSIS — H538 Other visual disturbances: Secondary | ICD-10-CM | POA: Diagnosis not present

## 2019-06-01 DIAGNOSIS — R9430 Abnormal result of cardiovascular function study, unspecified: Secondary | ICD-10-CM | POA: Insufficient documentation

## 2019-06-01 DIAGNOSIS — I509 Heart failure, unspecified: Secondary | ICD-10-CM | POA: Diagnosis not present

## 2019-06-01 DIAGNOSIS — I351 Nonrheumatic aortic (valve) insufficiency: Secondary | ICD-10-CM | POA: Insufficient documentation

## 2019-06-01 DIAGNOSIS — R200 Anesthesia of skin: Secondary | ICD-10-CM | POA: Diagnosis not present

## 2019-06-01 DIAGNOSIS — G459 Transient cerebral ischemic attack, unspecified: Secondary | ICD-10-CM | POA: Diagnosis not present

## 2019-06-01 DIAGNOSIS — K219 Gastro-esophageal reflux disease without esophagitis: Secondary | ICD-10-CM | POA: Diagnosis not present

## 2019-06-01 LAB — CBC
HCT: 40.4 % (ref 39.0–52.0)
Hemoglobin: 14.1 g/dL (ref 13.0–17.0)
MCH: 33.3 pg (ref 26.0–34.0)
MCHC: 34.9 g/dL (ref 30.0–36.0)
MCV: 95.3 fL (ref 80.0–100.0)
Platelets: 271 10*3/uL (ref 150–400)
RBC: 4.24 MIL/uL (ref 4.22–5.81)
RDW: 11.6 % (ref 11.5–15.5)
WBC: 6.7 10*3/uL (ref 4.0–10.5)
nRBC: 0 % (ref 0.0–0.2)

## 2019-06-01 LAB — BASIC METABOLIC PANEL
Anion gap: 8 (ref 5–15)
BUN: 13 mg/dL (ref 8–23)
CO2: 27 mmol/L (ref 22–32)
Calcium: 8.9 mg/dL (ref 8.9–10.3)
Chloride: 107 mmol/L (ref 98–111)
Creatinine, Ser: 0.68 mg/dL (ref 0.61–1.24)
GFR calc Af Amer: >60 mL/min (ref >60)
GFR calc non Af Amer: >60 mL/min (ref >60)
Glucose, Bld: 97 mg/dL (ref 70–99)
Potassium: 3.9 mmol/L (ref 3.5–5.1)
Sodium: 142 mmol/L (ref 135–145)

## 2019-06-01 LAB — LIPID PANEL
Cholesterol: 152 mg/dL (ref 0–200)
HDL: 65 mg/dL (ref >40)
LDL Cholesterol: 77 mg/dL (ref 0–99)
Total CHOL/HDL Ratio: 2.3 RATIO
Triglycerides: 48 mg/dL (ref <150)
VLDL: 10 mg/dL (ref 0–40)

## 2019-06-01 LAB — ECHOCARDIOGRAM COMPLETE
Height: 71 in
Weight: 2553.81 oz

## 2019-06-01 LAB — GLUCOSE, CAPILLARY: Glucose-Capillary: 92 mg/dL (ref 70–99)

## 2019-06-01 LAB — SARS CORONAVIRUS 2 (TAT 6-24 HRS): SARS Coronavirus 2: NEGATIVE

## 2019-06-01 MED ORDER — ASPIRIN 81 MG PO TBEC: 81.0000 mg | DELAYED_RELEASE_TABLET | Freq: Every day | ORAL | 0 refills | Status: DC

## 2019-06-01 NOTE — Evaluation (Signed)
SLP Cancellation Note  Patient Details Name: Timothy Scott MRN: [id number] DOB: Jul 31, 1946   Cancelled treatment:       Reason Eval/Treat Not Completed: Medical issues which prohibited therapy  Pt states he has tightness in his throat that has been consistant over the last several years. He states he has followed up with GI, ENT, and MD to discover cause. Pt stated no cause has been determined. Pt stated he has had MBSS before but may benefit from a MBSS if he has symptoms occurring. ST will not complete BSE as pt states he is not having this issue now, but MBSS may be beneficial in future.    [place] Sauber 06/01/2019, 9:12 AM

## 2019-06-01 NOTE — Progress Notes (Signed)
Dr Vianne Bulls requested to place an order for discharge. Pt will be discharged home.

## 2019-06-01 NOTE — Progress Notes (Addendum)
Pt is being discharged home.  Discharge papers given and explained to pt.  Pt verbalized understanding.  Meds and f/u appointments reviewed.  Pt needs to schedule a f/u appointment with PCP in a week. Rx given.

## 2019-06-01 NOTE — Progress Notes (Addendum)
Stable for discharge but waiting for echocardiogram results.  Says that he has very sensitive skin and was on aspirin long time ago but stopped because of his bleeding tendencies and does not want to take any blood thinners if not needed.  Has history of sinus bradycardia has heart rate always low around 40s to 50s.  Talk with Dr. Doy Mince, because of his presentation and concern for decrease in the TIA recommends dual antiplatelet therapy , patient now wants to milligrams only.  Advised him to continue statins.

## 2019-06-01 NOTE — Progress Notes (Addendum)
Subjective: Patient at baseline.  No new neurological complaints.    Objective: Current vital signs: BP (!) 142/82 (BP Location: Left Arm)   Pulse (!) 52   Temp 97.8 F (36.6 C)   Resp 18   Ht 5\' 11"  (1.803 m)   Wt 72.4 kg   SpO2 98%   BMI 22.26 kg/m  Vital signs in last 24 hours: Temp:  [97.7 F (36.5 C)-98.1 F (36.7 C)] 97.8 F (36.6 C) (09/26 1007) Pulse Rate:  [41-55] 52 (09/26 1007) Resp:  [10-22] 18 (09/26 1007) BP: (133-174)/(70-119) 142/82 (09/26 1007) SpO2:  [79 %-100 %] 98 % (09/26 1007) FiO2 (%):  [21 %] 21 % (09/25 1544) Weight:  [72.4 kg-73.9 kg] 72.4 kg (09/26 0415)  Intake/Output from previous day: No intake/output data recorded. Intake/Output this shift: Total I/O In: 240 [P.O.:240] Out: -  Nutritional status:  Diet Order            Diet 2 gram sodium Room service appropriate? Yes; Fluid consistency: Thin  Diet effective now              Neurologic Exam: Mental Status: Alert, oriented, thought content appropriate.  Speech fluent without evidence of aphasia.  Able to follow 3 step commands without difficulty. Cranial Nerves: II: Discs flat bilaterally; Visual fields grossly normal, pupils equal, round, reactive to light and accommodation III,IV, VI: ptosis not present, extra-ocular motions intact bilaterally V,VII: smile symmetric, facial light touch sensation normal bilaterally VIII: hearing normal bilaterally IX,X: gag reflex present XI: bilateral shoulder shrug XII: midline tongue extension Motor: 5/5 throughout Sensory: Pinprick and light touch intact throughout, bilaterally  Lab Results: Basic Metabolic Panel: Recent Labs  Lab 05/31/19 1338 06/01/19 0435  NA 140 142  K 4.0 3.9  CL 104 107  CO2 29 27  GLUCOSE 100* 97  BUN 17 13  CREATININE 0.73 0.68  CALCIUM 9.3 8.9    Liver Function Tests: Recent Labs  Lab 05/31/19 1338  AST 23  ALT 22  ALKPHOS 43  BILITOT 0.9  PROT 6.9  ALBUMIN 4.4   No results for input(s):  LIPASE, AMYLASE in the last 168 hours. No results for input(s): AMMONIA in the last 168 hours.  CBC: Recent Labs  Lab 05/31/19 1338 06/01/19 0435  WBC 7.2 6.7  NEUTROABS 5.0  --   HGB 14.1 14.1  HCT 41.5 40.4  MCV 97.0 95.3  PLT 291 271    Cardiac Enzymes: No results for input(s): CKTOTAL, CKMB, CKMBINDEX, TROPONINI in the last 168 hours.  Lipid Panel: Recent Labs  Lab 06/01/19 0435  CHOL 152  TRIG 48  HDL 65  CHOLHDL 2.3  VLDL 10  LDLCALC 77    CBG: Recent Labs  Lab 05/31/19 1333 06/01/19 0826  GLUCAP 96 92    Microbiology: Results for orders placed or performed during the hospital encounter of 05/31/19  SARS CORONAVIRUS 2 (TAT 6-24 HRS) Nasopharyngeal Nasopharyngeal Swab     Status: None   Collection Time: 05/31/19  2:52 PM   Specimen: Nasopharyngeal Swab  Result Value Ref Range Status   SARS Coronavirus 2 NEGATIVE NEGATIVE Final    Comment: (NOTE) SARS-CoV-2 target nucleic acids are NOT DETECTED. The SARS-CoV-2 RNA is generally detectable in upper and lower respiratory specimens during the acute phase of infection. Negative results do not preclude SARS-CoV-2 infection, do not rule out co-infections with other pathogens, and should not be used as the sole basis for treatment or other patient management decisions. Negative results must be  combined with clinical observations, patient history, and epidemiological information. The expected result is Negative. Fact Sheet for Patients: SugarRoll.be Fact Sheet for Healthcare Providers: https://www.woods-mathews.com/ This test is not yet approved or cleared by the [place] FDA and  has been authorized for detection and/or diagnosis of SARS-CoV-2 by FDA under an Emergency Use Authorization (EUA). This EUA will remain  in effect (meaning this test can be used) for the duration of the COVID-19 declaration under Section 56 4(b)(1) of the Act, 21 U.S.C. section  360bbb-3(b)(1), unless the authorization is terminated or revoked sooner. Performed at [facility], [place] [address], [place], [place] [address]     Coagulation Studies: Recent Labs    05/31/19 1338  LABPROT 13.6  INR 1.1    Imaging: Ct Angio Head W Or Wo Contrast  Result Date: 05/31/2019 CLINICAL DATA:  Left arm numbness. Blurred vision. Rule out subarachnoid hemorrhage. EXAM: CT ANGIOGRAPHY HEAD AND NECK TECHNIQUE: Multidetector CT imaging of the head and neck was performed using the standard protocol during bolus administration of intravenous contrast. Multiplanar CT image reconstructions and MIPs were obtained to evaluate the vascular anatomy. Carotid stenosis measurements (when applicable) are obtained utilizing NASCET criteria, using the distal internal carotid diameter as the denominator. CONTRAST:  40mL OMNIPAQUE IOHEXOL 350 MG/ML SOLN COMPARISON:  CT head 05/31/2019 FINDINGS: CTA NECK FINDINGS Aortic arch: Standard branching. Imaged portion shows no evidence of aneurysm or dissection. No significant stenosis of the major arch vessel origins. Right carotid system: Right carotid appears normal. Negative for stenosis or dissection Left carotid system: Mild atherosclerotic calcification left carotid bifurcation without stenosis. Vertebral arteries: Both vertebral arteries are patent to the basilar without significant stenosis. Mild atherosclerotic plaque left vertebral artery origin. Skeleton: Cervical spondylosis.  No acute skeletal abnormality. Other neck: Negative Upper chest: Mild apical emphysema.  No acute abnormality. Review of the MIP images confirms the above findings CTA HEAD FINDINGS Anterior circulation: Cavernous carotid artery widely patent bilaterally without stenosis or aneurysm. Anterior and middle cerebral arteries widely patent bilaterally without stenosis or aneurysm. Posterior circulation: Both vertebral arteries patent to the basilar. PICA patent bilaterally. Basilar  widely patent. Superior cerebellar and posterior cerebral arteries patent bilaterally. Negative for aneurysm Venous sinuses: Normal venous enhancement Anatomic variants: None Review of the MIP images confirms the above findings IMPRESSION: 1. No significant intracranial extracranial stenosis 2. Negative for cerebral aneurysm 3. Mild atherosclerotic disease left carotid bifurcation and left vertebral artery origin. Electronically Signed   By: Franchot Gallo M.D.   On: 05/31/2019 15:01   Ct Angio Neck W And/or Wo Contrast  Result Date: 05/31/2019 CLINICAL DATA:  Left arm numbness. Blurred vision. Rule out subarachnoid hemorrhage. EXAM: CT ANGIOGRAPHY HEAD AND NECK TECHNIQUE: Multidetector CT imaging of the head and neck was performed using the standard protocol during bolus administration of intravenous contrast. Multiplanar CT image reconstructions and MIPs were obtained to evaluate the vascular anatomy. Carotid stenosis measurements (when applicable) are obtained utilizing NASCET criteria, using the distal internal carotid diameter as the denominator. CONTRAST:  21mL OMNIPAQUE IOHEXOL 350 MG/ML SOLN COMPARISON:  CT head 05/31/2019 FINDINGS: CTA NECK FINDINGS Aortic arch: Standard branching. Imaged portion shows no evidence of aneurysm or dissection. No significant stenosis of the major arch vessel origins. Right carotid system: Right carotid appears normal. Negative for stenosis or dissection Left carotid system: Mild atherosclerotic calcification left carotid bifurcation without stenosis. Vertebral arteries: Both vertebral arteries are patent to the basilar without significant stenosis. Mild atherosclerotic plaque left vertebral artery  origin. Skeleton: Cervical spondylosis.  No acute skeletal abnormality. Other neck: Negative Upper chest: Mild apical emphysema.  No acute abnormality. Review of the MIP images confirms the above findings CTA HEAD FINDINGS Anterior circulation: Cavernous carotid artery widely  patent bilaterally without stenosis or aneurysm. Anterior and middle cerebral arteries widely patent bilaterally without stenosis or aneurysm. Posterior circulation: Both vertebral arteries patent to the basilar. PICA patent bilaterally. Basilar widely patent. Superior cerebellar and posterior cerebral arteries patent bilaterally. Negative for aneurysm Venous sinuses: Normal venous enhancement Anatomic variants: None Review of the MIP images confirms the above findings IMPRESSION: 1. No significant intracranial extracranial stenosis 2. Negative for cerebral aneurysm 3. Mild atherosclerotic disease left carotid bifurcation and left vertebral artery origin. Electronically Signed   By: Franchot Gallo M.D.   On: 05/31/2019 15:01   Mr Brain Wo Contrast  Result Date: 05/31/2019 CLINICAL DATA:  Left arm numbness and blurred vision beginning today. EXAM: MRI HEAD WITHOUT CONTRAST TECHNIQUE: Multiplanar, multiecho pulse sequences of the brain and surrounding structures were obtained without intravenous contrast. COMPARISON:  CT studies today. FINDINGS: Brain: Diffusion imaging does not show any acute or subacute infarction. The brainstem and cerebellum are normal. Cerebral hemispheres show moderate chronic small-vessel ischemic changes of the white matter. No cortical or large vessel territory infarction. No mass lesion, hemorrhage, hydrocephalus or extra-axial collection. Vascular: Major vessels at the base of the brain show flow. Skull and upper cervical spine: Negative Sinuses/Orbits: Clear/normal Other: None IMPRESSION: No acute finding by MRI. Moderate chronic small-vessel ischemic changes of the cerebral hemispheric white matter. Electronically Signed   By: Nelson Chimes M.D.   On: 05/31/2019 19:28   Ct Head Code Stroke Wo Contrast  Result Date: 05/31/2019 CLINICAL DATA:  Code stroke. Dizziness and left arm numbness. Left eye blurred vision. EXAM: CT HEAD WITHOUT CONTRAST TECHNIQUE: Contiguous axial images were  obtained from the base of the skull through the vertex without intravenous contrast. COMPARISON:  MRI head 03/18/2016 FINDINGS: Brain: Ventricle size and cerebral volume normal for age. Mild patchy white matter hyperintensity in the parietal lobes bilaterally similar to the prior MRI. Negative for acute infarct, hemorrhage, mass Vascular: Negative for hyperdense vessel Skull: Negative Sinuses/Orbits: Negative Other: None ASPECTS ([place] Stroke Program Early CT Score) - Ganglionic level infarction (caudate, lentiform nuclei, internal capsule, insula, M1-M3 cortex): 7 - Supraganglionic infarction (M4-M6 cortex): 3 Total score (0-10 with 10 being normal): 10 IMPRESSION: 1. No acute intracranial abnormality 2. ASPECTS is 10 3. These results were called by telephone at the time of interpretation on 05/31/2019 at 1:46 pm to provider [facility] , who verbally acknowledged these results. Electronically Signed   By: Franchot Gallo M.D.   On: 05/31/2019 13:47    Medications:  I have reviewed the patient's current medications. Scheduled: . aspirin EC  81 mg Oral Daily  . clopidogrel  75 mg Oral Daily  . docusate sodium  100 mg Oral BID    Assessment/Plan: 73 y.o. male presenting with transient episodes of left arm numbness, today associated with left visual field loss.  Concerning for crescendo TIA.  MRI of the brain reviewed and shows no acute changes.  CTA unremarkable.  Echocardiogram is pending.  LDL 77.  A1c pending.  Although DUAP recommended patient is only agreeable to ASA.  On a statin.    Recommendations: 1.  Would increase Lipitor for aggressive lipid management with target LDL<70. 2. ASA 81mg  daily 3. Echocardiogram -pending 4. Patient to follow up on an outpatient basis  LOS: 0 days   Alexis Goodell, MD Neurology [phone] 06/01/2019  11:38 AM

## 2019-06-01 NOTE — Progress Notes (Signed)
Waiting for echo results for possible discharge.  Epic text message Dr. Neoma Laming reading the echo.

## 2019-06-01 NOTE — Progress Notes (Signed)
OT Screen Note  Patient Details Name: Timothy Scott MRN: [id number] DOB: 1945/12/15   OT Screen:    Reason Eval/Treat Not Completed: OT screened, no needs identified, will sign off. Thank you for the OT consult. Order received, chart reviewed. Upon arrival to unit, observed pt ambulating in room without assistance as well as standing at sink completing oral care independently. Pt reporting his symptoms have generally resolved. Despite some remaining eye fatigue, pt states he is feeling back to baseline functional independence. No skilled OT needs identified. Will sign off. Please re-consult if additional needs arise during this admission.   Shara Blazing, M.S., OTR/L Ascom: [phone] 06/01/19, 8:57 AM

## 2019-06-01 NOTE — Evaluation (Signed)
Physical Therapy Evaluation Patient Details Name: Timothy Scott MRN: [id number] DOB: 28-Oct-1945 Today's Date: 06/01/2019   History of Present Illness  Pt is a 73 y.o. male presenting to hospital 05/31/19 with L arm numbness and L visual field loss/blurry vision (symptom's resolved per chart).  CT of head and MRI of brain negative for acute intracranial abnormality.  Pt admitted with possible TIA.  PMH includes cervical DDD, dysphagia, sinus bradycardia, SSS, vertigo, lumbar disc herniation with radiculopathy.  Clinical Impression  Prior to hospital admission, pt was independent.  Pt lives alone in 1 level home with 1 step to enter.  Currently pt is independent with bed mobility, transfers, ambulation around nursing loop (no AD), and navigating 1 step without UE support.  Pt demonstrates occasional mild scissoring gait but pt reports this is baseline for a long time.  No loss of balance with any functional mobility during session and no loss of balance with additional balance activities (ambulation with head turns R/L/up/down, increasing/decreasing speed with ambulation, ambulating and then turning 180 degrees and stopping, and ambulating figure 8 pattern).  Pt reports B eye "weakness" in general during session (nurse notified) and just feels he needs to be more careful with higher level balance activities d/t this (ex: pt states he would not get on a ladder at this time).  Pt reports being at baseline level of functional mobility: no acute PT needs identified; will sign off.  Pt educated to follow up with PCP for OP PT needs if he feels like he has any balance concerns.    Follow Up Recommendations No PT follow up    Equipment Recommendations  None recommended by PT    Recommendations for Other Services       Precautions / Restrictions Precautions Precautions: Fall Restrictions Weight Bearing Restrictions: No      Mobility  Bed Mobility Overal bed mobility: Independent              General bed mobility comments: Supine to/from sit without any noted difficulties  Transfers Overall transfer level: Independent Equipment used: None             General transfer comment: steady safe transfers noted  Ambulation/Gait Ambulation/Gait assistance: Independent Gait Distance (Feet): 250 Feet Assistive device: None   Gait velocity: normal   General Gait Details: pt occasionally with mild scissoring gait but no loss of balance noted (pt reports baseline)  Stairs Stairs: Yes Stairs assistance: Independent Stair Management: No rails Number of Stairs: 1 General stair comments: steady safe stairs navigation  Wheelchair Mobility    Modified Rankin (Stroke Patients Only)       Balance Overall balance assessment: Mild deficits observed, not formally tested(pt reports baseline occasional scissoring of gait)                                           Pertinent Vitals/Pain Pain Assessment: 0-10 Pain Score: 2  Pain Location: chronic (for a few years per pt report) pressure/tightness in throat Pain Descriptors / Indicators: Pressure;Tightness Pain Intervention(s): Limited activity within patient's tolerance;Monitored during session  Vitals (HR and O2 on room air) stable and WFL throughout treatment session.    Home Living Family/patient expects to be discharged to:: Private residence Living Arrangements: Alone   Type of Home: House Home Access: Stairs to enter Entrance Stairs-Rails: None Entrance Stairs-Number of Steps: 1 Home Layout: One level Home  Equipment: None      Prior Function Level of Independence: Independent               Hand Dominance        Extremity/Trunk Assessment   Upper Extremity Assessment Upper Extremity Assessment: (4+/5 B shoulder flexion, elbow flexion/extension, and strong B hand grip strength; no pronator drift noted; no coordination deficits noted; pt denies any tingling/numbness in B UE's)     Lower Extremity Assessment Lower Extremity Assessment: (4+/5 B hip flexion, knee flexion/extension, and DF; intact B LE heel to shin coordination, proprioception, tone, and light touch B LE's)    Cervical / Trunk Assessment Cervical / Trunk Assessment: Normal  Communication   Communication: No difficulties  Cognition Arousal/Alertness: Awake/alert Behavior During Therapy: WFL for tasks assessed/performed Overall Cognitive Status: Within Functional Limits for tasks assessed                                        General Comments   Nursing cleared pt for participation in physical therapy.  Pt agreeable to PT session.    Exercises     Assessment/Plan    PT Assessment Patent does not need any further PT services  PT Problem List         PT Treatment Interventions      PT Goals (Current goals can be found in the Care Plan section)  Acute Rehab PT Goals Patient Stated Goal: to go home PT Goal Formulation: With patient Time For Goal Achievement: 06/15/19 Potential to Achieve Goals: Good    Frequency     Barriers to discharge        Co-evaluation               AM-PAC PT "6 Clicks" Mobility  Outcome Measure Help needed turning from your back to your side while in a flat bed without using bedrails?: None Help needed moving from lying on your back to sitting on the side of a flat bed without using bedrails?: None Help needed moving to and from a bed to a chair (including a wheelchair)?: None Help needed standing up from a chair using your arms (e.g., wheelchair or bedside chair)?: None Help needed to walk in hospital room?: None Help needed climbing 3-5 steps with a railing? : None 6 Click Score: 24    End of Session   Activity Tolerance: Patient tolerated treatment well Patient left: in bed;with call bell/phone within reach Nurse Communication: Mobility status;Precautions PT Visit Diagnosis: Unsteadiness on feet (R26.81)    Time:  [id number] PT Time Calculation (min) (ACUTE ONLY): 21 min   Charges:   PT Evaluation $PT Eval Low Complexity: 1 Low         Casi Westerfeld, PT 06/01/19, 11:03 AM [phone]

## 2019-06-01 NOTE — Plan of Care (Signed)
  Problem: Education: Goal: Knowledge of General Education information will improve Description Including pain rating scale, medication(s)/side effects and non-pharmacologic comfort measures Outcome: Progressing   Problem: Health Behavior/Discharge Planning: Goal: Ability to manage health-related needs will improve Outcome: Progressing   Problem: Clinical Measurements: Goal: Ability to maintain clinical measurements within normal limits will improve Outcome: Progressing Goal: Will remain free from infection Outcome: Progressing Goal: Diagnostic test results will improve Outcome: Progressing Goal: Respiratory complications will improve Outcome: Progressing Goal: Cardiovascular complication will be avoided Outcome: Progressing   Problem: Activity: Goal: Risk for activity intolerance will decrease Outcome: Progressing   Problem: Nutrition: Goal: Adequate nutrition will be maintained Outcome: Progressing   Problem: Coping: Goal: Level of anxiety will decrease Outcome: Progressing   Problem: Elimination: Goal: Will not experience complications related to bowel motility Outcome: Progressing Goal: Will not experience complications related to urinary retention Outcome: Progressing   Problem: Pain Managment: Goal: General experience of comfort will improve Outcome: Progressing   Problem: Safety: Goal: Ability to remain free from injury will improve Outcome: Progressing   Problem: Skin Integrity: Goal: Risk for impaired skin integrity will decrease Outcome: Progressing   Problem: Education: Goal: Knowledge of secondary prevention will improve Outcome: Progressing Goal: Knowledge of patient specific risk factors addressed and post discharge goals established will improve Outcome: Progressing   

## 2019-06-01 NOTE — Progress Notes (Signed)
Received notification from telemetry patient is having sustained 2.5 second pauses, HR 70, asymptomatic. Notified Dr. Sidney Ace, stated to monitor and if patient became symptomatic we would transfer to tele bed.

## 2019-06-03 LAB — HEMOGLOBIN A1C
Hgb A1c MFr Bld: 5.1 % (ref 4.8–5.6)
Mean Plasma Glucose: 100 mg/dL

## 2019-06-04 NOTE — Discharge Summary (Signed)
Timothy Scott, is a 73 y.o. male  DOB July 30, 1946  MRN [id number].  Admission date:  05/31/2019  Admitting Physician  Epifanio Lesches, MD  Discharge Date:  06/01/2019   Primary MD  Glean Hess, MD  Recommendations for primary care physician for things to follow:   With PCP in 1 week   Admission Diagnosis  TIA (transient ischemic attack) [G45.9]   Discharge Diagnosis  TIA (transient ischemic attack) [G45.9]   Active Problems:   TIA (transient ischemic attack)      Past Medical History:  Diagnosis Date  . Benign neoplasm of colon   . Chronic airway obstruction, not elsewhere classified   . DDD (degenerative disc disease), cervical   . Degeneration of cervical intervertebral disc   . Dysphagia   . Dysuria   . Hyperlipidemia   . 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 Lattice degeneration of peripheral retina   . Leg varices 03/10/2015  . Osteoarthrosis, unspecified whether generalized or localized, hand   . Posttraumatic stress disorder   . Sinus bradycardia 04/17/2014  . Varicose veins of bilateral lower extremities with other complications   . Wears dentures    partial upper and lower    Past Surgical History:  Procedure Laterality Date  . CATARACT EXTRACTION Right 2015  . CATARACT EXTRACTION W/PHACO Left 10/21/2015   Procedure: CATARACT EXTRACTION PHACO AND INTRAOCULAR LENS PLACEMENT (IOC);  Surgeon: Leandrew Koyanagi, MD;  Location: [facility];  Service: Ophthalmology;  Laterality: Left;  . CATARACT EXTRACTION, BILATERAL  2016  . COLONOSCOPY  2014   benign polyps  . CYSTOSCOPY    . ESOPHAGEAL MANOMETRY N/A 08/03/2016   Procedure: ESOPHAGEAL MANOMETRY (EM);  Surgeon: Mauri Pole, MD;  Location: [facility];  Service: Endoscopy;  Laterality: N/A;  .  ESOPHAGOGASTRODUODENOSCOPY (EGD) WITH PROPOFOL N/A 02/17/2016   with esoph dilatation  . TONSILLECTOMY         History of present illness and  Hospital Course:     Kindly see H&P for history of present illness and admission details, please review complete Labs, Consult reports and Test reports for all details in brief  HPI  from the history and physical done on the day of admission 73 year old male with no past medical history except GERD comes in because of sudden onset of visual changes, left upper extremity weakness.  Admitted for TIA evaluation.   Hospital Course  #1 TIA symptoms concerning for  crescendo TIA ; TIA with visual disturbances, left arm numbness of the symptoms improved by the time he came to ER so TPA not received, seen by neurologist, full stroke work-up including ultrasound of carotids, echocardiogram, brain MRI are done, brain MRI did not show acute changes, CT Angio of carotids unremarkable, echocardiogram showed EF more than 50%, LDL is 77.  Neurologist saw the patient, concerning for represent a TIA patient is recommended to take a dual antiplatelet therapy including aspirin, Plavix but patient shows only aspirin.  He does not want to take dual antiplatelet therapy.  So continued on aspirin, statins, discharged home.  No neurological deficit at the time of discharge.    Discharge Condition: Stable   Follow UP  Follow-up with PCP in 1 week   Discharge Instructions  and  Discharge Medications      Allergies as of 06/01/2019   No Known Allergies     Medication List    TAKE these medications   aspirin 81 MG EC tablet Take  1 tablet (81 mg total) by mouth daily.   atorvastatin 10 MG tablet Commonly known as: LIPITOR Take 1 tablet (10 mg total) by mouth daily.   CALCIUM 1200 PO Take 1 tablet by mouth daily.   finasteride 5 MG tablet Commonly known as: PROSCAR Take 5 mg by mouth daily.   fluticasone 50 MCG/ACT nasal spray Commonly known as:  FLONASE Place 2 sprays into both nostrils daily as needed for allergies or rhinitis.   gabapentin 100 MG capsule Commonly known as: NEURONTIN Take 1-3 capsules (100-300 mg total) by mouth at bedtime.   glucosamine-chondroitin 500-400 MG tablet Take 1 tablet by mouth daily.   Imodium A-D 2 MG capsule Generic drug: loperamide Take 2-4 mg by mouth as needed (loose stools).   loratadine 10 MG tablet Commonly known as: CLARITIN Take 10 mg by mouth daily as needed for allergies.   naproxen 500 MG tablet Commonly known as: NAPROSYN Take 500 mg by mouth 2 (two) times daily as needed for mild pain or moderate pain.   omeprazole 40 MG capsule Commonly known as: PRILOSEC Take 40 mg by mouth daily.   Probiotic Daily Caps Take 1 capsule by mouth daily.   Saw Palmetto 450 MG Caps Take 450 mg by mouth daily.   tamsulosin 0.4 MG Caps capsule Commonly known as: FLOMAX Take 0.4 mg by mouth every evening.   traZODone 50 MG tablet Commonly known as: DESYREL Take 50 mg by mouth at bedtime.         Diet and Activity recommendation: See Discharge Instructions above   Consults obtained -neurology   Major procedures and Radiology Reports - PLEASE review detailed and final reports for all details, in brief -     Ct Angio Head W Or Wo Contrast  Result Date: 05/31/2019 CLINICAL DATA:  Left arm numbness. Blurred vision. Rule out subarachnoid hemorrhage. EXAM: CT ANGIOGRAPHY HEAD AND NECK TECHNIQUE: Multidetector CT imaging of the head and neck was performed using the standard protocol during bolus administration of intravenous contrast. Multiplanar CT image reconstructions and MIPs were obtained to evaluate the vascular anatomy. Carotid stenosis measurements (when applicable) are obtained utilizing NASCET criteria, using the distal internal carotid diameter as the denominator. CONTRAST:  69mL OMNIPAQUE IOHEXOL 350 MG/ML SOLN COMPARISON:  CT head 05/31/2019 FINDINGS: CTA NECK FINDINGS  Aortic arch: Standard branching. Imaged portion shows no evidence of aneurysm or dissection. No significant stenosis of the major arch vessel origins. Right carotid system: Right carotid appears normal. Negative for stenosis or dissection Left carotid system: Mild atherosclerotic calcification left carotid bifurcation without stenosis. Vertebral arteries: Both vertebral arteries are patent to the basilar without significant stenosis. Mild atherosclerotic plaque left vertebral artery origin. Skeleton: Cervical spondylosis.  No acute skeletal abnormality. Other neck: Negative Upper chest: Mild apical emphysema.  No acute abnormality. Review of the MIP images confirms the above findings CTA HEAD FINDINGS Anterior circulation: Cavernous carotid artery widely patent bilaterally without stenosis or aneurysm. Anterior and middle cerebral arteries widely patent bilaterally without stenosis or aneurysm. Posterior circulation: Both vertebral arteries patent to the basilar. PICA patent bilaterally. Basilar widely patent. Superior cerebellar and posterior cerebral arteries patent bilaterally. Negative for aneurysm Venous sinuses: Normal venous enhancement Anatomic variants: None Review of the MIP images confirms the above findings IMPRESSION: 1. No significant intracranial extracranial stenosis 2. Negative for cerebral aneurysm 3. Mild atherosclerotic disease left carotid bifurcation and left vertebral artery origin. Electronically Signed   By: Franchot Gallo M.D.   On: 05/31/2019 15:01  Ct Angio Neck W And/or Wo Contrast  Result Date: 05/31/2019 CLINICAL DATA:  Left arm numbness. Blurred vision. Rule out subarachnoid hemorrhage. EXAM: CT ANGIOGRAPHY HEAD AND NECK TECHNIQUE: Multidetector CT imaging of the head and neck was performed using the standard protocol during bolus administration of intravenous contrast. Multiplanar CT image reconstructions and MIPs were obtained to evaluate the vascular anatomy. Carotid stenosis  measurements (when applicable) are obtained utilizing NASCET criteria, using the distal internal carotid diameter as the denominator. CONTRAST:  51mL OMNIPAQUE IOHEXOL 350 MG/ML SOLN COMPARISON:  CT head 05/31/2019 FINDINGS: CTA NECK FINDINGS Aortic arch: Standard branching. Imaged portion shows no evidence of aneurysm or dissection. No significant stenosis of the major arch vessel origins. Right carotid system: Right carotid appears normal. Negative for stenosis or dissection Left carotid system: Mild atherosclerotic calcification left carotid bifurcation without stenosis. Vertebral arteries: Both vertebral arteries are patent to the basilar without significant stenosis. Mild atherosclerotic plaque left vertebral artery origin. Skeleton: Cervical spondylosis.  No acute skeletal abnormality. Other neck: Negative Upper chest: Mild apical emphysema.  No acute abnormality. Review of the MIP images confirms the above findings CTA HEAD FINDINGS Anterior circulation: Cavernous carotid artery widely patent bilaterally without stenosis or aneurysm. Anterior and middle cerebral arteries widely patent bilaterally without stenosis or aneurysm. Posterior circulation: Both vertebral arteries patent to the basilar. PICA patent bilaterally. Basilar widely patent. Superior cerebellar and posterior cerebral arteries patent bilaterally. Negative for aneurysm Venous sinuses: Normal venous enhancement Anatomic variants: None Review of the MIP images confirms the above findings IMPRESSION: 1. No significant intracranial extracranial stenosis 2. Negative for cerebral aneurysm 3. Mild atherosclerotic disease left carotid bifurcation and left vertebral artery origin. Electronically Signed   By: Franchot Gallo M.D.   On: 05/31/2019 15:01   Ct Cervical Spine Wo Contrast  Result Date: 05/16/2019 CLINICAL DATA:  Neck pain and upper extremity numbness. EXAM: CT CERVICAL SPINE WITHOUT CONTRAST TECHNIQUE: Multidetector CT imaging of the  cervical spine was performed without intravenous contrast. Multiplanar CT image reconstructions were also generated. COMPARISON:  07/14/2017 CT cervical spine FINDINGS: Alignment: No static subluxation. Facets are aligned. Occipital condyles and the lateral masses of C1 and C2 are normally approximated. Skull base and vertebrae: No acute fracture. Soft tissues and spinal canal: No prevertebral fluid or swelling. No visible canal hematoma. Disc levels: C2-3: Mild left uncovertebral spurring with mild left foraminal stenosis, unchanged. C3-4: Mild endplate spurring with mild bilateral foraminal narrowing, unchanged. C4-5: Intermediate sized disc osteophyte complex, greatest in the left subarticular zone. Right-greater-than-left uncovertebral spurring with severe right and moderate left foraminal stenosis, unchanged. No central spinal canal stenosis. C5-6: Intermediate sized disc osteophyte complex with moderate right and severe left neural foraminal stenosis, unchanged. No central spinal canal stenosis. C6-7: Small disc osteophyte complex without spinal canal or neural foraminal stenosis. Upper chest: No pneumothorax, pulmonary nodule or pleural effusion. Other: Normal visualized paraspinal cervical soft tissues. IMPRESSION: 1. No acute fracture or static subluxation of the cervical spine. 2. Multilevel moderate to severe neural foraminal stenosis, worst at the C4-5 and C5-6 levels, unchanged. Electronically Signed   By: Ulyses Jarred M.D.   On: 05/16/2019 21:15   Mr Brain Wo Contrast  Result Date: 05/31/2019 CLINICAL DATA:  Left arm numbness and blurred vision beginning today. EXAM: MRI HEAD WITHOUT CONTRAST TECHNIQUE: Multiplanar, multiecho pulse sequences of the brain and surrounding structures were obtained without intravenous contrast. COMPARISON:  CT studies today. FINDINGS: Brain: Diffusion imaging does not show any acute or subacute infarction. The  brainstem and cerebellum are normal. Cerebral hemispheres  show moderate chronic small-vessel ischemic changes of the white matter. No cortical or large vessel territory infarction. No mass lesion, hemorrhage, hydrocephalus or extra-axial collection. Vascular: Major vessels at the base of the brain show flow. Skull and upper cervical spine: Negative Sinuses/Orbits: Clear/normal Other: None IMPRESSION: No acute finding by MRI. Moderate chronic small-vessel ischemic changes of the cerebral hemispheric white matter. Electronically Signed   By: Nelson Chimes M.D.   On: 05/31/2019 19:28   Ct Head Code Stroke Wo Contrast  Result Date: 05/31/2019 CLINICAL DATA:  Code stroke. Dizziness and left arm numbness. Left eye blurred vision. EXAM: CT HEAD WITHOUT CONTRAST TECHNIQUE: Contiguous axial images were obtained from the base of the skull through the vertex without intravenous contrast. COMPARISON:  MRI head 03/18/2016 FINDINGS: Brain: Ventricle size and cerebral volume normal for age. Mild patchy white matter hyperintensity in the parietal lobes bilaterally similar to the prior MRI. Negative for acute infarct, hemorrhage, mass Vascular: Negative for hyperdense vessel Skull: Negative Sinuses/Orbits: Negative Other: None ASPECTS (Speculator Stroke Program Early CT Score) - Ganglionic level infarction (caudate, lentiform nuclei, internal capsule, insula, M1-M3 cortex): 7 - Supraganglionic infarction (M4-M6 cortex): 3 Total score (0-10 with 10 being normal): 10 IMPRESSION: 1. No acute intracranial abnormality 2. ASPECTS is 10 3. These results were called by telephone at the time of interpretation on 05/31/2019 at 1:46 pm to provider [facility] , who verbally acknowledged these results. Electronically Signed   By: Franchot Gallo M.D.   On: 05/31/2019 13:47    Micro Results     Recent Results (from the past 240 hour(s))  SARS CORONAVIRUS 2 (TAT 6-24 HRS) Nasopharyngeal Nasopharyngeal Swab     Status: None   Collection Time: 05/31/19  2:52 PM   Specimen: Nasopharyngeal Swab   Result Value Ref Range Status   SARS Coronavirus 2 NEGATIVE NEGATIVE Final    Comment: (NOTE) SARS-CoV-2 target nucleic acids are NOT DETECTED. The SARS-CoV-2 RNA is generally detectable in upper and lower respiratory specimens during the acute phase of infection. Negative results do not preclude SARS-CoV-2 infection, do not rule out co-infections with other pathogens, and should not be used as the sole basis for treatment or other patient management decisions. Negative results must be combined with clinical observations, patient history, and epidemiological information. The expected result is Negative. Fact Sheet for Patients: SugarRoll.be Fact Sheet for Healthcare Providers: https://www.woods-mathews.com/ This test is not yet approved or cleared by the [place] FDA and  has been authorized for detection and/or diagnosis of SARS-CoV-2 by FDA under an Emergency Use Authorization (EUA). This EUA will remain  in effect (meaning this test can be used) for the duration of the COVID-19 declaration under Section 56 4(b)(1) of the Act, 21 U.S.C. section 360bbb-3(b)(1), unless the authorization is terminated or revoked sooner. Performed at [facility], [place] [address], [place], [place] [address]        Today   Subjective:   Authur Laughead today has no headache,no chest abdominal pain,no new weakness tingling or numbness, feels much better wants to go home today.   Objective:   Blood pressure (!) 142/82, pulse (!) 52, temperature 97.8 F (36.6 C), resp. rate 18, height 5\' 11"  (1.803 m), weight 72.4 kg, SpO2 98 %.  No intake or output data in the 24 hours ending 06/04/19 1534  Exam Awake Alert, Oriented x 3, No new F.N deficits, Normal affect [place].AT,PERRAL Supple Neck,No JVD, No cervical lymphadenopathy appriciated.  Symmetrical Chest wall movement, Good air movement bilaterally, CTAB RRR,No Gallops,Rubs or new Murmurs, No  Parasternal Heave +ve B.Sounds, Abd Soft, Non tender, No organomegaly appriciated, No rebound -guarding or rigidity. No Cyanosis, Clubbing or edema, No new Rash or bruise  Data Review   CBC w Diff:  Lab Results  Component Value Date   WBC 6.7 06/01/2019   HGB 14.1 06/01/2019   HGB 14.2 05/09/2019   HCT 40.4 06/01/2019   HCT 42.3 05/09/2019   PLT 271 06/01/2019   PLT 320 05/09/2019   LYMPHOPCT 18 05/31/2019   MONOPCT 10 05/31/2019   EOSPCT 2 05/31/2019   BASOPCT 1 05/31/2019    CMP:  Lab Results  Component Value Date   NA 142 06/01/2019   NA 143 05/09/2019   K 3.9 06/01/2019   CL 107 06/01/2019   CO2 27 06/01/2019   BUN 13 06/01/2019   BUN 17 05/09/2019   CREATININE 0.68 06/01/2019   GLU 91 12/13/2017   PROT 6.9 05/31/2019   PROT 6.6 05/09/2019   ALBUMIN 4.4 05/31/2019   ALBUMIN 4.5 05/09/2019   BILITOT 0.9 05/31/2019   BILITOT 0.4 05/09/2019   ALKPHOS 43 05/31/2019   AST 23 05/31/2019   ALT 22 05/31/2019  .   Total Time in preparing paper work, data evaluation and todays exam - 35 minutes  Epifanio Lesches M.D on 06/01/2019 at 3:34 PM    Note: This dictation was prepared with Dragon dictation along with smaller phrase technology. Any transcriptional errors that result from this process are unintentional.

## 2019-06-06 ENCOUNTER — Encounter

## 2019-06-06 ENCOUNTER — Ambulatory Visit (INDEPENDENT_AMBULATORY_CARE_PROVIDER_SITE_OTHER): Payer: PPO | Admitting: Gastroenterology

## 2019-06-06 ENCOUNTER — Encounter: Payer: Self-pay | Admitting: Gastroenterology

## 2019-06-06 ENCOUNTER — Other Ambulatory Visit: Payer: Self-pay

## 2019-06-06 VITALS — BP 121/66 | HR 60 | Temp 98.1°F | Ht 71.0 in | Wt 165.0 lb

## 2019-06-06 DIAGNOSIS — R1319 Other dysphagia: Secondary | ICD-10-CM | POA: Diagnosis not present

## 2019-06-06 NOTE — Progress Notes (Signed)
Gastroenterology Consultation  Referring Provider:     Glean Hess, MD Primary Care Physician:  Glean Hess, MD Primary Gastroenterologist:  Dr. Allen Norris     Reason for Consultation:     Dysphasia        HPI:   Timothy Scott is a 73 y.o. y/o male referred for consultation & management of dysphasia by Dr. Army Melia, Jesse Sans, MD.  This patient comes in today after being followed by Dr. Vira Agar and his nurse practitioner for some time.  The patient had an issue with dysphasia all the way back to 2017.  At that time the patient had an EGD with dilation although no stricture was found.  Subsequent to that he had a high-resolution esophageal manometry that did not show any sign of achalasia but was diagnosed with EGJ flow obstruction.  The patient had a normal modified barium swallow in 2018.  A CT scan of the soft tissue of the neck in 2017 for the dysphasia and choking that was normal.  Prior to that CT scan a barium swallow which was normal.  The patient was just recently discharged from the hospital with TIAs.  From the report of the manometry it was recommended that the patient undergo impedance and a pH study and it was documented that the patient refused these.  The patient's modified barium swallow was reported to show "minimal oropharyngeal dysphagia (difficulty transitioning food/liquid from mouth to pharynx)".  There was also a report that cervical osteophytes may be impinging on his upper esophagus causing him the sensation of dysphasia.  The patient was given a trial of baclofen in August by his primary care provider.  After having an extensive work-up at the [facility] clinic with multiple investigational modalities the patient is now here to see me today for the same symptoms. The patient states that his main problem right now is more of a fullness in his throat more than dysphasia.  He also reports that intermittently he will start coughing and choking and feels like he cannot catch  his breath.   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    Lattice degeneration of peripheral retina    Leg varices 03/10/2015   Osteoarthrosis, unspecified whether generalized or localized, hand    Posttraumatic stress disorder    Sinus bradycardia 04/17/2014   Varicose veins of bilateral lower extremities with other complications    Wears dentures    partial upper and lower    Past Surgical History:  Procedure Laterality Date   CATARACT EXTRACTION Right 2015   CATARACT EXTRACTION W/PHACO Left 10/21/2015   Procedure: CATARACT EXTRACTION PHACO AND INTRAOCULAR LENS PLACEMENT ([place]);  Surgeon: Leandrew Koyanagi, MD;  Location: [facility];  Service: Ophthalmology;  Laterality: Left;   CATARACT EXTRACTION, BILATERAL  2016   COLONOSCOPY  2014   benign polyps   CYSTOSCOPY     ESOPHAGEAL MANOMETRY N/A 08/03/2016   Procedure: ESOPHAGEAL MANOMETRY (EM);  Surgeon: Mauri Pole, MD;  Location: [facility];  Service: Endoscopy;  Laterality: N/A;   ESOPHAGOGASTRODUODENOSCOPY (EGD) WITH PROPOFOL N/A 02/17/2016   with esoph dilatation   TONSILLECTOMY      Prior to Admission medications  Medication Sig Start Date End Date Taking? Authorizing Provider  aspirin EC 81 MG EC tablet Take 1 tablet (81 mg total) by mouth daily. 06/02/19   Epifanio Lesches, MD  atorvastatin (LIPITOR) 10 MG tablet Take 1 tablet (10 mg total) by mouth daily. Patient not taking: Reported on 05/31/2019 09/28/18   Glean Hess, MD  Calcium Carbonate-Vit D-Min (CALCIUM 1200 PO) Take 1 tablet by mouth daily.     [provider]  finasteride (PROSCAR) 5 MG tablet Take 5 mg by mouth daily.     [provider]  fluticasone (FLONASE) 50 MCG/ACT nasal spray Place 2 sprays into both nostrils daily as needed for allergies or rhinitis.     [provider]  gabapentin (NEURONTIN) 100 MG capsule Take 1-3 capsules (100-300 mg total) by mouth at bedtime. 05/09/19   Glean Hess, MD  glucosamine-chondroitin 500-400 MG tablet Take 1 tablet by mouth daily.    [provider]  loperamide (IMODIUM A-D) 2 MG capsule Take 2-4 mg by mouth as needed (loose stools).     [provider]  loratadine (CLARITIN) 10 MG tablet Take 10 mg by mouth daily as needed for allergies.    [provider]  naproxen (NAPROSYN) 500 MG tablet Take 500 mg by mouth 2 (two) times daily as needed for mild pain or moderate pain.     [provider]  omeprazole (PRILOSEC) 40 MG capsule Take 40 mg by mouth daily.  09/27/18   [provider]  Probiotic Product (PROBIOTIC DAILY) CAPS Take 1 capsule by mouth daily.     [provider]  Saw Palmetto 450 MG CAPS Take 450 mg by mouth daily.     [provider]  tamsulosin (FLOMAX) 0.4 MG CAPS capsule Take 0.4 mg by mouth every evening.     [provider]  traZODone (DESYREL) 50 MG tablet Take 50 mg by mouth at bedtime.    [provider]    Family History  Problem Relation Age of Onset   Hypertension Mother    Cancer Mother        breast   Aneurysm Mother    Healthy Sister    Prostate cancer Neg Hx    Kidney cancer Neg Hx    Bladder Cancer Neg Hx      Social History   Tobacco Use   Smoking status: Former Smoker    Packs/day: 0.50    Years: 20.00    Pack years: 10.00    Types: Cigarettes, Pipe, Cigars    Quit date: 04/05/1986    Years since quitting: 33.1   Smokeless tobacco: Never Used   Tobacco comment: smoking cessation materials not required  Substance Use Topics   Alcohol use: Yes    Alcohol/week: 14.0 standard drinks    Types: 14 Standard drinks or  equivalent per week    Comment: socially   Drug use: No    Allergies as of 06/06/2019   (No Known Allergies)    Review of Systems:    All systems reviewed and negative except where noted in HPI.   Physical Exam:  There were no vitals taken for this visit. No LMP for male patient. General:   Alert,  Well-developed, well-nourished, pleasant and cooperative in NAD Head:  Normocephalic and atraumatic. Eyes:  Sclera clear, no icterus.   Conjunctiva pink. Ears:  Normal auditory acuity. Nose:  No deformity, discharge, or lesions. Mouth:  No deformity or lesions,oropharynx pink & moist. Neck:  Supple; no masses or thyromegaly. Lungs:  Respirations even and unlabored.  Clear throughout to auscultation.   No wheezes, crackles, or rhonchi. No acute distress. Heart:  Regular rate and rhythm; no murmurs, clicks, rubs, or gallops. Abdomen:  Normal bowel sounds.  No bruits.  Soft, non-tender and non-distended without masses, hepatosplenomegaly or hernias noted.  No guarding or rebound tenderness.  Negative Carnett sign.   Rectal:  Deferred.  Msk:  Symmetrical without gross deformities.  Good, equal movement & strength bilaterally. Pulses:  Normal pulses noted. Extremities:  No clubbing or edema.  No cyanosis. Neurologic:  Alert and oriented x3;  grossly normal neurologically. Skin:  Intact without significant lesions or rashes.  No jaundice. Lymph Nodes:  No significant cervical adenopathy. Psych:  Alert and cooperative. Normal mood and affect.  Imaging Studies: Ct Angio Head W Or Wo Contrast  Result Date: 05/31/2019 CLINICAL DATA:  Left arm numbness. Blurred vision. Rule out subarachnoid hemorrhage. EXAM: CT ANGIOGRAPHY HEAD AND NECK TECHNIQUE: Multidetector CT imaging of the head and neck was performed using the standard protocol during bolus administration of intravenous contrast. Multiplanar CT image reconstructions and MIPs were obtained to evaluate the vascular anatomy. Carotid stenosis  measurements (when applicable) are obtained utilizing NASCET criteria, using the distal internal carotid diameter as the denominator. CONTRAST:  56mL OMNIPAQUE IOHEXOL 350 MG/ML SOLN COMPARISON:  CT head 05/31/2019 FINDINGS: CTA NECK FINDINGS Aortic arch: Standard branching. Imaged portion shows no evidence of aneurysm or dissection. No significant stenosis of the major arch vessel origins. Right carotid system: Right carotid appears normal. Negative for stenosis or dissection Left carotid system: Mild atherosclerotic calcification left carotid bifurcation without stenosis. Vertebral arteries: Both vertebral arteries are patent to the basilar without significant stenosis. Mild atherosclerotic plaque left vertebral artery origin. Skeleton: Cervical spondylosis.  No acute skeletal abnormality. Other neck: Negative Upper chest: Mild apical emphysema.  No acute abnormality. Review of the MIP images confirms the above findings CTA HEAD FINDINGS Anterior circulation: Cavernous carotid artery widely patent bilaterally without stenosis or aneurysm. Anterior and middle cerebral arteries widely patent bilaterally without stenosis or aneurysm. Posterior circulation: Both vertebral arteries patent to the basilar. PICA patent bilaterally. Basilar widely patent. Superior cerebellar and posterior cerebral arteries patent bilaterally. Negative for aneurysm Venous sinuses: Normal venous enhancement Anatomic variants: None Review of the MIP images confirms the above findings IMPRESSION: 1. No significant intracranial extracranial stenosis 2. Negative for cerebral aneurysm 3. Mild atherosclerotic disease left carotid bifurcation and left vertebral artery origin. Electronically Signed   By: Franchot Gallo M.D.   On: 05/31/2019 15:01   Ct Angio Neck W And/or Wo Contrast  Result Date: 05/31/2019 CLINICAL DATA:  Left arm numbness. Blurred vision. Rule out subarachnoid hemorrhage. EXAM: CT ANGIOGRAPHY HEAD AND NECK TECHNIQUE:  Multidetector CT imaging of the head and neck was performed using the standard protocol during bolus administration of intravenous contrast. Multiplanar CT image reconstructions and MIPs were obtained to evaluate the vascular anatomy. Carotid stenosis measurements (when applicable) are obtained utilizing NASCET criteria, using the distal internal carotid diameter as the denominator. CONTRAST:  2mL OMNIPAQUE IOHEXOL 350 MG/ML SOLN COMPARISON:  CT head 05/31/2019 FINDINGS: CTA NECK FINDINGS Aortic arch: Standard branching. Imaged portion shows no evidence of aneurysm or dissection. No significant stenosis of the major arch vessel origins. Right carotid system: Right carotid appears normal. Negative for stenosis or dissection Left carotid system: Mild atherosclerotic calcification left carotid bifurcation without stenosis. Vertebral arteries: Both vertebral arteries are patent to the basilar without  significant stenosis. Mild atherosclerotic plaque left vertebral artery origin. Skeleton: Cervical spondylosis.  No acute skeletal abnormality. Other neck: Negative Upper chest: Mild apical emphysema.  No acute abnormality. Review of the MIP images confirms the above findings CTA HEAD FINDINGS Anterior circulation: Cavernous carotid artery widely patent bilaterally without stenosis or aneurysm. Anterior and middle cerebral arteries widely patent bilaterally without stenosis or aneurysm. Posterior circulation: Both vertebral arteries patent to the basilar. PICA patent bilaterally. Basilar widely patent. Superior cerebellar and posterior cerebral arteries patent bilaterally. Negative for aneurysm Venous sinuses: Normal venous enhancement Anatomic variants: None Review of the MIP images confirms the above findings IMPRESSION: 1. No significant intracranial extracranial stenosis 2. Negative for cerebral aneurysm 3. Mild atherosclerotic disease left carotid bifurcation and left vertebral artery origin. Electronically Signed    By: Franchot Gallo M.D.   On: 05/31/2019 15:01   Ct Cervical Spine Wo Contrast  Result Date: 05/16/2019 CLINICAL DATA:  Neck pain and upper extremity numbness. EXAM: CT CERVICAL SPINE WITHOUT CONTRAST TECHNIQUE: Multidetector CT imaging of the cervical spine was performed without intravenous contrast. Multiplanar CT image reconstructions were also generated. COMPARISON:  07/14/2017 CT cervical spine FINDINGS: Alignment: No static subluxation. Facets are aligned. Occipital condyles and the lateral masses of C1 and C2 are normally approximated. Skull base and vertebrae: No acute fracture. Soft tissues and spinal canal: No prevertebral fluid or swelling. No visible canal hematoma. Disc levels: C2-3: Mild left uncovertebral spurring with mild left foraminal stenosis, unchanged. C3-4: Mild endplate spurring with mild bilateral foraminal narrowing, unchanged. C4-5: Intermediate sized disc osteophyte complex, greatest in the left subarticular zone. Right-greater-than-left uncovertebral spurring with severe right and moderate left foraminal stenosis, unchanged. No central spinal canal stenosis. C5-6: Intermediate sized disc osteophyte complex with moderate right and severe left neural foraminal stenosis, unchanged. No central spinal canal stenosis. C6-7: Small disc osteophyte complex without spinal canal or neural foraminal stenosis. Upper chest: No pneumothorax, pulmonary nodule or pleural effusion. Other: Normal visualized paraspinal cervical soft tissues. IMPRESSION: 1. No acute fracture or static subluxation of the cervical spine. 2. Multilevel moderate to severe neural foraminal stenosis, worst at the C4-5 and C5-6 levels, unchanged. Electronically Signed   By: Ulyses Jarred M.D.   On: 05/16/2019 21:15   Mr Brain Wo Contrast  Result Date: 05/31/2019 CLINICAL DATA:  Left arm numbness and blurred vision beginning today. EXAM: MRI HEAD WITHOUT CONTRAST TECHNIQUE: Multiplanar, multiecho pulse sequences of the brain  and surrounding structures were obtained without intravenous contrast. COMPARISON:  CT studies today. FINDINGS: Brain: Diffusion imaging does not show any acute or subacute infarction. The brainstem and cerebellum are normal. Cerebral hemispheres show moderate chronic small-vessel ischemic changes of the white matter. No cortical or large vessel territory infarction. No mass lesion, hemorrhage, hydrocephalus or extra-axial collection. Vascular: Major vessels at the base of the brain show flow. Skull and upper cervical spine: Negative Sinuses/Orbits: Clear/normal Other: None IMPRESSION: No acute finding by MRI. Moderate chronic small-vessel ischemic changes of the cerebral hemispheric white matter. Electronically Signed   By: Nelson Chimes M.D.   On: 05/31/2019 19:28   Ct Head Code Stroke Wo Contrast  Result Date: 05/31/2019 CLINICAL DATA:  Code stroke. Dizziness and left arm numbness. Left eye blurred vision. EXAM: CT HEAD WITHOUT CONTRAST TECHNIQUE: Contiguous axial images were obtained from the base of the skull through the vertex without intravenous contrast. COMPARISON:  MRI head 03/18/2016 FINDINGS: Brain: Ventricle size and cerebral volume normal for age. Mild patchy white matter hyperintensity in the  parietal lobes bilaterally similar to the prior MRI. Negative for acute infarct, hemorrhage, mass Vascular: Negative for hyperdense vessel Skull: Negative Sinuses/Orbits: Negative Other: None ASPECTS ([place] Stroke Program Early CT Score) - Ganglionic level infarction (caudate, lentiform nuclei, internal capsule, insula, M1-M3 cortex): 7 - Supraganglionic infarction (M4-M6 cortex): 3 Total score (0-10 with 10 being normal): 10 IMPRESSION: 1. No acute intracranial abnormality 2. ASPECTS is 10 3. These results were called by telephone at the time of interpretation on 05/31/2019 at 1:46 pm to provider [facility] , who verbally acknowledged these results. Electronically Signed   By: Franchot Gallo M.D.   On:  05/31/2019 13:47    Assessment and Plan:   ANDREWJOSEPH FORRISTER is a 73 y.o. y/o male who comes in today with a history of dysphasia with an extensive work-up.  The patient has had multiple imaging and an endoscopy with dilation.  The patient now comes in with a report of a feeling of fullness in his throat and intermittent choking.  The patient will be given samples of Dexilant to see if this helps his symptoms.  If it does not then we may need to repeat the modified barium swallow since he recently had a TIA and his problem seems to be above the esophagus with a fullness feeling and choking.  The patient has been explained the plan and agrees with it.  [name], MD. Marval Regal    Note: This dictation was prepared with Dragon dictation along with smaller phrase technology. Any transcriptional errors that result from this process are unintentional.

## 2019-06-12 DIAGNOSIS — H43813 Vitreous degeneration, bilateral: Secondary | ICD-10-CM | POA: Diagnosis not present

## 2019-06-13 ENCOUNTER — Telehealth: Payer: Self-pay | Admitting: Gastroenterology

## 2019-06-13 NOTE — Telephone Encounter (Signed)
Pt is calling he was given samples of rx Dexilant he would like to get prescription of the Generic version send to [facility] pharmacy if there is no generic then Dexilant be ok . Also Ginger was trying to obtain pt [facility] clinic colonoscopy reports pt was checking if she has received those.

## 2019-06-14 ENCOUNTER — Other Ambulatory Visit: Payer: Self-pay

## 2019-06-14 MED ORDER — DEXILANT 60 MG PO CPDR: 60.0000 mg | DELAYED_RELEASE_CAPSULE | Freq: Every day | ORAL | 1 refills | Status: DC

## 2019-06-14 NOTE — Telephone Encounter (Signed)
Spoke with pt and sent rx for Dexilant to his pharmacy. Pt will discuss with his wife and contact me next week to schedule.

## 2019-07-04 DIAGNOSIS — L814 Other melanin hyperpigmentation: Secondary | ICD-10-CM | POA: Diagnosis not present

## 2019-07-16 ENCOUNTER — Ambulatory Visit: Payer: PPO | Admitting: Urology

## 2019-09-03 ENCOUNTER — Other Ambulatory Visit: Payer: Self-pay | Admitting: Internal Medicine

## 2019-09-03 DIAGNOSIS — R1319 Other dysphagia: Secondary | ICD-10-CM

## 2019-09-12 ENCOUNTER — Telehealth: Payer: Self-pay | Admitting: Urology

## 2019-09-12 NOTE — Telephone Encounter (Signed)
Pt states that he is having some burning at the tip of his penis, it is not with urination it is more intermittent. He states that he has had this in the past and would like to either do a virtual visit or have you call him due to [place].Please advise.

## 2019-09-12 NOTE — Telephone Encounter (Signed)
Is Mr. Baldi taking the tamsulosin and finasteride?

## 2019-09-16 NOTE — Telephone Encounter (Signed)
I called pt and LMOM to schedule an appt.

## 2019-09-16 NOTE — Telephone Encounter (Signed)
Unfortunately, Timothy Scott symptoms are not amenable to a virtual visit as he has a history of microscopic blood in the urine, holding back urine in his bladder and an obstructive prostate on cystoscopy in the past.  He will need to have an in-office visit, so that we can check his urine and scan his bladder.

## 2019-09-25 ENCOUNTER — Other Ambulatory Visit: Payer: Self-pay | Admitting: Internal Medicine

## 2019-09-25 DIAGNOSIS — R1319 Other dysphagia: Secondary | ICD-10-CM

## 2019-09-27 ENCOUNTER — Ambulatory Visit: Payer: PPO | Attending: Internal Medicine

## 2019-09-27 DIAGNOSIS — Z20822 Contact with and (suspected) exposure to covid-19: Secondary | ICD-10-CM | POA: Diagnosis not present

## 2019-09-28 LAB — NOVEL CORONAVIRUS, NAA: SARS-CoV-2, NAA: NOT DETECTED

## 2019-10-01 ENCOUNTER — Encounter: Payer: Self-pay | Admitting: Urology

## 2019-10-04 ENCOUNTER — Ambulatory Visit: Payer: PPO | Admitting: Urology

## 2019-10-10 ENCOUNTER — Other Ambulatory Visit: Payer: Self-pay

## 2019-10-10 ENCOUNTER — Ambulatory Visit: Payer: PPO | Admitting: Urology

## 2019-10-10 ENCOUNTER — Encounter: Payer: Self-pay | Admitting: Urology

## 2019-10-10 VITALS — BP 118/67 | HR 66 | Ht 71.0 in | Wt 162.0 lb

## 2019-10-10 DIAGNOSIS — N401 Enlarged prostate with lower urinary tract symptoms: Secondary | ICD-10-CM | POA: Diagnosis not present

## 2019-10-10 DIAGNOSIS — R3129 Other microscopic hematuria: Secondary | ICD-10-CM

## 2019-10-10 DIAGNOSIS — Z87448 Personal history of other diseases of urinary system: Secondary | ICD-10-CM | POA: Diagnosis not present

## 2019-10-10 LAB — BLADDER SCAN AMB NON-IMAGING: Scan Result: 0

## 2019-10-10 NOTE — Progress Notes (Signed)
10/10/2019 6:03 PM   Timothy Scott February 16, 1946 [id number]  Referring provider: Glean Hess, MD [address] [place],  [place] [address]  Chief Complaint  Patient presents with  . Benign Prostatic Hypertrophy    follow up    HPI: Timothy Scott is a 74 year old male with a history of hematuria and BPH with LUTS who presents today for burning at the tip of his penis.  History of hematuria (high risk) Former smoker.  CTU 10/2017 Normal adrenal glands.  No urinary tract calculi identified. The kidneys appear unremarkable. No mass or hydronephrosis. The ureters appear patent.  Mild circumferential wall thickening of the urinary bladder.  The prostate gland contains multiple the calcifications. Measures 4.8 by 4.1 by 4.2 cm (volume = 43 cm^3).  Cysto 11/2017 with Dr. Pilar Jarvis indicated No urethral strictures/lesions are present.  Enlarged prostate Visually obstructive ~5 cm in length.  Normal bladder neck.  Bilateral ureteral orifices identified.  Bladder mucosa  reveals no ulcers, tumors, or lesions.  No bladder stones.  Mild trabeculation with small saccules in the posterior well.  Retroflexion shows intravesical lobe noted  He denies any gross hematuria.  UA today (10/10/2019) with 3-10 RBC's.    BPH WITH LUTS  (prostate and/or bladder) IPSS score: 15/3    PVR: 0 mL  Previous PVR: 97 mL   Major complaint(s): Burning at the tip of penis off and on for several years.  Denies any dysuria, hematuria or suprapubic pain.   Currently taking: finasteride 5 mg and tamsulosin 0.4 mg daily.  Denies any recent fevers, chills, nausea or vomiting.  He does not have a family history of PCa.  IPSS    Row Name 10/10/19 0800         International Prostate Symptom Score   How often have you had the sensation of not emptying your bladder?  Less than half the time     How often have you had to urinate less than every two hours?  Less than half the time     How often have you found you  stopped and started again several times when you urinated?  Less than half the time     How often have you found it difficult to postpone urination?  Less than half the time     How often have you had a weak urinary stream?  Less than half the time     How often have you had to strain to start urination?  Less than half the time     How many times did you typically get up at night to urinate?  3 Times     Total IPSS Score  15       Quality of Life due to urinary symptoms   If you were to spend the rest of your life with your urinary condition just the way it is now how would you feel about that?  Mixed        Score:  1-7 Mild 8-19 Moderate 20-35 Severe  PMH: Past Medical History:  Diagnosis Date  . Benign neoplasm of colon   . Chronic airway obstruction, not elsewhere classified   . DDD (degenerative disc disease), cervical   . Degeneration of cervical intervertebral disc   . Dysphagia   . Dysuria   . Hyperlipidemia   . 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 Lattice degeneration of peripheral retina   . Leg varices 03/10/2015  .  Osteoarthrosis, unspecified whether generalized or localized, hand   . Posttraumatic stress disorder   . Sinus bradycardia 04/17/2014  . Varicose veins of bilateral lower extremities with other complications   . Wears dentures    partial upper and lower    Surgical History: Past Surgical History:  Procedure Laterality Date  . CATARACT EXTRACTION Right 2015  . CATARACT EXTRACTION W/PHACO Left 10/21/2015   Procedure: CATARACT EXTRACTION PHACO AND INTRAOCULAR LENS PLACEMENT (IOC);  Surgeon: Leandrew Koyanagi, MD;  Location: [facility];  Service: Ophthalmology;  Laterality: Left;  . CATARACT EXTRACTION, BILATERAL  2016  . COLONOSCOPY  2014   benign polyps  . CYSTOSCOPY    . ESOPHAGEAL MANOMETRY N/A 08/03/2016   Procedure: ESOPHAGEAL MANOMETRY (EM);  Surgeon: Mauri Pole, MD;  Location: [facility];   Service: Endoscopy;  Laterality: N/A;  . ESOPHAGOGASTRODUODENOSCOPY (EGD) WITH PROPOFOL N/A 02/17/2016   with esoph dilatation  . TONSILLECTOMY      Home Medications:  Allergies as of 10/10/2019   No Known Allergies     Medication List       Accurate as of October 10, 2019 11:59 PM. If you have any questions, ask your nurse or doctor.        STOP taking these medications   loratadine 10 MG tablet Commonly known as: CLARITIN Stopped by: Zara Council, PA-C     TAKE these medications   aspirin 81 MG EC tablet Take 1 tablet (81 mg total) by mouth daily.   atorvastatin 10 MG tablet Commonly known as: LIPITOR Take 1 tablet (10 mg total) by mouth daily.   baclofen 10 MG tablet Commonly known as: LIORESAL TAKE 1/2 TABLET THREE TIMES A DAY   CALCIUM 1200 PO Take 1 tablet by mouth daily.   Dexilant 60 MG capsule Generic drug: dexlansoprazole Take 1 capsule (60 mg total) by mouth daily.   finasteride 5 MG tablet Commonly known as: PROSCAR Take 5 mg by mouth daily.   fluticasone 50 MCG/ACT nasal spray Commonly known as: FLONASE Place 2 sprays into both nostrils daily as needed for allergies or rhinitis.   gabapentin 100 MG capsule Commonly known as: NEURONTIN Take 1-3 capsules (100-300 mg total) by mouth at bedtime.   glucosamine-chondroitin 500-400 MG tablet Take 1 tablet by mouth daily.   Imodium A-D 2 MG capsule Generic drug: loperamide Take 2-4 mg by mouth as needed (loose stools).   naproxen 500 MG tablet Commonly known as: NAPROSYN Take 500 mg by mouth 2 (two) times daily as needed for mild pain or moderate pain.   omeprazole 40 MG capsule Commonly known as: PRILOSEC Take 40 mg by mouth daily.   Probiotic Daily Caps Take 1 capsule by mouth daily.   Saw Palmetto 450 MG Caps Take 450 mg by mouth daily.   tamsulosin 0.4 MG Caps capsule Commonly known as: FLOMAX Take 0.4 mg by mouth every evening.   traZODone 50 MG tablet Commonly known as:  DESYREL Take 50 mg by mouth at bedtime.       Allergies: No Known Allergies  Family History: Family History  Problem Relation Age of Onset  . Hypertension Mother   . Cancer Mother        breast  . Aneurysm Mother   . Healthy Sister   . Prostate cancer Neg Hx   . Kidney cancer Neg Hx   . Bladder Cancer Neg Hx     Social History:  reports that he quit smoking about 33 years ago. His  smoking use included cigarettes, pipe, and cigars. He has a 10.00 pack-year smoking history. He has never used smokeless tobacco. He reports current alcohol use of about 14.0 standard drinks of alcohol per week. He reports that he does not use drugs.  ROS: UROLOGY Frequent Urination?: No Hard to postpone urination?: No Burning/pain with urination?: Yes Get up at night to urinate?: No Leakage of urine?: No Urine stream starts and stops?: No Trouble starting stream?: No Do you have to strain to urinate?: No Blood in urine?: No Urinary tract infection?: No Sexually transmitted disease?: No Injury to kidneys or bladder?: No Painful intercourse?: No Weak stream?: No Erection problems?: No Penile pain?: No  Gastrointestinal Nausea?: No Vomiting?: No Indigestion/heartburn?: No Diarrhea?: No Constipation?: No  Constitutional Fever: No Night sweats?: No Weight loss?: No Fatigue?: No  Skin Skin rash/lesions?: No Itching?: No  Eyes Blurred vision?: No Double vision?: No  Ears/Nose/Throat Sore throat?: No Sinus problems?: No  Hematologic/Lymphatic Swollen glands?: Yes Easy bruising?: No  Cardiovascular Leg swelling?: No Chest pain?: No  Respiratory Cough?: No Shortness of breath?: No  Endocrine Excessive thirst?: No  Musculoskeletal Back pain?: No Joint pain?: No  Neurological Headaches?: No Dizziness?: Yes  Psychologic Depression?: No Anxiety?: No  Physical Exam: BP 118/67   Pulse 66   Ht 5\' 11"  (1.803 m)   Wt 162 lb (73.5 kg)   BMI 22.59 kg/m    Constitutional:  Well nourished. Alert and oriented, No acute distress. HEENT: [place] AT, mask in place.  Trachea midline, no masses. Cardiovascular: No clubbing, cyanosis, or edema. Respiratory: Normal respiratory effort, no increased work of breathing. GI: Abdomen is soft, non tender, non distended, no abdominal masses. Liver and spleen not palpable.  No hernias appreciated.  Stool sample for occult testing is not indicated.   GU: No CVA tenderness.  No bladder fullness or masses.  Patient with circumcised phallus.  Urethral meatus is patent.  No penile discharge. No penile lesions or rashes. Scrotum without lesions, cysts, rashes and/or edema.  Testicles are located scrotally bilaterally. No masses are appreciated in the testicles. Left and right epididymis are normal. Rectal: Patient with  normal sphincter tone. Anus and perineum without scarring or rashes. No rectal masses are appreciated. Prostate is approximately 50 grams, no nodules are appreciated. Seminal vesicles could not be palpated. Skin: No rashes, bruises or suspicious lesions. Lymph: No inguinal adenopathy. Neurologic: Grossly intact, no focal deficits, moving all 4 extremities. Psychiatric: Normal mood and affect.  Laboratory Data: Component     Latest Ref Rng & Units 02/23/2017 05/09/2019  Prostate Specific Ag, Serum     0.0 - 4.0 ng/mL 0.2 0.2   Lab Results  Component Value Date   WBC 6.7 06/01/2019   HGB 14.1 06/01/2019   HCT 40.4 06/01/2019   MCV 95.3 06/01/2019   PLT 271 06/01/2019    Lab Results  Component Value Date   CREATININE 0.68 06/01/2019    Lab Results  Component Value Date   HGBA1C 5.1 06/01/2019    Lab Results  Component Value Date   TSH 1.710 05/09/2019       Component Value Date/Time   CHOL 152 06/01/2019 0435   CHOL 149 05/09/2019 1032   HDL 65 06/01/2019 0435   HDL 60 05/09/2019 1032   CHOLHDL 2.3 06/01/2019 0435   VLDL 10 06/01/2019 0435   LDLCALC 77 06/01/2019 0435   LDLCALC 61  05/09/2019 1032    Lab Results  Component Value Date   AST  23 05/31/2019   Lab Results  Component Value Date   ALT 22 05/31/2019    Urinalysis Results for orders placed or performed in visit on 10/10/19  CULTURE, URINE COMPREHENSIVE   Specimen: Urine   UR  Result Value Ref Range   Urine Culture, Comprehensive Final report    Organism ID, Bacteria Comment   Microscopic Examination   URINE  Result Value Ref Range   WBC, UA 0-5 0 - 5 /hpf   RBC 3-10 (A) 0 - 2 /hpf   Epithelial Cells (non renal) 0-10 0 - 10 /hpf   Casts Present (A) None seen /lpf   Cast Type Hyaline casts N/A   Bacteria, UA None seen None seen/Few  BLADDER SCAN AMB NON-IMAGING  Result Value Ref Range   Scan Result 0     I have reviewed the labs.   Pertinent Imaging: Results for EVER, COLOMBE (MRN [id number]) as of 10/10/2019 08:42  Ref. Range 10/10/2019 08:31  Scan Result Unknown 48ml     Assessment & Plan:    1. Microscopic hematuria 3-10 RBC's on today's UA  Discussed the AUA guidelines regarding follow up after undergoing a hematuria workup with persistent microscopic hematuria.  He has had had cystoscopy x 2 (2014 and 2019) and no malignancy was seen.  He had a CT Urogram in 2019 which was negative.  He has no family history of kidney, bladder or prostate cancer.  He is not aware of any family hereditary conditions, such as Lynch syndrome at this time.  He has had 3-10 RBC's from time to time on UA since 2014.  He denied any gross hematuria.  His options at this time would be to repeat the CT urogram and cystoscopy, obtain a RUS and cystoscopy or not to pursue more testing until he experiences gross hematuria or worsening of the burning sensation at the tip of his penis, he has had this symptom off and on for years as well.  I will send the urine for culture, but I am doubtful there is infection.  Mr. Schnetzer will consider his options over the weekend and let me know how he would like to proceed moving  forward.    2. History of hematuria Hematuria work up completed in 11/2017 - findings positive for enlarged prostate, visually obstructive ~ 5 cm in length, mild trabeculation with small saccules in the posterior wall and intravesical lobe No report of gross hematuria  UA today 3-10 RBC's RTC in one year for UA - patient to report any gross hematuria in the interim    3. BPH with LUTS IPSS score is 15/3 Continue conservative management, avoiding bladder irritants and timed voiding's Most bothersome symptoms is/are burning at the tip of his penis Continue tamsulosin 0.4 mg daily and finasteride 5 mg daily Discussed his cystoscopy findings from 2019 with Dr. Pilar Jarvis and that his medications have likely reached their maximum therapy and it would be beneficial to consider a bladder outlet procedure at this time RTC in 12 months for IPS'S, PVR and exam    Return for pending  urine culture .  These notes generated with voice recognition software. I apologize for typographical errors.  Zara Council, PA-C  [facility] [address]  [place], [place] [address] [phone]

## 2019-10-11 LAB — MICROSCOPIC EXAMINATION: Bacteria, UA: NONE SEEN

## 2019-10-13 LAB — CULTURE, URINE COMPREHENSIVE

## 2019-10-14 ENCOUNTER — Telehealth: Payer: Self-pay | Admitting: Family Medicine

## 2019-10-14 DIAGNOSIS — R3129 Other microscopic hematuria: Secondary | ICD-10-CM

## 2019-10-14 NOTE — Telephone Encounter (Signed)
-----   Message from Nori Riis, PA-C sent at 10/14/2019  8:00 AM EST ----- Please let Mr. Ciocca know that his urine culture was negative.  I suggest having a renal ultrasound due his microscopic hematuria.

## 2019-10-14 NOTE — Telephone Encounter (Signed)
Patient notified and voiced understanding. The RUS has been ordered.

## 2019-10-21 ENCOUNTER — Ambulatory Visit
Admission: RE | Admit: 2019-10-21 | Discharge: 2019-10-21 | Disposition: A | Discharge status: Home / Self Care | Payer: PPO | Source: Ambulatory Visit | Attending: Urology | Admitting: Urology

## 2019-10-21 ENCOUNTER — Other Ambulatory Visit: Payer: Self-pay

## 2019-10-21 DIAGNOSIS — R3129 Other microscopic hematuria: Secondary | ICD-10-CM | POA: Diagnosis not present

## 2019-10-29 ENCOUNTER — Telehealth: Payer: Self-pay | Admitting: Urology

## 2019-10-29 NOTE — Telephone Encounter (Signed)
Pt. Would like for someone to call him and let him know the plans moving forward following his recent RUS.He received results through [facility] and would like a call to discuss.

## 2019-10-30 DIAGNOSIS — Z23 Encounter for immunization: Secondary | ICD-10-CM | POA: Diagnosis not present

## 2019-11-04 ENCOUNTER — Telehealth: Payer: Self-pay | Admitting: Family Medicine

## 2019-11-04 NOTE — Telephone Encounter (Signed)
Spoke to patient and he states he talked to [facility] this morning and is not sure if he wants the Cysto.

## 2019-11-04 NOTE — Telephone Encounter (Signed)
-----   Message from Nori Riis, PA-C sent at 11/04/2019  9:25 AM EST ----- [name] will need to have a cystoscopy at this time to complete his hematuria work up.

## 2019-11-05 ENCOUNTER — Telehealth: Payer: Self-pay | Admitting: Family Medicine

## 2019-11-05 NOTE — Telephone Encounter (Signed)
LMOM notified Urolift is done in OR.

## 2019-11-05 NOTE — Telephone Encounter (Signed)
-----   Message from Nori Riis, PA-C sent at 11/04/2019  1:01 PM EST ----- Please let Mr. Ice know that the UroLift is not performed in the office.  It is done in the OR.

## 2019-11-12 ENCOUNTER — Ambulatory Visit: Payer: PPO | Admitting: Internal Medicine

## 2019-11-20 DIAGNOSIS — H2000 Unspecified acute and subacute iridocyclitis: Secondary | ICD-10-CM | POA: Diagnosis not present

## 2019-11-28 DIAGNOSIS — H2000 Unspecified acute and subacute iridocyclitis: Secondary | ICD-10-CM | POA: Diagnosis not present

## 2019-11-28 NOTE — Progress Notes (Signed)
   11/29/19  CC:  Chief Complaint  Patient presents with  . Cysto    HPI: 74 yo M presents today for a cystoscopy for the evaluation and management of microscopic hematuria.   -UA with microscopic blood -no bothersome urinary symptoms  -denies gross hematuria  -last cysto 2 years ago  -Past hx of smoking   There were no vitals taken for this visit. NED. A&Ox3.   No respiratory distress   Abd soft, NT, ND Normal phallus with bilateral descended testicles  Cystoscopy Procedure Note  Patient identification was confirmed, informed consent was obtained, and patient was prepped using Betadine solution.  Lidocaine jelly was administered per urethral meatus.    Pre-Procedure: - Inspection reveals a normal caliber urethral meatus.  Procedure: The flexible cystoscope was introduced without difficulty - No urethral strictures/lesions are present. -Marked lateral lobe enlargement of prostate gland, hypervascular  - Moderate bladder neck elevation - Bilateral ureteral orifices identified - Bladder mucosa  reveals no ulcers, tumors, or lesions - No bladder stones - Moderate trabeculation with cellules  Retroflexion shows intravesical median lobe.    Post-Procedure: - Patient tolerated the procedure well  Assessment/ Plan:  1. BPH   Enlarged prostate on cysto  No bothersome urinary symptoms reported today   2. Microscopic hematuria  No significant mucosal abnormalities on cysto  Hematuria most likely secondary to BPH  F/u in 6 months with [facility] PA-C   Abbie Sons, MD  I, Timothy Scott, am acting as a scribe for Dr. Nicki Reaper C. Ronnel Zuercher,  I have reviewed the above documentation for accuracy and completeness, and I agree with the above.   Abbie Sons, MD

## 2019-11-29 ENCOUNTER — Ambulatory Visit: Payer: PPO | Admitting: Urology

## 2019-11-29 ENCOUNTER — Encounter: Payer: Self-pay | Admitting: Urology

## 2019-11-29 ENCOUNTER — Other Ambulatory Visit: Payer: Self-pay

## 2019-11-29 VITALS — BP 138/71 | HR 53 | Ht 71.0 in | Wt 163.0 lb

## 2019-11-29 DIAGNOSIS — R3129 Other microscopic hematuria: Secondary | ICD-10-CM | POA: Diagnosis not present

## 2019-11-29 LAB — URINALYSIS, COMPLETE
Bilirubin, UA: NEGATIVE
Glucose, UA: NEGATIVE
Ketones, UA: NEGATIVE
Nitrite, UA: NEGATIVE
Protein,UA: NEGATIVE
Specific Gravity, UA: 1.02 (ref 1.005–1.030)
Urobilinogen, Ur: 0.2 mg/dL (ref 0.2–1.0)
pH, UA: 7 (ref 5.0–7.5)

## 2019-11-29 LAB — MICROSCOPIC EXAMINATION
Bacteria, UA: NONE SEEN
Epithelial Cells (non renal): NONE SEEN /hpf (ref 0–10)

## 2019-12-09 ENCOUNTER — Encounter: Payer: Self-pay | Admitting: Gastroenterology

## 2019-12-09 ENCOUNTER — Other Ambulatory Visit: Payer: Self-pay

## 2019-12-09 ENCOUNTER — Ambulatory Visit (INDEPENDENT_AMBULATORY_CARE_PROVIDER_SITE_OTHER): Payer: PPO | Admitting: Gastroenterology

## 2019-12-09 VITALS — BP 130/71 | HR 65 | Temp 97.9°F | Ht 71.0 in | Wt 165.2 lb

## 2019-12-09 DIAGNOSIS — R1319 Other dysphagia: Secondary | ICD-10-CM | POA: Diagnosis not present

## 2019-12-09 MED ORDER — SUPREP BOWEL PREP KIT 17.5-3.13-1.6 GM/177ML PO SOLN: 1.0000 | ORAL | 0 refills | Status: DC

## 2019-12-09 NOTE — Progress Notes (Signed)
Primary Care Physician: Glean Hess, MD  Primary Gastroenterologist:  Dr. [name]  Chief Complaint  Patient presents with  . Follow-up    HPI: Timothy Scott is a 74 y.o. male here for follow-up with a history of dysphagia and an extensive work-up in the past.  The patient has had an upper endoscopy with dilation by Dr. Vira Agar.  The patient also had a modified barium swallow that showed "In the cervical esophagus there is slight narrowing impingement by osteophytes.  However all boluses flow easily through the cervical esophagus."  The patient was tried on Dexilant when he last saw me in 2020.  The modified barium swallow was otherwise normal.  The patient's history has consistently been a feeling of globus in the upper esophagus/throat.  The patient is also reported to be in need of a colonoscopy. Patient reports that he is not having any dysphagia to foods or liquids but states that he has pain at the base of his tongue.  There is nothing that he can cite that makes the symptoms any better or worse.  He also reports that he has lost 8 pounds without trying in the last 6 months and is not sure if it is because he stopped going to the gym and working out.  Past Medical History:  Diagnosis Date  . Benign neoplasm of colon   . Chronic airway obstruction, not elsewhere classified   . DDD (degenerative disc disease), cervical   . Degeneration of cervical intervertebral disc   . Dysphagia   . Dysuria   . Hyperlipidemia   . 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 Lattice degeneration of peripheral retina   . Leg varices 03/10/2015  . Osteoarthrosis, unspecified whether generalized or localized, hand   . Posttraumatic stress disorder   . Sinus bradycardia 04/17/2014  . Varicose veins of bilateral lower extremities with other complications   . Wears dentures    partial upper and lower    Current Outpatient Medications  Medication Sig Dispense Refill   . Calcium Carbonate-Vit D-Min (CALCIUM 1200 PO) Take 1 tablet by mouth daily.     . finasteride (PROSCAR) 5 MG tablet Take 5 mg by mouth daily.    Marland Kitchen glucosamine-chondroitin 500-400 MG tablet Take 1 tablet by mouth daily.    . naproxen (NAPROSYN) 500 MG tablet Take 500 mg by mouth 2 (two) times daily as needed for mild pain or moderate pain.     Marland Kitchen omeprazole (PRILOSEC) 40 MG capsule Take 40 mg by mouth daily.     . Probiotic Product (PROBIOTIC DAILY) CAPS Take 1 capsule by mouth daily.     . Saw Palmetto 450 MG CAPS Take 450 mg by mouth daily.     . tamsulosin (FLOMAX) 0.4 MG CAPS capsule Take 0.4 mg by mouth every evening.     . traZODone (DESYREL) 50 MG tablet Take 50 mg by mouth at bedtime.    Marland Kitchen aspirin EC 81 MG EC tablet Take 1 tablet (81 mg total) by mouth daily. (Patient not taking: Reported on 12/09/2019) 30 tablet 0  . atorvastatin (LIPITOR) 10 MG tablet Take 1 tablet (10 mg total) by mouth daily. (Patient not taking: Reported on 12/09/2019) 90 tablet 1  . baclofen (LIORESAL) 10 MG tablet TAKE 1/2 TABLET THREE TIMES A DAY (Patient not taking: Reported on 12/09/2019) 45 each 5  . dexlansoprazole (DEXILANT) 60 MG capsule Take 1 capsule (60 mg total) by mouth daily. (Patient  not taking: Reported on 12/09/2019) 90 capsule 1  . fluticasone (FLONASE) 50 MCG/ACT nasal spray Place 2 sprays into both nostrils daily as needed for allergies or rhinitis.     Marland Kitchen gabapentin (NEURONTIN) 100 MG capsule Take 1-3 capsules (100-300 mg total) by mouth at bedtime. (Patient not taking: Reported on 12/09/2019) 90 capsule 0  . loperamide (IMODIUM A-D) 2 MG capsule Take 2-4 mg by mouth as needed (loose stools).     . Na Sulfate-K Sulfate-Mg Sulf (SUPREP BOWEL PREP KIT) 17.5-3.13-1.6 GM/177ML SOLN Take 1 kit by mouth as directed. 354 mL 0   No current facility-administered medications for this visit.    Allergies as of 12/09/2019  . (No Known Allergies)    ROS:  General: Negative for anorexia, weight loss, fever,  chills, fatigue, weakness. ENT: Negative for hoarseness, difficulty swallowing , nasal congestion. CV: Negative for chest pain, angina, palpitations, dyspnea on exertion, peripheral edema.  Respiratory: Negative for dyspnea at rest, dyspnea on exertion, cough, sputum, wheezing.  GI: See history of present illness. GU:  Negative for dysuria, hematuria, urinary incontinence, urinary frequency, nocturnal urination.  Endo: Negative for unusual weight change.    Physical Examination:   BP 130/71   Pulse 65   Temp 97.9 F (36.6 C) (Temporal)   Ht 5' 11"  (1.803 m)   Wt 165 lb 3.2 oz (74.9 kg)   BMI 23.04 kg/m   General: Well-nourished, well-developed in no acute distress.  Eyes: No icterus. Conjunctivae pink. Lungs: Clear to auscultation bilaterally. Non-labored. Heart: Regular rate and rhythm, no murmurs rubs or gallops.  Abdomen: Bowel sounds are normal, nontender, nondistended, no hepatosplenomegaly or masses, no abdominal bruits or hernia , no rebound or guarding.   Extremities: No lower extremity edema. No clubbing or deformities. Neuro: Alert and oriented x 3.  Grossly intact. Skin: Warm and dry, no jaundice.   Psych: Alert and cooperative, normal mood and affect.  Labs:    Imaging Studies: No results found.  Assessment and Plan:   Timothy Scott is a 74 y.o. y/o male who comes in today with throat/submandibular pain.  The patient has had an esophageal dilation in the past by Dr. Vira Agar but does not report that he recalls if it made his symptoms any better or worse since he states his symptoms have been only present for the last 3 years.  He reports that it is constantly uncomfortable.  He will be set up for an EGD and colonoscopy and the colonoscopy will be done for screening purposes.  The patient will also be set up to see ENT for his neck discomfort.  The patient has been explained the plan and agrees with it.     [name], MD. Marval Regal    Note: This dictation was  prepared with Dragon dictation along with smaller phrase technology. Any transcriptional errors that result from this process are unintentional.

## 2019-12-12 ENCOUNTER — Encounter: Admission: RE | Payer: Self-pay | Source: Home / Self Care

## 2019-12-12 ENCOUNTER — Ambulatory Visit: Admission: RE | Admit: 2019-12-12 | Payer: PPO | Source: Home / Self Care | Admitting: Gastroenterology

## 2019-12-12 SURGERY — COLONOSCOPY WITH PROPOFOL
Anesthesia: General

## 2019-12-17 DIAGNOSIS — J301 Allergic rhinitis due to pollen: Secondary | ICD-10-CM | POA: Diagnosis not present

## 2019-12-17 DIAGNOSIS — F458 Other somatoform disorders: Secondary | ICD-10-CM | POA: Diagnosis not present

## 2019-12-17 DIAGNOSIS — K219 Gastro-esophageal reflux disease without esophagitis: Secondary | ICD-10-CM | POA: Diagnosis not present

## 2020-01-28 ENCOUNTER — Ambulatory Visit (INDEPENDENT_AMBULATORY_CARE_PROVIDER_SITE_OTHER): Payer: PPO | Admitting: Internal Medicine

## 2020-01-28 ENCOUNTER — Encounter: Payer: Self-pay | Admitting: Internal Medicine

## 2020-01-28 ENCOUNTER — Other Ambulatory Visit: Payer: Self-pay

## 2020-01-28 VITALS — BP 124/58 | HR 63 | Temp 98.0°F | Ht 71.0 in | Wt 163.0 lb

## 2020-01-28 DIAGNOSIS — I495 Sick sinus syndrome: Secondary | ICD-10-CM

## 2020-01-28 DIAGNOSIS — Z8673 Personal history of transient ischemic attack (TIA), and cerebral infarction without residual deficits: Secondary | ICD-10-CM | POA: Insufficient documentation

## 2020-01-28 DIAGNOSIS — R1319 Other dysphagia: Secondary | ICD-10-CM

## 2020-01-28 MED ORDER — GABAPENTIN 100 MG PO CAPS: 300.0000 mg | ORAL_CAPSULE | Freq: Three times a day (TID) | ORAL | 0 refills | Status: DC

## 2020-01-28 NOTE — Patient Instructions (Signed)
Start gabapentin 100 mg three times a day for one week then 200 mg three times a day for one week then 300 mg three times a day until follow up  Aspirin 81 mg MWF every week

## 2020-01-28 NOTE — Progress Notes (Signed)
Date:  01/28/2020   Name:  Timothy Scott   DOB:  06-22-1946   MRN:  [id number]   Chief Complaint: Throat Issue (Ongoing issue last few years. Feels like squeezing in throat. No pain. Surgeon in [place] said it could be from a bone spur in the spin. Will schedule appt with Dr Allen Norris for Enooscopy and Colonoscopy.)  Dizziness This is a recurrent problem. The problem occurs intermittently. The problem has been unchanged. Pertinent negatives include no chest pain, chills, fatigue, fever, myalgias, visual change, vomiting or weakness. Exacerbated by: rubbing his anterior neck and carotid area. He has tried nothing for the symptoms.   Choking sensation - long standing symptoms have been fully evaluated with no cause found.  Small bone spur in neck not likely to help sx if removed.  Tried gabapentin, baclofen without benefit but now pt says that he might not have really given them a good try.  He never chokes while eating but often gets strangled when not swallowing anything.  TIA - had a TIA last fall with arm weakness.  He was discharged on aspirin and lipitor, he declined Plavix.  Carotid US showed minimal plaque with no hemodynamically significant stenosis.  He did not continue aspirin (due to fragile skin interpreted as thin blood) or lipitor (unknown reason).  Lab Results  Component Value Date   CREATININE 0.68 06/01/2019   BUN 13 06/01/2019   NA 142 06/01/2019   K 3.9 06/01/2019   CL 107 06/01/2019   CO2 27 06/01/2019   Lab Results  Component Value Date   CHOL 152 06/01/2019   HDL 65 06/01/2019   LDLCALC 77 06/01/2019   TRIG 48 06/01/2019   CHOLHDL 2.3 06/01/2019   Lab Results  Component Value Date   TSH 1.710 05/09/2019   Lab Results  Component Value Date   HGBA1C 5.1 06/01/2019   Lab Results  Component Value Date   WBC 6.7 06/01/2019   HGB 14.1 06/01/2019   HCT 40.4 06/01/2019   MCV 95.3 06/01/2019   PLT 271 06/01/2019   Lab Results  Component Value Date   ALT  22 05/31/2019   AST 23 05/31/2019   ALKPHOS 43 05/31/2019   BILITOT 0.9 05/31/2019     Review of Systems  Constitutional: Negative for chills, fatigue and fever.  HENT: Positive for trouble swallowing. Negative for voice change.   Respiratory: Positive for choking. Negative for chest tightness, shortness of breath and wheezing.   Cardiovascular: Negative for chest pain, palpitations and leg swelling.  Gastrointestinal: Negative for vomiting.  Musculoskeletal: Negative for myalgias.  Neurological: Positive for dizziness and light-headedness. Negative for tremors, seizures, syncope and weakness.    Patient Active Problem List   Diagnosis Date Noted  . Hx of transient ischemic attack (TIA) 01/28/2020  . Elevated left ventricular end-diastolic pressure (LVEDP) 06/01/2019  . Moderate aortic regurgitation 06/01/2019  . TIA (transient ischemic attack) 05/31/2019  . Mood disturbance 02/21/2019  . Weight loss 02/21/2019  . Carotid artery plaque, left 07/09/2018  . Sick sinus syndrome ([place]) 03/09/2017  . Vertigo 12/12/2016  . Anosmia 05/05/2016  . Dysphagia 02/03/2016  . Tobacco use disorder, moderate, in sustained remission 03/11/2015  . Lumbar disc herniation with radiculopathy 03/11/2015  . Gonalgia 03/10/2015  . DDD (degenerative disc disease), cervical 03/10/2015  . BPH with obstruction/lower urinary tract symptoms 03/10/2015  . Degenerative arthritis of finger 03/10/2015  . Post-traumatic stress disorder 03/10/2015  . Lattice degeneration 03/10/2015  . Leg varices 03/10/2015  .  Sinus bradycardia 04/17/2014    No Known Allergies  Past Surgical History:  Procedure Laterality Date  . CATARACT EXTRACTION Right 2015  . CATARACT EXTRACTION W/PHACO Left 10/21/2015   Procedure: CATARACT EXTRACTION PHACO AND INTRAOCULAR LENS PLACEMENT (IOC);  Surgeon: Leandrew Koyanagi, MD;  Location: [place];  Service: Ophthalmology;  Laterality: Left;  . CATARACT EXTRACTION,  BILATERAL  2016  . COLONOSCOPY  2014   benign polyps  . CYSTOSCOPY    . ESOPHAGEAL MANOMETRY N/A 08/03/2016   Procedure: ESOPHAGEAL MANOMETRY (EM);  Surgeon: Mauri Pole, MD;  Location: [facility];  Service: Endoscopy;  Laterality: N/A;  . ESOPHAGOGASTRODUODENOSCOPY (EGD) WITH PROPOFOL N/A 02/17/2016   with esoph dilatation  . TONSILLECTOMY      Social History   Tobacco Use  . Smoking status: Former Smoker    Packs/day: 0.50    Years: 20.00    Pack years: 10.00    Types: Cigarettes, Pipe, Cigars    Quit date: 04/05/1986    Years since quitting: 33.8  . Smokeless tobacco: Never Used  . Tobacco comment: smoking cessation materials not required  Substance Use Topics  . Alcohol use: Yes    Alcohol/week: 14.0 standard drinks    Types: 14 Standard drinks or equivalent per week    Comment: socially  . Drug use: No     Medication list has been reviewed and updated.  Current Meds  Medication Sig  . Calcium Carbonate-Vit D-Min (CALCIUM 1200 PO) Take 1 tablet by mouth daily.   . finasteride (PROSCAR) 5 MG tablet Take 5 mg by mouth daily.  . fluticasone (FLONASE) 50 MCG/ACT nasal spray Place 2 sprays into both nostrils daily as needed for allergies or rhinitis.   Marland Kitchen glucosamine-chondroitin 500-400 MG tablet Take 1 tablet by mouth daily.  Marland Kitchen loperamide (IMODIUM A-D) 2 MG capsule Take 2-4 mg by mouth as needed (loose stools).   . naproxen (NAPROSYN) 500 MG tablet Take 500 mg by mouth 2 (two) times daily as needed for mild pain or moderate pain.   Marland Kitchen omeprazole (PRILOSEC) 40 MG capsule Take 40 mg by mouth daily.   . Probiotic Product (PROBIOTIC DAILY) CAPS Take 1 capsule by mouth daily.   . Saw Palmetto 450 MG CAPS Take 450 mg by mouth daily.   . tamsulosin (FLOMAX) 0.4 MG CAPS capsule Take 0.4 mg by mouth every evening.   . traZODone (DESYREL) 50 MG tablet Take 50 mg by mouth at bedtime.    PHQ 2/9 Scores 01/28/2020 05/09/2019 04/15/2019 04/01/2019  PHQ - 2 Score 0 4 2 4   PHQ- 9  Score 0 11 10 11     BP Readings from Last 3 Encounters:  01/28/20 (!) 124/58  12/09/19 130/71  11/29/19 138/71    Physical Exam Vitals and nursing note reviewed.  Constitutional:      General: He is not in acute distress.    Appearance: He is well-developed.  HENT:     Head: Normocephalic and atraumatic.  Neck:     Vascular: No carotid bruit.  Cardiovascular:     Rate and Rhythm: Regular rhythm. Bradycardia present.     Pulses: Normal pulses.     Heart sounds: No murmur.  Pulmonary:     Effort: Pulmonary effort is normal. No respiratory distress.     Breath sounds: No wheezing.  Musculoskeletal:     Right lower leg: No edema.     Left lower leg: No edema.  Lymphadenopathy:     Cervical: No cervical adenopathy.  Skin:    General: Skin is warm and dry.     Capillary Refill: Capillary refill takes less than 2 seconds.     Findings: No rash.  Neurological:     General: No focal deficit present.     Mental Status: He is alert and oriented to person, place, and time.  Psychiatric:        Behavior: Behavior normal.        Thought Content: Thought content normal.     Wt Readings from Last 3 Encounters:  01/28/20 163 lb (73.9 kg)  12/09/19 165 lb 3.2 oz (74.9 kg)  11/29/19 163 lb (73.9 kg)    BP (!) 124/58   Pulse 63   Temp 98 F (36.7 C) (Oral)   Ht 5\' 11"  (1.803 m)   Wt 163 lb (73.9 kg)   SpO2 98%   BMI 22.73 kg/m   Assessment and Plan: 1. Other dysphagia Will try gabapentin again - he is given a taper up to 300 mg tid Follow up in 6 weeks - gabapentin (NEURONTIN) 100 MG capsule; Take 3 capsules (300 mg total) by mouth 3 (three) times daily.  Dispense: 270 capsule; Refill: 0  2. Hx of transient ischemic attack (TIA) He needs to resume aspirin - recommend M W F 81 mg  3. Sick sinus syndrome ([place]) I believe that he is performing carotid sinus massage and causing his already low heart rate to drop He has SSS and pacemaker was recommended but declined Due  to progressive sx, he needs to consult cardiology again - Ambulatory referral to Cardiology   Partially dictated using Dragon software. Any errors are unintentional.  Halina Maidens, MD [facility]  01/28/2020

## 2020-02-06 ENCOUNTER — Telehealth: Payer: Self-pay | Admitting: Internal Medicine

## 2020-02-06 NOTE — Telephone Encounter (Signed)
Copied from [place] [id number]. Topic: General - Other >> Feb 06, 2020  2:10 PM Antonieta Iba C wrote: Reason for CRM:  CB: [phone] pt is requesting a call back. Pt says that he has a few questions about his most recent visit. Pt says that he would like to speak with provider or providers  assistant further.

## 2020-02-07 NOTE — Telephone Encounter (Signed)
Called pt back left VM to call back. ° °KP °

## 2020-03-03 ENCOUNTER — Telehealth: Payer: Self-pay

## 2020-03-03 ENCOUNTER — Other Ambulatory Visit: Payer: Self-pay

## 2020-03-03 DIAGNOSIS — R1319 Other dysphagia: Secondary | ICD-10-CM

## 2020-03-03 DIAGNOSIS — Z1211 Encounter for screening for malignant neoplasm of colon: Secondary | ICD-10-CM

## 2020-03-03 MED ORDER — NA SULFATE-K SULFATE-MG SULF 17.5-3.13-1.6 GM/177ML PO SOLN: 1.0000 | Freq: Once | ORAL | 0 refills | Status: AC

## 2020-03-03 NOTE — Telephone Encounter (Signed)
Ginger,  Patient LVM on Thursday 03/02/20 for me to call him in regards to rescheduling his procedure with Dr. Allen Norris.  Reviewed chart- Dr. Allen Norris noted EGD with Colonoscopy.  I will call him today to get this rescheduled.  Thanks,  [place], [place]

## 2020-03-05 ENCOUNTER — Other Ambulatory Visit: Payer: Self-pay

## 2020-03-05 ENCOUNTER — Other Ambulatory Visit
Admission: RE | Admit: 2020-03-05 | Discharge: 2020-03-05 | Disposition: A | Discharge status: Home / Self Care | Payer: PPO | Source: Ambulatory Visit | Attending: Gastroenterology | Admitting: Gastroenterology

## 2020-03-05 DIAGNOSIS — Z20822 Contact with and (suspected) exposure to covid-19: Secondary | ICD-10-CM | POA: Insufficient documentation

## 2020-03-05 DIAGNOSIS — Z01812 Encounter for preprocedural laboratory examination: Secondary | ICD-10-CM | POA: Insufficient documentation

## 2020-03-05 LAB — SARS CORONAVIRUS 2 (TAT 6-24 HRS): SARS Coronavirus 2: NEGATIVE

## 2020-03-06 ENCOUNTER — Encounter: Payer: Self-pay | Admitting: Gastroenterology

## 2020-03-06 ENCOUNTER — Other Ambulatory Visit: Payer: PPO

## 2020-03-10 ENCOUNTER — Encounter: Payer: Self-pay | Admitting: Gastroenterology

## 2020-03-10 ENCOUNTER — Ambulatory Visit: Payer: PPO | Admitting: Anesthesiology

## 2020-03-10 ENCOUNTER — Encounter
Admission: RE | Disposition: A | Discharge status: Home / Self Care | Payer: Self-pay | Source: Home / Self Care | Attending: Gastroenterology

## 2020-03-10 ENCOUNTER — Ambulatory Visit
Admission: RE | Admit: 2020-03-10 | Discharge: 2020-03-10 | Disposition: A | Discharge status: Home / Self Care | Payer: PPO | Attending: Gastroenterology | Admitting: Gastroenterology

## 2020-03-10 ENCOUNTER — Other Ambulatory Visit: Payer: Self-pay

## 2020-03-10 DIAGNOSIS — F419 Anxiety disorder, unspecified: Secondary | ICD-10-CM | POA: Diagnosis not present

## 2020-03-10 DIAGNOSIS — D123 Benign neoplasm of transverse colon: Secondary | ICD-10-CM | POA: Diagnosis not present

## 2020-03-10 DIAGNOSIS — I739 Peripheral vascular disease, unspecified: Secondary | ICD-10-CM | POA: Insufficient documentation

## 2020-03-10 DIAGNOSIS — J449 Chronic obstructive pulmonary disease, unspecified: Secondary | ICD-10-CM | POA: Diagnosis not present

## 2020-03-10 DIAGNOSIS — R1319 Other dysphagia: Secondary | ICD-10-CM | POA: Diagnosis not present

## 2020-03-10 DIAGNOSIS — K641 Second degree hemorrhoids: Secondary | ICD-10-CM | POA: Diagnosis not present

## 2020-03-10 DIAGNOSIS — K259 Gastric ulcer, unspecified as acute or chronic, without hemorrhage or perforation: Secondary | ICD-10-CM | POA: Diagnosis not present

## 2020-03-10 DIAGNOSIS — K635 Polyp of colon: Secondary | ICD-10-CM

## 2020-03-10 DIAGNOSIS — Z1211 Encounter for screening for malignant neoplasm of colon: Secondary | ICD-10-CM

## 2020-03-10 DIAGNOSIS — K5790 Diverticulosis of intestine, part unspecified, without perforation or abscess without bleeding: Secondary | ICD-10-CM | POA: Diagnosis not present

## 2020-03-10 DIAGNOSIS — K5730 Diverticulosis of large intestine without perforation or abscess without bleeding: Secondary | ICD-10-CM | POA: Insufficient documentation

## 2020-03-10 DIAGNOSIS — K3189 Other diseases of stomach and duodenum: Secondary | ICD-10-CM

## 2020-03-10 DIAGNOSIS — N401 Enlarged prostate with lower urinary tract symptoms: Secondary | ICD-10-CM | POA: Diagnosis not present

## 2020-03-10 DIAGNOSIS — Z79899 Other long term (current) drug therapy: Secondary | ICD-10-CM | POA: Diagnosis not present

## 2020-03-10 DIAGNOSIS — K2950 Unspecified chronic gastritis without bleeding: Secondary | ICD-10-CM | POA: Diagnosis not present

## 2020-03-10 DIAGNOSIS — R1310 Dysphagia, unspecified: Secondary | ICD-10-CM | POA: Insufficient documentation

## 2020-03-10 DIAGNOSIS — M19049 Primary osteoarthritis, unspecified hand: Secondary | ICD-10-CM | POA: Insufficient documentation

## 2020-03-10 DIAGNOSIS — E785 Hyperlipidemia, unspecified: Secondary | ICD-10-CM | POA: Diagnosis not present

## 2020-03-10 DIAGNOSIS — N138 Other obstructive and reflux uropathy: Secondary | ICD-10-CM | POA: Diagnosis not present

## 2020-03-10 HISTORY — PX: ESOPHAGOGASTRODUODENOSCOPY (EGD) WITH PROPOFOL: SHX5813

## 2020-03-10 HISTORY — PX: COLONOSCOPY WITH PROPOFOL: SHX5780

## 2020-03-10 SURGERY — COLONOSCOPY WITH PROPOFOL
Anesthesia: General

## 2020-03-10 MED ORDER — SODIUM CHLORIDE 0.9 % IV SOLN: INTRAVENOUS | Status: DC

## 2020-03-10 MED ORDER — PROPOFOL 500 MG/50ML IV EMUL
INTRAVENOUS | Status: DC | PRN
  Administered 2020-03-10: 125 ug/kg/min via INTRAVENOUS

## 2020-03-10 MED ORDER — PROPOFOL 10 MG/ML IV BOLUS
INTRAVENOUS | Status: DC | PRN
  Administered 2020-03-10 (×3): 50 mg via INTRAVENOUS

## 2020-03-10 MED ORDER — PROPOFOL 500 MG/50ML IV EMUL
INTRAVENOUS | Status: AC
  Filled 2020-03-10: qty 50

## 2020-03-10 NOTE — H&P (Signed)
Timothy Lame, MD [facility] [address] [place], [place] [address] Phone:[phone] Fax : [phone]  Primary Care Physician:  Glean Hess, MD Primary Gastroenterologist:  Dr. Allen Norris  Pre-Procedure History & Physical: HPI:  Timothy Scott is a 74 y.o. male is here for an endoscopy and colonoscopy.   Past Medical History:  Diagnosis Date  . Benign neoplasm of colon   . Chronic airway obstruction, not elsewhere classified   . DDD (degenerative disc disease), cervical   . Degeneration of cervical intervertebral disc   . Dysphagia   . Dysuria   . Hyperlipidemia   . 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 Lattice degeneration of peripheral retina   . Leg varices 03/10/2015  . Osteoarthrosis, unspecified whether generalized or localized, hand   . Posttraumatic stress disorder   . Sinus bradycardia 04/17/2014  . Varicose veins of bilateral lower extremities with other complications   . Wears dentures    partial upper and lower    Past Surgical History:  Procedure Laterality Date  . CATARACT EXTRACTION Right 2015  . CATARACT EXTRACTION W/PHACO Left 10/21/2015   Procedure: CATARACT EXTRACTION PHACO AND INTRAOCULAR LENS PLACEMENT (IOC);  Surgeon: Leandrew Koyanagi, MD;  Location: [place];  Service: Ophthalmology;  Laterality: Left;  . CATARACT EXTRACTION, BILATERAL  2016  . COLONOSCOPY  2014   benign polyps  . CYSTOSCOPY    . ESOPHAGEAL MANOMETRY N/A 08/03/2016   Procedure: ESOPHAGEAL MANOMETRY (EM);  Surgeon: Mauri Pole, MD;  Location: [facility];  Service: Endoscopy;  Laterality: N/A;  . ESOPHAGOGASTRODUODENOSCOPY    . ESOPHAGOGASTRODUODENOSCOPY (EGD) WITH PROPOFOL N/A 02/17/2016   with esoph dilatation  . TONSILLECTOMY      Prior to Admission medications   Medication Sig Start Date End Date Taking? Authorizing Provider  Calcium Carbonate-Vit D-Min (CALCIUM 1200 PO) Take 1 tablet by mouth daily.    Yes [provider]  fluticasone (FLONASE) 50 MCG/ACT nasal spray Place 2 sprays into both nostrils daily as needed for allergies or rhinitis.    Yes [provider]  glucosamine-chondroitin 500-400 MG tablet Take 1 tablet by mouth daily.   Yes [provider]  loperamide (IMODIUM A-D) 2 MG capsule Take 2-4 mg by mouth as needed (loose stools).    Yes [provider]  naproxen (NAPROSYN) 500 MG tablet Take 500 mg by mouth 2 (two) times daily as needed for mild pain or moderate pain.    Yes [provider]  Probiotic Product (PROBIOTIC DAILY) CAPS Take 1 capsule by mouth daily.    Yes [provider]  Saw Palmetto 450 MG CAPS Take 450 mg by mouth daily.    Yes [provider]  tamsulosin (FLOMAX) 0.4 MG CAPS capsule Take 0.4 mg by mouth every evening.    Yes [provider]  traZODone (DESYREL) 50 MG tablet Take 50 mg by mouth at bedtime.   Yes [provider]  finasteride (PROSCAR) 5 MG tablet Take 5 mg by mouth daily. Patient not taking: Reported on 03/10/2020    [provider]  gabapentin (NEURONTIN) 100 MG capsule Take 3 capsules (300 mg total) by mouth 3 (three) times daily. Patient not taking: Reported on 03/10/2020 01/28/20   Glean Hess, MD  omeprazole (PRILOSEC) 40 MG capsule Take 40 mg by mouth daily.  Patient not taking: Reported on 03/10/2020 09/27/18   [provider]    Allergies as of 03/04/2020  . (No Known  Allergies)    Family History  Problem Relation Age of Onset  . Hypertension Mother   . Cancer Mother        breast  . Aneurysm Mother   . Healthy Sister   . Prostate cancer Neg Hx   . Kidney cancer Neg Hx   . Bladder Cancer Neg Hx     Social History   Socioeconomic History  . Marital status: Divorced    Spouse name: Not on file  . Number of children: 1  . Years of education: some college  . Highest education level: 12th grade  Occupational History  . Occupation: Retired   Tobacco Use  . Smoking status: Former Smoker    Packs/day: 0.50    Years: 20.00    Pack years: 10.00    Types: Cigarettes, Pipe, Cigars    Quit date: 04/05/1986    Years since quitting: 33.9  . Smokeless tobacco: Never Used  . Tobacco comment: smoking cessation materials not required  Vaping Use  . Vaping Use: Never used  Substance and Sexual Activity  . Alcohol use: Yes    Alcohol/week: 6.0 standard drinks    Types: 6 Standard drinks or equivalent per week    Comment: socially  . Drug use: No  . Sexual activity: Not Currently  Other Topics Concern  . Not on file  Social History Narrative  . Not on file   Social Determinants of Health   Financial Resource Strain:   . Difficulty of Paying Living Expenses:   Food Insecurity:   . Worried About Charity fundraiser in the Last Year:   . Arboriculturist in the Last Year:   Transportation Needs:   . Film/video editor (Medical):   Marland Kitchen Lack of Transportation (Non-Medical):   Physical Activity: Inactive  . Days of Exercise per Week: 0 days  . Minutes of Exercise per Session: 0 min  Stress:   . Feeling of Stress :   Social Connections:   . Frequency of Communication with Friends and Family:   . Frequency of Social Gatherings with Friends and Family:   . Attends Religious Services:   . Active Member of Clubs or Organizations:   . Attends Archivist Meetings:   Marland Kitchen Marital Status:   Intimate Partner Violence:   . Fear of Current or Ex-Partner:   . Emotionally Abused:   Marland Kitchen Physically Abused:   . Sexually Abused:     Review of Systems: See HPI, otherwise negative ROS  Physical Exam: BP 121/67   Pulse 66   Temp 97.6 F (36.4 C) (Temporal)   Resp 18   Ht 5\' 11"  (1.803 m)   Wt 72.6 kg   SpO2 98%   BMI 22.32 kg/m  General:   Alert,  pleasant and cooperative in NAD Head:  Normocephalic and atraumatic. Neck:  Supple; no masses or thyromegaly. Lungs:  Clear throughout to auscultation.    Heart:  Regular rate  and rhythm. Abdomen:  Soft, nontender and nondistended. Normal bowel sounds, without guarding, and without rebound.   Neurologic:  Alert and  oriented x4;  grossly normal neurologically.  Impression/Plan: Timothy Scott is here for an endoscopy and colonoscopy to be performed for screening and odynophagia  Risks, benefits, limitations, and alternatives regarding  endoscopy and colonoscopy have been reviewed with the patient.  Questions have been answered.  All parties agreeable.   Timothy Lame, MD  03/10/2020, 10:39 AM

## 2020-03-10 NOTE — Op Note (Signed)
[facility] Patient Name: Timothy Scott Procedure Date: 03/10/2020 10:35 AM MRN: [id number] Account #: [id number] Date of Birth: 1946/01/18 Admit Type: Outpatient Age: 74 Room: [facility] ENDO ROOM 4 Gender: Male Note Status: Finalized Procedure:             Colonoscopy Indications:           Screening for colorectal malignant neoplasm Providers:             [name] MD, MD Medicines:             Propofol per Anesthesia Complications:         No immediate complications. Procedure:             Pre-Anesthesia Assessment:                        - Prior to the procedure, a History and Physical was                         performed, and patient medications and allergies were                         reviewed. The patient's tolerance of previous                         anesthesia was also reviewed. The risks and benefits                         of the procedure and the sedation options and risks                         were discussed with the patient. All questions were                         answered, and informed consent was obtained. Prior                         Anticoagulants: The patient has taken no previous                         anticoagulant or antiplatelet agents. ASA Grade                         Assessment: II - A patient with mild systemic disease.                         After reviewing the risks and benefits, the patient                         was deemed in satisfactory condition to undergo the                         procedure.                        After obtaining informed consent, the colonoscope was                         passed under direct vision. Throughout the procedure,  the patient's blood pressure, pulse, and oxygen                         saturations were monitored continuously. The                         Colonoscope was introduced through the anus and                         advanced to the the  cecum, identified by appendiceal                         orifice and ileocecal valve. The colonoscopy was                         performed without difficulty. The patient tolerated                         the procedure well. The quality of the bowel                         preparation was excellent. Findings:      The perianal and digital rectal examinations were normal.      A 4 mm polyp was found in the transverse colon. The polyp was sessile.       The polyp was removed with a cold biopsy forceps. Resection and       retrieval were complete.      A few small-mouthed diverticula were found in the entire colon.      Non-bleeding internal hemorrhoids were found during retroflexion. The       hemorrhoids were Grade II (internal hemorrhoids that prolapse but reduce       spontaneously). Impression:            - One 4 mm polyp in the transverse colon, removed with                         a cold biopsy forceps. Resected and retrieved.                        - Diverticulosis in the entire examined colon.                        - Non-bleeding internal hemorrhoids. Recommendation:        - Discharge patient to home.                        - Resume previous diet.                        - Continue present medications. Procedure Code(s):     --- Professional ---                        [phone], Colonoscopy, flexible; with biopsy, single or                         multiple Diagnosis Code(s):     --- Professional ---  Z12.11, Encounter for screening for malignant neoplasm                         of colon                        K63.5, Polyp of colon CPT copyright 2019 American Medical Association. All rights reserved. The codes documented in this report are preliminary and upon coder review may  be revised to meet current compliance requirements. [name] MD, MD 03/10/2020 11:10:42 AM This report has been signed electronically. Number of Addenda: 0 Note Initiated On:  03/10/2020 10:35 AM Scope Withdrawal Time: 0 hours 8 minutes 45 seconds  Total Procedure Duration: 0 hours 12 minutes 32 seconds  Estimated Blood Loss:  Estimated blood loss: none.      [facility]

## 2020-03-10 NOTE — Anesthesia Preprocedure Evaluation (Signed)
Anesthesia Evaluation  Patient identified by MRN, date of birth, ID band Patient awake    Reviewed: Allergy & Precautions, H&P , NPO status , Patient's Chart, lab work & pertinent test results  History of Anesthesia Complications Negative for: history of anesthetic complications  Airway Mallampati: III  TM Distance: <3 FB Neck ROM: limited    Dental  (+) Poor Dentition, Chipped, Missing, Partial Upper, Partial Lower, Caps, Dental Advidsory Given   Pulmonary neg shortness of breath, COPD, neg recent URI, former smoker,    Pulmonary exam normal breath sounds clear to auscultation       Cardiovascular Exercise Tolerance: Good (-) hypertension(-) angina+ Peripheral Vascular Disease  (-) Past MI, (-) Cardiac Stents and (-) DOE Normal cardiovascular exam+ dysrhythmias  Rhythm:regular Rate:Normal     Neuro/Psych neg Seizures PSYCHIATRIC DISORDERS Anxiety  Neuromuscular disease    GI/Hepatic negative GI ROS, Neg liver ROS,   Endo/Other  negative endocrine ROS  Renal/GU negative Renal ROS  negative genitourinary   Musculoskeletal  (+) Arthritis ,   Abdominal   Peds  Hematology negative hematology ROS (+)   Anesthesia Other Findings Past Medical History:   Posttraumatic stress disorder                                Degeneration of cervical intervertebral disc                 Lattice degeneration of peripheral retina                    Osteoarthrosis, unspecified whether generalize*              Benign neoplasm of colon                                     Chronic airway obstruction, not elsewhere clas*              COPD (chronic obstructive pulmonary disease) (*              Leg varices                                     03/10/2015     Sinus bradycardia                               04/17/2014    Wears dentures                                                 Comment:partial upper and lower   DDD (degenerative disc  disease), cervical                    Dysuria                                                      Dysphagia  Varicose veins of bilateral lower extremities *              Intervertebral disc disorder of lumbar region *             Past Surgical History:   TONSILLECTOMY                                                 COLONOSCOPY                                                   CYSTOSCOPY                                                    CATARACT EXTRACTION                             Right 2015         COLONOSCOPY                                      2014           Comment:benign polyps   CATARACT EXTRACTION W/PHACO                     Left 10/21/2015      Comment:Procedure: CATARACT EXTRACTION PHACO AND               INTRAOCULAR LENS PLACEMENT ([place]);  Surgeon:               Leandrew Koyanagi, MD;  Location: [place];  Service: Ophthalmology;                Laterality: Left;     Reproductive/Obstetrics negative OB ROS                             Anesthesia Physical  Anesthesia Plan  ASA: III  Anesthesia Plan: General   Post-op Pain Management:    Induction: Intravenous  PONV Risk Score and Plan: Propofol infusion and TIVA  Airway Management Planned: Natural Airway and Nasal Cannula  Additional Equipment:   Intra-op Plan:   Post-operative Plan:   Informed Consent: I have reviewed the patients History and Physical, chart, labs and discussed the procedure including the risks, benefits and alternatives for the proposed anesthesia with the patient or authorized representative who has indicated his/her understanding and acceptance.     Dental Advisory Given  Plan Discussed with: Anesthesiologist, CRNA and Surgeon  Anesthesia Plan Comments:         Anesthesia Quick Evaluation

## 2020-03-10 NOTE — Op Note (Signed)
[facility] Gastroenterology Patient Name: Timothy Scott Procedure Date: 03/10/2020 10:36 AM MRN: [id number] Account #: [id number] Date of Birth: 1946/06/16 Admit Type: Outpatient Age: 74 Room: [facility] ENDO ROOM 4 Gender: Male Note Status: Finalized Procedure:             Upper GI endoscopy Indications:           Odynophagia Providers:             [name] MD, MD Medicines:             Propofol per Anesthesia Complications:         No immediate complications. Procedure:             Pre-Anesthesia Assessment:                        - Prior to the procedure, a History and Physical was                         performed, and patient medications and allergies were                         reviewed. The patient's tolerance of previous                         anesthesia was also reviewed. The risks and benefits                         of the procedure and the sedation options and risks                         were discussed with the patient. All questions were                         answered, and informed consent was obtained. Prior                         Anticoagulants: The patient has taken no previous                         anticoagulant or antiplatelet agents. ASA Grade                         Assessment: II - A patient with mild systemic disease.                         After reviewing the risks and benefits, the patient                         was deemed in satisfactory condition to undergo the                         procedure.                        After obtaining informed consent, the endoscope was                         passed under direct vision. Throughout the procedure,  the patient's blood pressure, pulse, and oxygen                         saturations were monitored continuously. The Endoscope                         was introduced through the mouth, and advanced to the                         second part of duodenum. The upper  GI endoscopy was                         accomplished without difficulty. The patient tolerated                         the procedure well. Findings:      The examined esophagus was normal.      A single localized 4 mm erosion with no bleeding and no stigmata of       recent bleeding was found in the gastric antrum. Biopsies were taken       with a cold forceps for histology.      The examined duodenum was normal. Impression:            - Normal esophagus.                        - Erosive gastropathy with no bleeding and no stigmata                         of recent bleeding. Biopsied.                        - Normal examined duodenum. Recommendation:        - Discharge patient to home.                        - Resume previous diet.                        - Continue present medications.                        - Await pathology results.                        - Perform a colonoscopy today. Procedure Code(s):     --- Professional ---                        [phone], Esophagogastroduodenoscopy, flexible,                         transoral; with biopsy, single or multiple Diagnosis Code(s):     --- Professional ---                        R13.10, Dysphagia, unspecified                        K31.89, Other diseases of stomach and duodenum CPT copyright 2019 American Medical Association. All rights reserved. The codes documented in this report are preliminary  and upon coder review may  be revised to meet current compliance requirements. [name] MD, MD 03/10/2020 10:54:31 AM This report has been signed electronically. Number of Addenda: 0 Note Initiated On: 03/10/2020 10:36 AM Estimated Blood Loss:  Estimated blood loss: none.      [facility]

## 2020-03-10 NOTE — Transfer of Care (Signed)
Immediate Anesthesia Transfer of Care Note  Patient: Timothy Scott  Procedure(s) Performed: COLONOSCOPY WITH PROPOFOL (N/A ) ESOPHAGOGASTRODUODENOSCOPY (EGD) WITH PROPOFOL (N/A )  Patient Location: PACU and Endoscopy Unit  Anesthesia Type:General  Level of Consciousness: awake, alert  and oriented  Airway & Oxygen Therapy: Patient Spontanous Breathing  Post-op Assessment: Report given to RN and Post -op Vital signs reviewed and stable  Post vital signs: Reviewed and stable  Last Vitals:  Vitals Value Taken Time  BP 95/61 03/10/20 1113  Temp 35.8 C 03/10/20 1113  Pulse 58 03/10/20 1114  Resp 14 03/10/20 1114  SpO2 96 % 03/10/20 1114  Vitals shown include unvalidated device data.  Last Pain:  Vitals:   03/10/20 1113  TempSrc: Tympanic  PainSc: Asleep         Complications: No complications documented.

## 2020-03-11 ENCOUNTER — Encounter: Payer: Self-pay | Admitting: Gastroenterology

## 2020-03-11 NOTE — Anesthesia Postprocedure Evaluation (Signed)
Anesthesia Post Note  Patient: MARIUSZ JUBB  Procedure(s) Performed: COLONOSCOPY WITH PROPOFOL (N/A ) ESOPHAGOGASTRODUODENOSCOPY (EGD) WITH PROPOFOL (N/A )  Patient location during evaluation: PACU Anesthesia Type: General Level of consciousness: awake and alert Pain management: pain level controlled Vital Signs Assessment: post-procedure vital signs reviewed and stable Respiratory status: spontaneous breathing, nonlabored ventilation and respiratory function stable Cardiovascular status: blood pressure returned to baseline and stable Postop Assessment: no apparent nausea or vomiting Anesthetic complications: no   No complications documented.   Last Vitals:  Vitals:   03/10/20 1123 03/10/20 1133  BP: (!) 95/59 109/68  Pulse: (!) 53 (!) 51  Resp: 13 16  Temp:    SpO2: 96% 98%    Last Pain:  Vitals:   03/10/20 1133  TempSrc:   PainSc: 0-No pain                 Brett Canales Darielys Giglia

## 2020-03-12 LAB — SURGICAL PATHOLOGY

## 2020-03-18 ENCOUNTER — Telehealth: Payer: Self-pay

## 2020-03-18 NOTE — Telephone Encounter (Signed)
-----   Message from [name], MD sent at 03/17/2020  7:56 PM EDT ----- Please have the patient come in for a follow up.

## 2020-03-18 NOTE — Telephone Encounter (Signed)
Pt notified of results and scheduled a follow up appt.

## 2020-04-01 ENCOUNTER — Encounter: Payer: Self-pay | Admitting: Internal Medicine

## 2020-04-01 ENCOUNTER — Ambulatory Visit (INDEPENDENT_AMBULATORY_CARE_PROVIDER_SITE_OTHER): Payer: PPO | Admitting: Internal Medicine

## 2020-04-01 ENCOUNTER — Other Ambulatory Visit: Payer: Self-pay

## 2020-04-01 ENCOUNTER — Other Ambulatory Visit
Admission: RE | Admit: 2020-04-01 | Discharge: 2020-04-01 | Disposition: A | Discharge status: Home / Self Care | Payer: PPO | Attending: Internal Medicine | Admitting: Internal Medicine

## 2020-04-01 VITALS — BP 118/60 | HR 57 | Temp 98.1°F | Ht 71.0 in | Wt 162.0 lb

## 2020-04-01 DIAGNOSIS — R519 Headache, unspecified: Secondary | ICD-10-CM

## 2020-04-01 DIAGNOSIS — R1319 Other dysphagia: Secondary | ICD-10-CM

## 2020-04-01 LAB — SEDIMENTATION RATE: Sed Rate: 2 mm/hr (ref 0–20)

## 2020-04-01 MED ORDER — BACLOFEN 5 MG PO TABS: 1.0000 | ORAL_TABLET | Freq: Three times a day (TID) | ORAL | 0 refills | Status: DC

## 2020-04-01 NOTE — Progress Notes (Signed)
Date:  04/01/2020   Name:  Timothy Scott   DOB:  May 13, 1946   MRN:  [id number]   Chief Complaint: Headache (X1-2 months, with dizziness, pressure around eyes, feels light headed, no cough, no fever, no runny nose, ongoing throat problems X2-3 years got worse , cant breathe and chokes out of the blue, )  Headache  This is a new problem. The current episode started more than 1 month ago. The problem occurs daily. The problem has been unchanged. The pain is located in the temporal region. The pain does not radiate. The quality of the pain is described as sharp (lasting a few minutes). The pain is mild. Pertinent negatives include no abdominal pain, dizziness, fever or weakness.   Globus sensation - ongoing persistent symptoms without dysphagia or choking.  He has had complete work up by multiple specialists without cause found. He had a brain MRI in 05/2019 and CT and MRI neck/back last year.  He has tried gabapentin without benefit.  Most recently I prescribed Baclofen but he did not take it regularly.  Lab Results  Component Value Date   CREATININE 0.68 06/01/2019   BUN 13 06/01/2019   NA 142 06/01/2019   K 3.9 06/01/2019   CL 107 06/01/2019   CO2 27 06/01/2019   Lab Results  Component Value Date   CHOL 152 06/01/2019   HDL 65 06/01/2019   LDLCALC 77 06/01/2019   TRIG 48 06/01/2019   CHOLHDL 2.3 06/01/2019   Lab Results  Component Value Date   TSH 1.710 05/09/2019   Lab Results  Component Value Date   HGBA1C 5.1 06/01/2019   Lab Results  Component Value Date   WBC 6.7 06/01/2019   HGB 14.1 06/01/2019   HCT 40.4 06/01/2019   MCV 95.3 06/01/2019   PLT 271 06/01/2019   Lab Results  Component Value Date   ALT 22 05/31/2019   AST 23 05/31/2019   ALKPHOS 43 05/31/2019   BILITOT 0.9 05/31/2019     Review of Systems  Constitutional: Negative for chills, fatigue and fever.  HENT: Negative for trouble swallowing.   Respiratory: Positive for choking. Negative for  shortness of breath and wheezing.   Cardiovascular: Negative for chest pain and palpitations.  Gastrointestinal: Negative for abdominal pain, constipation and diarrhea.  Musculoskeletal: Negative for arthralgias.  Neurological: Positive for light-headedness and headaches. Negative for dizziness, syncope, speech difficulty and weakness.  Psychiatric/Behavioral: Positive for sleep disturbance. The patient is nervous/anxious.     Patient Active Problem List   Diagnosis Date Noted   Other diseases of stomach and duodenum    Encounter for screening colonoscopy    Polyp of transverse colon    Hx of transient ischemic attack (TIA) 01/28/2020   Elevated left ventricular end-diastolic pressure (LVEDP) 06/01/2019   Moderate aortic regurgitation 06/01/2019   Mood disturbance 02/21/2019   Weight loss 02/21/2019   Carotid artery plaque, left 07/09/2018   Sick sinus syndrome ([place]) 03/09/2017   Vertigo 12/12/2016   Anosmia 05/05/2016   Dysphagia 02/03/2016   Tobacco use disorder, moderate, in sustained remission 03/11/2015   Lumbar disc herniation with radiculopathy 03/11/2015   Gonalgia 03/10/2015   DDD (degenerative disc disease), cervical 03/10/2015   BPH with obstruction/lower urinary tract symptoms 03/10/2015   Degenerative arthritis of finger 03/10/2015   Post-traumatic stress disorder 03/10/2015   Lattice degeneration 03/10/2015   Leg varices 03/10/2015   Sinus bradycardia 04/17/2014    No Known Allergies  Past Surgical History:  Procedure Laterality Date   CATARACT EXTRACTION Right 2015   CATARACT EXTRACTION W/PHACO Left 10/21/2015   Procedure: CATARACT EXTRACTION PHACO AND INTRAOCULAR LENS PLACEMENT ([place]);  Surgeon: Leandrew Koyanagi, MD;  Location: [facility];  Service: Ophthalmology;  Laterality: Left;   CATARACT EXTRACTION, BILATERAL  2016   COLONOSCOPY  2014   benign polyps   COLONOSCOPY WITH PROPOFOL N/A 03/10/2020   Procedure:  COLONOSCOPY WITH PROPOFOL;  Surgeon: [name], MD;  Location: [facility] ENDOSCOPY;  Service: Endoscopy;  Laterality: N/A;  PRIORITY 4   CYSTOSCOPY     ESOPHAGEAL MANOMETRY N/A 08/03/2016   Procedure: ESOPHAGEAL MANOMETRY (EM);  Surgeon: Mauri Pole, MD;  Location: [facility] ENDOSCOPY;  Service: Endoscopy;  Laterality: N/A;   ESOPHAGOGASTRODUODENOSCOPY     ESOPHAGOGASTRODUODENOSCOPY (EGD) WITH PROPOFOL N/A 02/17/2016   with esoph dilatation   ESOPHAGOGASTRODUODENOSCOPY (EGD) WITH PROPOFOL N/A 03/10/2020   Procedure: ESOPHAGOGASTRODUODENOSCOPY (EGD) WITH PROPOFOL;  Surgeon: [name], MD;  Location: [facility] ENDOSCOPY;  Service: Endoscopy;  Laterality: N/A;   TONSILLECTOMY      Social History   Tobacco Use   Smoking status: Former Smoker    Packs/day: 0.50    Years: 20.00    Pack years: 10.00    Types: Cigarettes, Pipe, Cigars    Quit date: 04/05/1986    Years since quitting: 34.0   Smokeless tobacco: Never Used   Tobacco comment: smoking cessation materials not required  Vaping Use   Vaping Use: Never used  Substance Use Topics   Alcohol use: Yes    Alcohol/week: 6.0 standard drinks    Types: 6 Standard drinks or equivalent per week    Comment: socially   Drug use: No     Medication list has been reviewed and updated.  Current Meds  Medication Sig   Calcium Carbonate-Vit D-Min (CALCIUM 1200 PO) Take 1 tablet by mouth daily.    finasteride (PROSCAR) 5 MG tablet Take 5 mg by mouth daily.    fluticasone (FLONASE) 50 MCG/ACT nasal spray Place 2 sprays into both nostrils daily as needed for allergies or rhinitis.    glucosamine-chondroitin 500-400 MG tablet Take 1 tablet by mouth daily.   loperamide (IMODIUM A-D) 2 MG capsule Take 2-4 mg by mouth as needed (loose stools).    naproxen (NAPROSYN) 500 MG tablet Take 500 mg by mouth 2 (two) times daily as needed for mild pain or moderate pain.    omeprazole (PRILOSEC) 40 MG capsule Take 40 mg by mouth daily.    Saw  Palmetto 450 MG CAPS Take 450 mg by mouth daily.    tamsulosin (FLOMAX) 0.4 MG CAPS capsule Take 0.4 mg by mouth every evening.    traZODone (DESYREL) 50 MG tablet Take 50 mg by mouth at bedtime.    PHQ 2/9 Scores 04/01/2020 01/28/2020 05/09/2019 04/15/2019  PHQ - 2 Score 2 0 4 2  PHQ- 9 Score 11 0 11 10    GAD 7 : Generalized Anxiety Score 04/01/2020 01/28/2020  Nervous, Anxious, on Edge 1 2  Control/stop worrying 2 2  Worry too much - different things 2 2  Trouble relaxing 2 2  Restless 1 3  Easily annoyed or irritable 2 3  Afraid - awful might happen 1 3  Total GAD 7 Score 11 17  Anxiety Difficulty Not difficult at all Somewhat difficult    BP Readings from Last 3 Encounters:  04/01/20 (!) 118/60  03/10/20 109/68  01/28/20 (!) 124/58    Physical Exam Vitals and nursing note reviewed.  Constitutional:      General: He is not in acute distress.    Appearance: He is well-developed.  HENT:     Head: Normocephalic and atraumatic.  Cardiovascular:     Rate and Rhythm: Normal rate and regular rhythm.     Heart sounds: Normal heart sounds.  Pulmonary:     Effort: Pulmonary effort is normal. No respiratory distress.     Breath sounds: No wheezing or rhonchi.  Musculoskeletal:        General: Normal range of motion.     Cervical back: Normal range of motion. No erythema or rigidity. Muscular tenderness (tender under jaw line) present. No pain with movement.  Lymphadenopathy:     Cervical: No cervical adenopathy.  Skin:    General: Skin is warm and dry.     Findings: No rash.  Neurological:     Mental Status: He is alert and oriented to person, place, and time.     Comments: No TA tenderness No TMJ click, pain to palpation or abnormal excursioin  Psychiatric:        Mood and Affect: Mood is anxious.        Behavior: Behavior normal.        Thought Content: Thought content normal.     Wt Readings from Last 3 Encounters:  04/01/20 162 lb (73.5 kg)  03/10/20 160 lb (72.6  kg)  01/28/20 163 lb (73.9 kg)    BP (!) 118/60    Pulse 57    Temp 98.1 F (36.7 C) (Oral)    Ht 5' 11"  (1.803 m)    Wt 162 lb (73.5 kg)    SpO2 96%    BMI 22.59 kg/m   Assessment and Plan: 1. Temporal headache Obtain ESR to rule out TA No evidence of TMJ Will refer to Neurology per patient request - Ambulatory referral to Neurology - Sedimentation rate  2. Other dysphagia Discussed the work up to date and reassurance with age appropriate MRI brain and CT/MRI neck Encourage him to take the Baclofen regularly to be able to assess benefit - Ambulatory referral to Neurology - Baclofen 5 MG TABS; Take 1 tablet by mouth 3 (three) times daily.  Dispense: 90 tablet; Refill: 0   Partially dictated using Editor, commissioning. Any errors are unintentional.  Halina Maidens, MD [facility]  04/01/2020

## 2020-04-13 DIAGNOSIS — R519 Headache, unspecified: Secondary | ICD-10-CM | POA: Diagnosis not present

## 2020-04-13 DIAGNOSIS — R42 Dizziness and giddiness: Secondary | ICD-10-CM | POA: Diagnosis not present

## 2020-04-13 DIAGNOSIS — E519 Thiamine deficiency, unspecified: Secondary | ICD-10-CM | POA: Diagnosis not present

## 2020-04-13 DIAGNOSIS — E559 Vitamin D deficiency, unspecified: Secondary | ICD-10-CM | POA: Diagnosis not present

## 2020-04-13 DIAGNOSIS — E038 Other specified hypothyroidism: Secondary | ICD-10-CM | POA: Diagnosis not present

## 2020-04-13 DIAGNOSIS — E611 Iron deficiency: Secondary | ICD-10-CM | POA: Diagnosis not present

## 2020-04-13 DIAGNOSIS — R0989 Other specified symptoms and signs involving the circulatory and respiratory systems: Secondary | ICD-10-CM | POA: Diagnosis not present

## 2020-04-13 DIAGNOSIS — E538 Deficiency of other specified B group vitamins: Secondary | ICD-10-CM | POA: Diagnosis not present

## 2020-04-13 LAB — VITAMIN D 25 HYDROXY (VIT D DEFICIENCY, FRACTURES): Vit D, 25-Hydroxy: 32

## 2020-04-13 LAB — IRON,TIBC AND FERRITIN PANEL: Ferritin: 108

## 2020-04-13 LAB — VITAMIN B12: Vitamin B-12: 192

## 2020-04-13 LAB — TSH: TSH: 1.5 (ref 0.41–5.90)

## 2020-04-14 ENCOUNTER — Ambulatory Visit: Payer: PPO | Admitting: Gastroenterology

## 2020-04-14 ENCOUNTER — Other Ambulatory Visit: Payer: Self-pay | Admitting: Acute Care

## 2020-04-14 ENCOUNTER — Other Ambulatory Visit (HOSPITAL_COMMUNITY): Payer: Self-pay | Admitting: Acute Care

## 2020-04-14 ENCOUNTER — Other Ambulatory Visit: Payer: Self-pay

## 2020-04-14 ENCOUNTER — Encounter: Payer: Self-pay | Admitting: Gastroenterology

## 2020-04-14 VITALS — BP 111/53 | HR 61 | Ht 71.0 in | Wt 164.8 lb

## 2020-04-14 DIAGNOSIS — I639 Cerebral infarction, unspecified: Secondary | ICD-10-CM

## 2020-04-14 DIAGNOSIS — R0989 Other specified symptoms and signs involving the circulatory and respiratory systems: Secondary | ICD-10-CM | POA: Diagnosis not present

## 2020-04-14 NOTE — Progress Notes (Signed)
Primary Care Physician: Glean Hess, MD  Primary Gastroenterologist:  Dr. [name]  Chief Complaint  Patient presents with  . Follow up procedure results    HPI: Timothy Scott is a 74 y.o. male here for follow-up after having EGD.  The patient had an EGD and colonoscopy with a tubular adenoma found on the colonoscopy.  The patient had biopsies of the stomach due to erosion found.  The patient was found to have intestinal metaplasia on the biopsies.  Therefore the patient was asked to come in and review the pathology.  The patient continues to have a globus sensation despite taking a PPI and no findings in the esophagus consistent with any reflux.  Past Medical History:  Diagnosis Date  . Benign neoplasm of colon   . Chronic airway obstruction, not elsewhere classified   . DDD (degenerative disc disease), cervical   . Degeneration of cervical intervertebral disc   . Dysphagia   . Dysuria   . Hyperlipidemia   . 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 Lattice degeneration of peripheral retina   . Leg varices 03/10/2015  . Osteoarthrosis, unspecified whether generalized or localized, hand   . Posttraumatic stress disorder   . Sinus bradycardia 04/17/2014  . Varicose veins of bilateral lower extremities with other complications   . Wears dentures    partial upper and lower    Current Outpatient Medications  Medication Sig Dispense Refill  . Baclofen 5 MG TABS Take 1 tablet by mouth 3 (three) times daily. 90 tablet 0  . Calcium Carbonate-Vit D-Min (CALCIUM 1200 PO) Take 1 tablet by mouth daily.     . finasteride (PROSCAR) 5 MG tablet Take 5 mg by mouth daily.     . fluticasone (FLONASE) 50 MCG/ACT nasal spray Place 2 sprays into both nostrils daily as needed for allergies or rhinitis.     Marland Kitchen glucosamine-chondroitin 500-400 MG tablet Take 1 tablet by mouth daily.    Marland Kitchen loperamide (IMODIUM A-D) 2 MG capsule Take 2-4 mg by mouth as needed (loose  stools).     . naproxen (NAPROSYN) 500 MG tablet Take 500 mg by mouth 2 (two) times daily as needed for mild pain or moderate pain.     Marland Kitchen omeprazole (PRILOSEC) 40 MG capsule Take 40 mg by mouth daily.     . Saw Palmetto 450 MG CAPS Take 450 mg by mouth daily.     . tamsulosin (FLOMAX) 0.4 MG CAPS capsule Take 0.4 mg by mouth every evening.     . traZODone (DESYREL) 50 MG tablet Take 50 mg by mouth at bedtime.     No current facility-administered medications for this visit.    Allergies as of 04/14/2020  . (No Known Allergies)    ROS:  General: Negative for anorexia, weight loss, fever, chills, fatigue, weakness. ENT: Negative for hoarseness, difficulty swallowing , nasal congestion. CV: Negative for chest pain, angina, palpitations, dyspnea on exertion, peripheral edema.  Respiratory: Negative for dyspnea at rest, dyspnea on exertion, cough, sputum, wheezing.  GI: See history of present illness. GU:  Negative for dysuria, hematuria, urinary incontinence, urinary frequency, nocturnal urination.  Endo: Negative for unusual weight change.    Physical Examination:   BP (!) 111/53   Pulse 61   Ht 5\' 11"  (1.803 m)   Wt 164 lb 12.8 oz (74.8 kg)   BMI 22.98 kg/m   General: Well-nourished, well-developed in no acute distress.  Eyes: No icterus.  Conjunctivae pink. Neuro: Alert and oriented x 3.  Grossly intact. Skin: Warm and dry, no jaundice.   Psych: Alert and cooperative, normal mood and affect.  Labs:    Imaging Studies: No results found.  Assessment and Plan:   Timothy Scott is a 74 y.o. y/o male who comes in today with a history of a globus sensation with an EGD and colonoscopy done showing a polyp that was adenomatous.  Due to patient age I do not recommend any further colonoscopies at this time.  The patient had an EGD with intestinal metaplasia and will be set up for repeat EGD with gastric mapping.  There was no signs of any cause for his globus.  The patient has had  no improvement on a PPI and therefore it is unlikely that his symptoms are related to acid reflux.  He has been recommended to follow-up with ENT and has been told that there is not much more to be done from a GI point of view for his globus sensation but he should follow-up periodically with upper endoscopies every 2 years for his gastric intestinal metaplasia.  The patient has been explained the plan and agrees with it.     [name], MD. [facility]    Note: This dictation was prepared with Dragon dictation along with smaller phrase technology. Any transcriptional errors that result from this process are unintentional.

## 2020-04-15 ENCOUNTER — Ambulatory Visit: Payer: PPO

## 2020-04-23 DIAGNOSIS — Z03818 Encounter for observation for suspected exposure to other biological agents ruled out: Secondary | ICD-10-CM | POA: Diagnosis not present

## 2020-04-23 DIAGNOSIS — Z1152 Encounter for screening for COVID-19: Secondary | ICD-10-CM | POA: Diagnosis not present

## 2020-04-24 DIAGNOSIS — E538 Deficiency of other specified B group vitamins: Secondary | ICD-10-CM | POA: Diagnosis not present

## 2020-05-01 DIAGNOSIS — K219 Gastro-esophageal reflux disease without esophagitis: Secondary | ICD-10-CM | POA: Diagnosis not present

## 2020-05-02 ENCOUNTER — Other Ambulatory Visit: Payer: Self-pay

## 2020-05-02 ENCOUNTER — Ambulatory Visit
Admission: RE | Admit: 2020-05-02 | Discharge: 2020-05-02 | Disposition: A | Discharge status: Home / Self Care | Payer: PPO | Source: Ambulatory Visit | Attending: Acute Care | Admitting: Acute Care

## 2020-05-02 DIAGNOSIS — I6782 Cerebral ischemia: Secondary | ICD-10-CM | POA: Diagnosis not present

## 2020-05-02 DIAGNOSIS — I639 Cerebral infarction, unspecified: Secondary | ICD-10-CM | POA: Diagnosis not present

## 2020-05-07 DIAGNOSIS — E538 Deficiency of other specified B group vitamins: Secondary | ICD-10-CM | POA: Diagnosis not present

## 2020-05-14 ENCOUNTER — Ambulatory Visit (INDEPENDENT_AMBULATORY_CARE_PROVIDER_SITE_OTHER): Payer: PPO | Admitting: Internal Medicine

## 2020-05-14 ENCOUNTER — Encounter: Payer: Self-pay | Admitting: Internal Medicine

## 2020-05-14 ENCOUNTER — Other Ambulatory Visit
Admission: RE | Admit: 2020-05-14 | Discharge: 2020-05-14 | Disposition: A | Discharge status: Home / Self Care | Payer: PPO | Attending: Internal Medicine | Admitting: Internal Medicine

## 2020-05-14 ENCOUNTER — Other Ambulatory Visit: Payer: Self-pay

## 2020-05-14 VITALS — BP 100/68 | HR 57 | Temp 98.1°F | Ht 71.0 in | Wt 163.0 lb

## 2020-05-14 DIAGNOSIS — Z0000 Encounter for general adult medical examination without abnormal findings: Secondary | ICD-10-CM | POA: Diagnosis not present

## 2020-05-14 DIAGNOSIS — R1319 Other dysphagia: Secondary | ICD-10-CM | POA: Diagnosis not present

## 2020-05-14 DIAGNOSIS — I495 Sick sinus syndrome: Secondary | ICD-10-CM | POA: Insufficient documentation

## 2020-05-14 DIAGNOSIS — E538 Deficiency of other specified B group vitamins: Secondary | ICD-10-CM | POA: Insufficient documentation

## 2020-05-14 DIAGNOSIS — N401 Enlarged prostate with lower urinary tract symptoms: Secondary | ICD-10-CM

## 2020-05-14 DIAGNOSIS — M5116 Intervertebral disc disorders with radiculopathy, lumbar region: Secondary | ICD-10-CM

## 2020-05-14 DIAGNOSIS — N138 Other obstructive and reflux uropathy: Secondary | ICD-10-CM | POA: Diagnosis not present

## 2020-05-14 LAB — POCT URINALYSIS DIPSTICK
Bilirubin, UA: NEGATIVE
Blood, UA: NEGATIVE
Glucose, UA: NEGATIVE
Ketones, UA: NEGATIVE
Leukocytes, UA: NEGATIVE
Nitrite, UA: NEGATIVE
Protein, UA: NEGATIVE
Spec Grav, UA: <=1.005 — AB (ref 1.010–1.025)
Urobilinogen, UA: 0.2 E.U./dL
pH, UA: 6.5 (ref 5.0–8.0)

## 2020-05-14 LAB — PSA: Prostatic Specific Antigen: 0.24 ng/mL (ref 0.00–4.00)

## 2020-05-14 LAB — CBC WITH DIFFERENTIAL/PLATELET
Abs Immature Granulocytes: 0.03 10*3/uL (ref 0.00–0.07)
Basophils Absolute: 0 10*3/uL (ref 0.0–0.1)
Basophils Relative: 1 %
Eosinophils Absolute: 0.1 10*3/uL (ref 0.0–0.5)
Eosinophils Relative: 1 %
HCT: 41.9 % (ref 39.0–52.0)
Hemoglobin: 14.6 g/dL (ref 13.0–17.0)
Immature Granulocytes: 0 %
Lymphocytes Relative: 14 %
Lymphs Abs: 1.2 10*3/uL (ref 0.7–4.0)
MCH: 33.5 pg (ref 26.0–34.0)
MCHC: 34.8 g/dL (ref 30.0–36.0)
MCV: 96.1 fL (ref 80.0–100.0)
Monocytes Absolute: 0.8 10*3/uL (ref 0.1–1.0)
Monocytes Relative: 9 %
Neutro Abs: 6.7 10*3/uL (ref 1.7–7.7)
Neutrophils Relative %: 75 %
Platelets: 298 10*3/uL (ref 150–400)
RBC: 4.36 MIL/uL (ref 4.22–5.81)
RDW: 12.1 % (ref 11.5–15.5)
WBC: 8.8 10*3/uL (ref 4.0–10.5)
nRBC: 0 % (ref 0.0–0.2)

## 2020-05-14 LAB — COMPREHENSIVE METABOLIC PANEL
ALT: 19 U/L (ref 0–44)
AST: 20 U/L (ref 15–41)
Albumin: 4.4 g/dL (ref 3.5–5.0)
Alkaline Phosphatase: 42 U/L (ref 38–126)
Anion gap: 8 (ref 5–15)
BUN: 15 mg/dL (ref 8–23)
CO2: 26 mmol/L (ref 22–32)
Calcium: 8.8 mg/dL — ABNORMAL LOW (ref 8.9–10.3)
Chloride: 103 mmol/L (ref 98–111)
Creatinine, Ser: 0.83 mg/dL (ref 0.61–1.24)
GFR calc Af Amer: >60 mL/min (ref >60)
GFR calc non Af Amer: >60 mL/min (ref >60)
Glucose, Bld: 105 mg/dL — ABNORMAL HIGH (ref 70–99)
Potassium: 4.1 mmol/L (ref 3.5–5.1)
Sodium: 137 mmol/L (ref 135–145)
Total Bilirubin: 1 mg/dL (ref 0.3–1.2)
Total Protein: 7 g/dL (ref 6.5–8.1)

## 2020-05-14 LAB — LIPID PANEL
Cholesterol: 170 mg/dL (ref 0–200)
HDL: 63 mg/dL (ref >40)
LDL Cholesterol: 96 mg/dL (ref 0–99)
Total CHOL/HDL Ratio: 2.7 RATIO
Triglycerides: 53 mg/dL (ref <150)
VLDL: 11 mg/dL (ref 0–40)

## 2020-05-14 NOTE — Progress Notes (Signed)
Date:  05/14/2020   Name:  Timothy Scott   DOB:  [birth year]   MRN:  [id number]   Chief Complaint: Annual Exam (feels fairly well, no excercise, sleeps poorly,has back pain, headaches, light headed, dizzy, double vision, sleep disturbance,)  Timothy Scott is a 74 y.o. male who presents today for his Complete Annual Exam. He feels fairly well but has several ongoing issues. He reports exercising some. He reports he is sleeping poorly.   Colonoscopy: 03/2020  Immunization History  Administered Date(s) Administered  . Influenza, High Dose Seasonal PF 06/11/2019  . Influenza,inj,Quad PF,6+ Mos 07/21/2017, 06/18/2018  . Influenza-Unspecified 09/06/2015, 06/18/2018  . Moderna SARS-COVID-2 Vaccination 11/04/2019, 12/05/2019  . Pneumococcal Conjugate-13 05/28/2015  . Pneumococcal Polysaccharide-23 09/06/2010  . Tdap 09/06/2010  . Zoster 09/06/2010  . Zoster Recombinat (Shingrix) 08/07/2018, 10/12/2018    Dysphagia - recent colonoscopy normal.  Seen by GI with EGD - reactive gastropathy and metaplasia.  Had neurology appt for headaches.  MRI normal except for microvascular white matter changes.  Taking omeprazole daily.  B12 def - noted on labs from Neurology.  Now taking an oral supplement after 3 weekly injections.   Lab Results  Component Value Date   CREATININE 0.68 06/01/2019   BUN 13 06/01/2019   NA 142 06/01/2019   K 3.9 06/01/2019   CL 107 06/01/2019   CO2 27 06/01/2019   Lab Results  Component Value Date   CHOL 152 06/01/2019   HDL 65 06/01/2019   LDLCALC 77 06/01/2019   TRIG 48 06/01/2019   CHOLHDL 2.3 06/01/2019   Lab Results  Component Value Date   TSH 1.50 04/13/2020   Lab Results  Component Value Date   HGBA1C 5.1 06/01/2019   Lab Results  Component Value Date   WBC 6.7 06/01/2019   HGB 14.1 06/01/2019   HCT 40.4 06/01/2019   MCV 95.3 06/01/2019   PLT 271 06/01/2019   Lab Results  Component Value Date   ALT 22 05/31/2019   AST 23 05/31/2019    ALKPHOS 43 05/31/2019   BILITOT 0.9 05/31/2019   Last vitamin D Lab Results  Component Value Date   VD25OH 32 04/13/2020   Lab Results  Component Value Date   VITAMINB12 192 04/13/2020     Review of Systems  Constitutional: Negative for appetite change, chills, diaphoresis, fatigue and unexpected weight change.  HENT: Positive for trouble swallowing. Negative for hearing loss, tinnitus and voice change.   Eyes: Positive for visual disturbance (double vision).  Respiratory: Negative for choking, shortness of breath and wheezing.   Cardiovascular: Negative for chest pain, palpitations and leg swelling.  Gastrointestinal: Negative for abdominal pain, blood in stool, constipation and diarrhea.  Genitourinary: Negative for difficulty urinating, dysuria and frequency.  Musculoskeletal: Negative for arthralgias, back pain and myalgias.  Skin: Negative for color change and rash.  Neurological: Positive for dizziness and headaches. Negative for syncope.  Hematological: Negative for adenopathy.  Psychiatric/Behavioral: Positive for dysphoric mood and sleep disturbance. The patient is nervous/anxious.     Patient Active Problem List   Diagnosis Date Noted  . Dietary B12 deficiency 05/14/2020  . Headache disorder 04/13/2020  . Other diseases of stomach and duodenum   . Encounter for screening colonoscopy   . Polyp of transverse colon   . Hx of transient ischemic attack (TIA) 01/28/2020  . Elevated left ventricular end-diastolic pressure (LVEDP) 06/01/2019  . Moderate aortic regurgitation 06/01/2019  . Mood disturbance 02/21/2019  . Weight loss 02/21/2019  .  Carotid artery plaque, left 07/09/2018  . Sick sinus syndrome ([place]) 03/09/2017  . Anosmia 05/05/2016  . Dysphagia 02/03/2016  . Tobacco use disorder, moderate, in sustained remission 03/11/2015  . Lumbar disc herniation with radiculopathy 03/11/2015  . Gonalgia 03/10/2015  . DDD (degenerative disc disease), cervical  03/10/2015  . BPH with obstruction/lower urinary tract symptoms 03/10/2015  . Degenerative arthritis of finger 03/10/2015  . Post-traumatic stress disorder 03/10/2015  . Lattice degeneration 03/10/2015  . Leg varices 03/10/2015    No Known Allergies  Past Surgical History:  Procedure Laterality Date  . CATARACT EXTRACTION Right 2015  . CATARACT EXTRACTION W/PHACO Left 10/21/2015   Procedure: CATARACT EXTRACTION PHACO AND INTRAOCULAR LENS PLACEMENT (IOC);  Surgeon: Leandrew Koyanagi, MD;  Location: [facility];  Service: Ophthalmology;  Laterality: Left;  . CATARACT EXTRACTION, BILATERAL  2016  . COLONOSCOPY  2014   benign polyps  . COLONOSCOPY WITH PROPOFOL N/A 03/10/2020   Procedure: COLONOSCOPY WITH PROPOFOL;  Surgeon: [name], MD;  Location: [facility] ENDOSCOPY;  Service: Endoscopy;  Laterality: N/A;  PRIORITY 4  . CYSTOSCOPY    . ESOPHAGEAL MANOMETRY N/A 08/03/2016   Procedure: ESOPHAGEAL MANOMETRY (EM);  Surgeon: Mauri Pole, MD;  Location: [facility] ENDOSCOPY;  Service: Endoscopy;  Laterality: N/A;  . ESOPHAGOGASTRODUODENOSCOPY    . ESOPHAGOGASTRODUODENOSCOPY (EGD) WITH PROPOFOL N/A 02/17/2016   with esoph dilatation  . ESOPHAGOGASTRODUODENOSCOPY (EGD) WITH PROPOFOL N/A 03/10/2020   Procedure: ESOPHAGOGASTRODUODENOSCOPY (EGD) WITH PROPOFOL;  Surgeon: [name], MD;  Location: [facility] ENDOSCOPY;  Service: Endoscopy;  Laterality: N/A;  . TONSILLECTOMY      Social History   Tobacco Use  . Smoking status: Former Smoker    Packs/day: 0.50    Years: 20.00    Pack years: 10.00    Types: Cigarettes, Pipe, Cigars    Quit date: 04/05/1986    Years since quitting: 34.1  . Smokeless tobacco: Never Used  . Tobacco comment: smoking cessation materials not required  Vaping Use  . Vaping Use: Never used  Substance Use Topics  . Alcohol use: Yes    Alcohol/week: 6.0 standard drinks    Types: 6 Standard drinks or equivalent per week    Comment: socially  . Drug use: No      Medication list has been reviewed and updated.  Current Meds  Medication Sig  . Baclofen 5 MG TABS Take 1 tablet by mouth 3 (three) times daily.  . Calcium Carbonate-Vit D-Min (CALCIUM 1200 PO) Take 1 tablet by mouth daily.   . finasteride (PROSCAR) 5 MG tablet Take 5 mg by mouth as needed.   . fluticasone (FLONASE) 50 MCG/ACT nasal spray Place 2 sprays into both nostrils daily as needed for allergies or rhinitis.   Marland Kitchen glucosamine-chondroitin 500-400 MG tablet Take 1 tablet by mouth daily.  . Misc Natural Products (DAILY HERBS PROSTATE PO) Take 1 tablet by mouth daily.  . naproxen (NAPROSYN) 500 MG tablet Take 500 mg by mouth 2 (two) times daily as needed for mild pain or moderate pain.   Marland Kitchen omeprazole (PRILOSEC) 40 MG capsule Take 40 mg by mouth daily.   . tamsulosin (FLOMAX) 0.4 MG CAPS capsule Take 0.4 mg by mouth as needed.   . traZODone (DESYREL) 50 MG tablet Take 100 mg by mouth at bedtime.   . [DISCONTINUED] loperamide (IMODIUM A-D) 2 MG capsule Take 2-4 mg by mouth as needed (loose stools).     PHQ 2/9 Scores 04/01/2020 01/28/2020 05/09/2019 04/15/2019  PHQ - 2 Score 2 0  4 2  PHQ- 9 Score 11 0 11 10    GAD 7 : Generalized Anxiety Score 04/01/2020 01/28/2020  Nervous, Anxious, on Edge 1 2  Control/stop worrying 2 2  Worry too much - different things 2 2  Trouble relaxing 2 2  Restless 1 3  Easily annoyed or irritable 2 3  Afraid - awful might happen 1 3  Total GAD 7 Score 11 17  Anxiety Difficulty Not difficult at all Somewhat difficult    BP Readings from Last 3 Encounters:  05/14/20 100/68  04/14/20 (!) 111/53  04/01/20 (!) 118/60    Physical Exam Vitals and nursing note reviewed.  Constitutional:      Appearance: Normal appearance. He is well-developed.  HENT:     Head: Normocephalic.     Right Ear: Tympanic membrane, ear canal and external ear normal.     Left Ear: Tympanic membrane, ear canal and external ear normal.     Nose: Nose normal.     Mouth/Throat:      Pharynx: Uvula midline.  Eyes:     Conjunctiva/sclera: Conjunctivae normal.     Pupils: Pupils are equal, round, and reactive to light.  Neck:     Thyroid: No thyromegaly.     Vascular: No carotid bruit.  Cardiovascular:     Rate and Rhythm: Normal rate and regular rhythm.     Heart sounds: Normal heart sounds.  Pulmonary:     Effort: Pulmonary effort is normal.     Breath sounds: Normal breath sounds. No wheezing.  Chest:     Breasts:        Right: No mass.        Left: No mass.  Abdominal:     General: Bowel sounds are normal.     Palpations: Abdomen is soft.     Tenderness: There is no abdominal tenderness.  Musculoskeletal:        General: Normal range of motion.     Cervical back: Normal range of motion and neck supple.     Right lower leg: No edema.     Left lower leg: No edema.  Lymphadenopathy:     Cervical: No cervical adenopathy.  Skin:    General: Skin is warm and dry.     Capillary Refill: Capillary refill takes less than 2 seconds.  Neurological:     General: No focal deficit present.     Mental Status: He is alert and oriented to person, place, and time.     Deep Tendon Reflexes: Reflexes are normal and symmetric.  Psychiatric:        Attention and Perception: Attention normal.        Mood and Affect: Mood normal.        Behavior: Behavior normal.     Wt Readings from Last 3 Encounters:  05/14/20 163 lb (73.9 kg)  04/14/20 164 lb 12.8 oz (74.8 kg)  04/01/20 162 lb (73.5 kg)    BP 100/68   Pulse (!) 57   Temp 98.1 F (36.7 C) (Oral)   Ht 5\' 11"  (1.803 m)   Wt 163 lb (73.9 kg)   SpO2 97%   BMI 22.73 kg/m   Assessment and Plan: 1. Annual physical exam Normal exam  Continue healthy diet, begin more regular exercise - Comprehensive metabolic panel - Lipid panel - POCT urinalysis dipstick  2. Other dysphagia Multiple evaluations by multiple specialists with no improvement Consider [facility] for a second opinion  3. BPH with  obstruction/lower urinary tract symptoms Followed by [facility] at [facility] Sx are unchanged - currently off of medication (flomax and finasteride) - PSA  4. Sick sinus syndrome ([place]) Recommend follow up with Cardiology for annual check up and recommended testing - CBC with Differential/Platelet  5. Dietary B12 deficiency Now on B12 supplements  6. Lumbar disc herniation with radiculopathy Currently doing well. Some knee OA is likely not referred from the lower back   Partially dictated using Editor, commissioning. Any errors are unintentional.  Halina Maidens, MD [facility]  05/14/2020   Partially dictated using Dragon software. Any errors are unintentional.  Halina Maidens, MD [facility]  05/14/2020

## 2020-05-15 DIAGNOSIS — R519 Headache, unspecified: Secondary | ICD-10-CM | POA: Diagnosis not present

## 2020-05-15 DIAGNOSIS — R0989 Other specified symptoms and signs involving the circulatory and respiratory systems: Secondary | ICD-10-CM | POA: Diagnosis not present

## 2020-05-15 DIAGNOSIS — R42 Dizziness and giddiness: Secondary | ICD-10-CM | POA: Diagnosis not present

## 2020-06-02 ENCOUNTER — Ambulatory Visit: Payer: Self-pay | Admitting: Urology

## 2020-06-22 ENCOUNTER — Telehealth: Payer: Self-pay

## 2020-06-22 NOTE — Telephone Encounter (Signed)
Copied from [place] [id number]. Topic: Appointment Scheduling - Scheduling Inquiry for Clinic >> Jun 22, 2020  3:22 PM Erick Blinks wrote: Pt wants to speak to PCP, declined next available appt. He has throat pain, choking/squeezing pain. Has been experiencing this for several years, grown increasingly worse.  Reason for CRM: [phone]

## 2020-06-22 NOTE — Telephone Encounter (Signed)
Sent my chart message to patient with Dr Gaspar Cola response summarized.   CM

## 2020-06-22 NOTE — Telephone Encounter (Signed)
I am sorry to hear that he is still having issues.  However, we have explored every option and all the findings are very reassuring that there is no life threatening problem.  My only other thought would be a physical symptom of anxiety and PTSD?  I believe that he has a psychiatrist and perhaps he could discuss this possibility with them?

## 2020-06-26 ENCOUNTER — Telehealth: Payer: Self-pay | Admitting: Internal Medicine

## 2020-06-26 NOTE — Telephone Encounter (Signed)
Copied from [place] [id number]. Topic: Medicare AWV >> Jun 26, 2020 10:06 AM Cher Nakai R wrote: Reason for CRM:  Left message for patient to call back and schedule the Medicare Annual Wellness Visit (AWV) in office or virtual  Last AWV 04/15/2019  Please schedule at anytime with [place].  40 minute appointment

## 2020-07-06 DIAGNOSIS — R569 Unspecified convulsions: Secondary | ICD-10-CM | POA: Insufficient documentation

## 2020-07-06 DIAGNOSIS — R519 Headache, unspecified: Secondary | ICD-10-CM | POA: Diagnosis not present

## 2020-07-06 DIAGNOSIS — R42 Dizziness and giddiness: Secondary | ICD-10-CM | POA: Diagnosis not present

## 2020-07-06 DIAGNOSIS — R198 Other specified symptoms and signs involving the digestive system and abdomen: Secondary | ICD-10-CM | POA: Diagnosis not present

## 2020-07-06 DIAGNOSIS — R55 Syncope and collapse: Secondary | ICD-10-CM | POA: Diagnosis not present

## 2020-07-18 DIAGNOSIS — R569 Unspecified convulsions: Secondary | ICD-10-CM | POA: Diagnosis not present

## 2020-08-03 ENCOUNTER — Telehealth: Payer: Self-pay

## 2020-08-03 NOTE — Telephone Encounter (Unsigned)
Copied from [place] [id number]. Topic: General - Inquiry >> Jul 31, 2020 11:49 AM [name] D wrote: Reason for CRM: Pt called saying baclofen 1/2 pill tid.  He was thinking if Dr. Army Melia wants to change the dosage.  He says its working but he was thinking Dr. Army Melia wanted to change in the dosage  CB# [phone]

## 2020-08-03 NOTE — Telephone Encounter (Signed)
Called pt left VM. Pts name was stated on VM. Told pt that he can take baclofen 1/2 tablet tid if it is working pt can continue to take it that was. Pt can take up to 1 tablet X3 times a day if he needs it.    KP

## 2020-08-04 DIAGNOSIS — R569 Unspecified convulsions: Secondary | ICD-10-CM | POA: Diagnosis not present

## 2020-08-18 DIAGNOSIS — R42 Dizziness and giddiness: Secondary | ICD-10-CM | POA: Diagnosis not present

## 2020-08-18 DIAGNOSIS — R519 Headache, unspecified: Secondary | ICD-10-CM | POA: Diagnosis not present

## 2020-08-18 DIAGNOSIS — R198 Other specified symptoms and signs involving the digestive system and abdomen: Secondary | ICD-10-CM | POA: Diagnosis not present

## 2020-08-18 DIAGNOSIS — R55 Syncope and collapse: Secondary | ICD-10-CM | POA: Diagnosis not present

## 2020-10-02 DIAGNOSIS — Z20822 Contact with and (suspected) exposure to covid-19: Secondary | ICD-10-CM | POA: Diagnosis not present

## 2020-10-02 DIAGNOSIS — Z03818 Encounter for observation for suspected exposure to other biological agents ruled out: Secondary | ICD-10-CM | POA: Diagnosis not present

## 2020-10-13 DIAGNOSIS — R569 Unspecified convulsions: Secondary | ICD-10-CM | POA: Diagnosis not present

## 2020-10-13 DIAGNOSIS — R198 Other specified symptoms and signs involving the digestive system and abdomen: Secondary | ICD-10-CM | POA: Diagnosis not present

## 2020-10-13 DIAGNOSIS — H538 Other visual disturbances: Secondary | ICD-10-CM | POA: Insufficient documentation

## 2020-10-13 DIAGNOSIS — R55 Syncope and collapse: Secondary | ICD-10-CM | POA: Diagnosis not present

## 2020-10-13 DIAGNOSIS — R42 Dizziness and giddiness: Secondary | ICD-10-CM | POA: Diagnosis not present

## 2020-10-22 ENCOUNTER — Other Ambulatory Visit: Payer: Self-pay

## 2020-10-22 ENCOUNTER — Ambulatory Visit: Payer: PPO | Admitting: Gastroenterology

## 2020-10-22 ENCOUNTER — Encounter: Payer: Self-pay | Admitting: Gastroenterology

## 2020-10-22 VITALS — BP 120/61 | HR 54 | Ht 71.0 in | Wt 166.8 lb

## 2020-10-22 DIAGNOSIS — R0989 Other specified symptoms and signs involving the circulatory and respiratory systems: Secondary | ICD-10-CM

## 2020-10-22 DIAGNOSIS — R198 Other specified symptoms and signs involving the digestive system and abdomen: Secondary | ICD-10-CM

## 2020-10-22 DIAGNOSIS — K31A0 Gastric intestinal metaplasia, unspecified: Secondary | ICD-10-CM

## 2020-10-22 DIAGNOSIS — R09A2 Foreign body sensation, throat: Secondary | ICD-10-CM

## 2020-10-22 NOTE — Progress Notes (Signed)
Primary Care Physician: Glean Hess, MD  Primary Gastroenterologist:  Dr. [name]  Chief Complaint  Patient presents with  . Follow-up    Globus sensation    HPI: Timothy Scott is a 75 y.o. male here for follow-up of a globus sensation.  The patient has been worked up by ENT and had an upper endoscopy and colonoscopy by me.  The patient was seen by neurology with the following assessment:  GLOBUS SENSATION - Worse.  - Patient with muscle tightness in the throat for the past 4 years.  - History of PTSD, depression, and anxiety, receives bupropion from [place].  - Has seen ENT and GI, workup unremarkable.  - Increase clonazepam to 0.5 (whole tab) twice daily. Refilled.  - Could consider increasing clonazepam to 3 times daily. Could also consider Lexapro for anxiety in the future.   The symptoms have been going on for some time and the patient had a speech pathology work-up back in 2018 with no source for the symptoms seen.  The patient has been told that he should come back for gastric mapping for gastrointestinal metaplasia at his last visit in August.  The patient now comes to talk about all of his issues and his follow-up.  Past Medical History:  Diagnosis Date  . Benign neoplasm of colon   . Chronic airway obstruction, not elsewhere classified   . DDD (degenerative disc disease), cervical   . Degeneration of cervical intervertebral disc   . Dysphagia   . Dysuria   . Hyperlipidemia   . 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 Lattice degeneration of peripheral retina   . Leg varices 03/10/2015  . Osteoarthrosis, unspecified whether generalized or localized, hand   . Polyp of transverse colon   . Posttraumatic stress disorder   . Sinus bradycardia 04/17/2014  . Varicose veins of bilateral lower extremities with other complications   . Wears dentures    partial upper and lower    Current Outpatient Medications  Medication Sig Dispense  Refill  . Calcium Carbonate-Vit D-Min (CALCIUM 1200 PO) Take 1 tablet by mouth daily.     . Carbidopa-Levodopa ER (SINEMET CR) 25-100 MG tablet controlled release Take 1 tablet by mouth daily.    . clonazePAM (KLONOPIN) 0.5 MG tablet Take 0.25 mg by mouth 2 (two) times daily.    . finasteride (PROSCAR) 5 MG tablet Take 5 mg by mouth as needed.     . fluticasone (FLONASE) 50 MCG/ACT nasal spray Place 2 sprays into both nostrils daily as needed for allergies or rhinitis.     Marland Kitchen glucosamine-chondroitin 500-400 MG tablet Take 1 tablet by mouth daily.    . Misc Natural Products (DAILY HERBS PROSTATE PO) Take 1 tablet by mouth daily.    . naproxen (NAPROSYN) 500 MG tablet Take 500 mg by mouth 2 (two) times daily as needed for mild pain or moderate pain.     Marland Kitchen omeprazole (PRILOSEC) 40 MG capsule Take 40 mg by mouth daily.     . tamsulosin (FLOMAX) 0.4 MG CAPS capsule Take 0.4 mg by mouth as needed.     . traZODone (DESYREL) 50 MG tablet Take 100 mg by mouth at bedtime.      No current facility-administered medications for this visit.    Allergies as of 10/22/2020  . (No Known Allergies)    ROS:  General: Negative for anorexia, weight loss, fever, chills, fatigue, weakness. ENT: Negative for hoarseness, difficulty swallowing ,  nasal congestion. CV: Negative for chest pain, angina, palpitations, dyspnea on exertion, peripheral edema.  Respiratory: Negative for dyspnea at rest, dyspnea on exertion, cough, sputum, wheezing.  GI: See history of present illness. GU:  Negative for dysuria, hematuria, urinary incontinence, urinary frequency, nocturnal urination.  Endo: Negative for unusual weight change.    Physical Examination:   BP 120/61   Pulse (!) 54   Ht 5\' 11"  (1.803 m)   Wt 166 lb 12.8 oz (75.7 kg)   BMI 23.26 kg/m   General: Well-nourished, well-developed in no acute distress.  Eyes: No icterus. Conjunctivae pink. Lungs: Clear to auscultation bilaterally. Non-labored. Heart:  Regular rate and rhythm, no murmurs rubs or gallops.  Abdomen: Bowel sounds are normal, nontender, nondistended, no hepatosplenomegaly or masses, no abdominal bruits or hernia , no rebound or guarding.   Extremities: No lower extremity edema. No clubbing or deformities. Neuro: Alert and oriented x 3.  Grossly intact. Skin: Warm and dry, no jaundice.   Psych: Alert and cooperative, normal mood and affect.  Labs:    Imaging Studies: No results found.  Assessment and Plan:   Timothy Scott is a 75 y.o. y/o male who comes in with a history of gastric intestinal metaplasia and globus.  The patient is now being seen by neurology and being treated for his globus.  The patient has been told the pathophysiology of globus and how all his GI work-up has been negative.  The patient will follow up with the neurologist and will call me when he wants to follow-up with the gastric mapping.  The patient states he wants to hold off until the pandemic is a bit better for his concerns about Covid.  The patient has been explained the risks of gastric test metaplasia and the need for having the upper endoscopy and he states he will contact when he is ready.     [name], MD. [facility]    Note: This dictation was prepared with Dragon dictation along with smaller phrase technology. Any transcriptional errors that result from this process are unintentional.

## 2020-11-27 ENCOUNTER — Telehealth: Payer: Self-pay | Admitting: Gastroenterology

## 2020-11-27 NOTE — Telephone Encounter (Signed)
Patient was seen a couple months ago and still having problems with stomach pain (0-10 his 8), goes between constipation and diarreah. What can he do?

## 2020-11-27 NOTE — Telephone Encounter (Signed)
Will you please call him back and offer him an appt in [place] on Wednesday? We have a 10:00am opening. Thank you!

## 2020-12-02 ENCOUNTER — Ambulatory Visit (INDEPENDENT_AMBULATORY_CARE_PROVIDER_SITE_OTHER): Payer: PPO | Admitting: Gastroenterology

## 2020-12-02 ENCOUNTER — Other Ambulatory Visit: Payer: Self-pay

## 2020-12-02 ENCOUNTER — Ambulatory Visit (INDEPENDENT_AMBULATORY_CARE_PROVIDER_SITE_OTHER): Payer: PPO

## 2020-12-02 ENCOUNTER — Encounter: Payer: Self-pay | Admitting: Gastroenterology

## 2020-12-02 VITALS — BP 148/70 | HR 70 | Temp 97.3°F | Ht 71.0 in | Wt 168.0 lb

## 2020-12-02 VITALS — Ht 71.0 in | Wt 167.0 lb

## 2020-12-02 DIAGNOSIS — G8929 Other chronic pain: Secondary | ICD-10-CM | POA: Insufficient documentation

## 2020-12-02 DIAGNOSIS — J449 Chronic obstructive pulmonary disease, unspecified: Secondary | ICD-10-CM | POA: Insufficient documentation

## 2020-12-02 DIAGNOSIS — R69 Illness, unspecified: Secondary | ICD-10-CM | POA: Insufficient documentation

## 2020-12-02 DIAGNOSIS — H2510 Age-related nuclear cataract, unspecified eye: Secondary | ICD-10-CM | POA: Insufficient documentation

## 2020-12-02 DIAGNOSIS — H5210 Myopia, unspecified eye: Secondary | ICD-10-CM | POA: Insufficient documentation

## 2020-12-02 DIAGNOSIS — M25561 Pain in right knee: Secondary | ICD-10-CM | POA: Insufficient documentation

## 2020-12-02 DIAGNOSIS — F32A Depression, unspecified: Secondary | ICD-10-CM | POA: Insufficient documentation

## 2020-12-02 DIAGNOSIS — L570 Actinic keratosis: Secondary | ICD-10-CM | POA: Insufficient documentation

## 2020-12-02 DIAGNOSIS — Z0000 Encounter for general adult medical examination without abnormal findings: Secondary | ICD-10-CM | POA: Diagnosis not present

## 2020-12-02 DIAGNOSIS — M549 Dorsalgia, unspecified: Secondary | ICD-10-CM | POA: Insufficient documentation

## 2020-12-02 DIAGNOSIS — K759 Inflammatory liver disease, unspecified: Secondary | ICD-10-CM | POA: Insufficient documentation

## 2020-12-02 DIAGNOSIS — K219 Gastro-esophageal reflux disease without esophagitis: Secondary | ICD-10-CM | POA: Insufficient documentation

## 2020-12-02 DIAGNOSIS — H9190 Unspecified hearing loss, unspecified ear: Secondary | ICD-10-CM | POA: Insufficient documentation

## 2020-12-02 DIAGNOSIS — R001 Bradycardia, unspecified: Secondary | ICD-10-CM | POA: Insufficient documentation

## 2020-12-02 DIAGNOSIS — M169 Osteoarthritis of hip, unspecified: Secondary | ICD-10-CM | POA: Insufficient documentation

## 2020-12-02 DIAGNOSIS — F1010 Alcohol abuse, uncomplicated: Secondary | ICD-10-CM | POA: Insufficient documentation

## 2020-12-02 DIAGNOSIS — K582 Mixed irritable bowel syndrome: Secondary | ICD-10-CM | POA: Diagnosis not present

## 2020-12-02 DIAGNOSIS — G2581 Restless legs syndrome: Secondary | ICD-10-CM | POA: Insufficient documentation

## 2020-12-02 NOTE — Patient Instructions (Signed)
Timothy Scott , Thank you for taking time to come for your Medicare Wellness Visit. I appreciate your ongoing commitment to your health goals. Please review the following plan we discussed and let me know if I can assist you in the future.   Screening recommendations/referrals: Colonoscopy: Done 03/10/20 Repeat in 2026 Recommended yearly ophthalmology/optometry visit for glaucoma screening and checkup Recommended yearly dental visit for hygiene and checkup  Vaccinations: Influenza vaccine: Done 06/30/2020 Pneumococcal vaccine: Done 09/06/10 & 05/28/15 Tdap vaccine: Done 09/19/2011 - Repeat in 2023 Shingles vaccine: Done 08/07/2018 & 10/12/2018   Covid-19: Done 11/04/19, 12/05/19, & 07/14/20  Advanced directives: Please bring a copy of your health care power of attorney and living will to the office to be added to your chart at your convenience.  Conditions/risks identified: Continue working on getting more active and work on stressing less.  Next appointment: Follow up in one year for your annual wellness visit.   Preventive Care 7 Years and Older, Male  Preventive care refers to lifestyle choices and visits with your health care provider that can promote health and wellness. What does preventive care include?  A yearly physical exam. This is also called an annual well check.  Dental exams once or twice a year.  Routine eye exams. Ask your health care provider how often you should have your eyes checked.  Personal lifestyle choices, including:  Daily care of your teeth and gums.  Regular physical activity.  Eating a healthy diet.  Avoiding tobacco and drug use.  Limiting alcohol use.  Practicing safe sex.  Taking low doses of aspirin every day.  Taking vitamin and mineral supplements as recommended by your health care provider. What happens during an annual well check? The services and screenings done by your health care provider during your annual well check will depend on your  age, overall health, lifestyle risk factors, and family history of disease. Counseling  Your health care provider may ask you questions about your:  Alcohol use.  Tobacco use.  Drug use.  Emotional well-being.  Home and relationship well-being.  Sexual activity.  Eating habits.  History of falls.  Memory and ability to understand (cognition).  Work and work Statistician. Screening  You may have the following tests or measurements:  Height, weight, and BMI.  Blood pressure.  Lipid and cholesterol levels. These may be checked every 5 years, or more frequently if you are over 38 years old.  Skin check.  Lung cancer screening. You may have this screening every year starting at age 49 if you have a 30-pack-year history of smoking and currently smoke or have quit within the past 15 years.  Fecal occult blood test (FOBT) of the stool. You may have this test every year starting at age 33.  Flexible sigmoidoscopy or colonoscopy. You may have a sigmoidoscopy every 5 years or a colonoscopy every 10 years starting at age 76.  Prostate cancer screening. Recommendations will vary depending on your family history and other risks.  Hepatitis C blood test.  Hepatitis B blood test.  Sexually transmitted disease (STD) testing.  Diabetes screening. This is done by checking your blood sugar (glucose) after you have not eaten for a while (fasting). You may have this done every 1-3 years.  Abdominal aortic aneurysm (AAA) screening. You may need this if you are a current or former smoker.  Osteoporosis. You may be screened starting at age 66 if you are at high risk. Talk with your health care provider about your  test results, treatment options, and if necessary, the need for more tests. Vaccines  Your health care provider may recommend certain vaccines, such as:  Influenza vaccine. This is recommended every year.  Tetanus, diphtheria, and acellular pertussis (Tdap, Td) vaccine. You  may need a Td booster every 10 years.  Zoster vaccine. You may need this after age 38.  Pneumococcal 13-valent conjugate (PCV13) vaccine. One dose is recommended after age 61.  Pneumococcal polysaccharide (PPSV23) vaccine. One dose is recommended after age 78. Talk to your health care provider about which screenings and vaccines you need and how often you need them. This information is not intended to replace advice given to you by your health care provider. Make sure you discuss any questions you have with your health care provider. Document Released: 09/18/2015 Document Revised: 05/11/2016 Document Reviewed: 06/23/2015 Elsevier Interactive Patient Education  2017 [place] Prevention in the Home Falls can cause injuries. They can happen to people of all ages. There are many things you can do to make your home safe and to help prevent falls. What can I do on the outside of my home?  Regularly fix the edges of walkways and driveways and fix any cracks.  Remove anything that might make you trip as you walk through a door, such as a raised step or threshold.  Trim any bushes or trees on the path to your home.  Use bright outdoor lighting.  Clear any walking paths of anything that might make someone trip, such as rocks or tools.  Regularly check to see if handrails are loose or broken. Make sure that both sides of any steps have handrails.  Any raised decks and porches should have guardrails on the edges.  Have any leaves, snow, or ice cleared regularly.  Use sand or salt on walking paths during winter.  Clean up any spills in your garage right away. This includes oil or grease spills. What can I do in the bathroom?  Use night lights.  Install grab bars by the toilet and in the tub and shower. Do not use towel bars as grab bars.  Use non-skid mats or decals in the tub or shower.  If you need to sit down in the shower, use a plastic, non-slip stool.  Keep the floor  dry. Clean up any water that spills on the floor as soon as it happens.  Remove soap buildup in the tub or shower regularly.  Attach bath mats securely with double-sided non-slip rug tape.  Do not have throw rugs and other things on the floor that can make you trip. What can I do in the bedroom?  Use night lights.  Make sure that you have a light by your bed that is easy to reach.  Do not use any sheets or blankets that are too big for your bed. They should not hang down onto the floor.  Have a firm chair that has side arms. You can use this for support while you get dressed.  Do not have throw rugs and other things on the floor that can make you trip. What can I do in the kitchen?  Clean up any spills right away.  Avoid walking on wet floors.  Keep items that you use a lot in easy-to-reach places.  If you need to reach something above you, use a strong step stool that has a grab bar.  Keep electrical cords out of the way.  Do not use floor polish or wax that  makes floors slippery. If you must use wax, use non-skid floor wax.  Do not have throw rugs and other things on the floor that can make you trip. What can I do with my stairs?  Do not leave any items on the stairs.  Make sure that there are handrails on both sides of the stairs and use them. Fix handrails that are broken or loose. Make sure that handrails are as long as the stairways.  Check any carpeting to make sure that it is firmly attached to the stairs. Fix any carpet that is loose or worn.  Avoid having throw rugs at the top or bottom of the stairs. If you do have throw rugs, attach them to the floor with carpet tape.  Make sure that you have a light switch at the top of the stairs and the bottom of the stairs. If you do not have them, ask someone to add them for you. What else can I do to help prevent falls?  Wear shoes that:  Do not have high heels.  Have rubber bottoms.  Are comfortable and fit you  well.  Are closed at the toe. Do not wear sandals.  If you use a stepladder:  Make sure that it is fully opened. Do not climb a closed stepladder.  Make sure that both sides of the stepladder are locked into place.  Ask someone to hold it for you, if possible.  Clearly mark and make sure that you can see:  Any grab bars or handrails.  First and last steps.  Where the edge of each step is.  Use tools that help you move around (mobility aids) if they are needed. These include:  Canes.  Walkers.  Scooters.  Crutches.  Turn on the lights when you go into a dark area. Replace any light bulbs as soon as they burn out.  Set up your furniture so you have a clear path. Avoid moving your furniture around.  If any of your floors are uneven, fix them.  If there are any pets around you, be aware of where they are.  Review your medicines with your doctor. Some medicines can make you feel dizzy. This can increase your chance of falling. Ask your doctor what other things that you can do to help prevent falls. This information is not intended to replace advice given to you by your health care provider. Make sure you discuss any questions you have with your health care provider. Document Released: 06/18/2009 Document Revised: 01/28/2016 Document Reviewed: 09/26/2014 Elsevier Interactive Patient Education  2017 [employer].

## 2020-12-02 NOTE — Progress Notes (Signed)
Subjective:   Timothy Scott is a 75 y.o. male who presents for Medicare Annual/Subsequent preventive examination.  Virtual Visit via Telephone Note  I connected with  Timothy Scott on 12/02/20 at 10:40 AM EDT by telephone and verified that I am speaking with the correct person using two identifiers.  Location: Patient: Home Provider: [facility] Persons participating in the virtual visit: patient/Nurse Health Advisor   I discussed the limitations, risks, security and privacy concerns of performing an evaluation and management service by telephone and the availability of in person appointments. The patient expressed understanding and agreed to proceed.  Interactive audio and video telecommunications were attempted between this nurse and patient, however failed, due to patient having technical difficulties OR patient did not have access to video capability.  We continued and completed visit with audio only.  Some vital signs may be absent or patient reported.   Dmarcus Decicco E Casandra Dallaire, LPN   Review of Systems     Cardiac Risk Factors include: advanced age (>57men, >34 women);Other (see comment);dyslipidemia;male gender, Risk factor comments: alcohol use     Objective:    Today's Vitals   12/02/20 1043 12/02/20 1044  Weight: 167 lb (75.8 kg)   Height: 5\' 11"  (1.803 m)   PainSc:  5    Body mass index is 23.29 kg/m.  Advanced Directives 12/02/2020 03/10/2020 05/31/2019 05/31/2019 04/15/2019 03/21/2018 05/05/2016  Does Patient Have a Medical Advance Directive? Yes Yes - Yes Yes Yes No  Type of Advance Directive [place];Living will [place];Living will [place];Living will Living will [place];Living will [place];Living will -  Does patient want to make changes to medical advance directive? - - - No - Patient declined - - -  Copy of [place] in Chart? No - copy requested Yes -  validated most recent copy scanned in chart (See row information) No - copy requested - No - copy requested No - copy requested -    Current Medications (verified) Outpatient Encounter Medications as of 12/02/2020  Medication Sig  . Calcium Carbonate-Vit D-Min (CALCIUM 1200 PO) Take 1 tablet by mouth daily.   . clonazePAM (KLONOPIN) 0.5 MG tablet Take 0.25 mg by mouth 2 (two) times daily.  . finasteride (PROSCAR) 5 MG tablet Take 5 mg by mouth as needed.   . fluticasone (FLONASE) 50 MCG/ACT nasal spray Place 2 sprays into both nostrils daily as needed for allergies or rhinitis.   Marland Kitchen glucosamine-chondroitin 500-400 MG tablet Take 1 tablet by mouth daily.  . Misc Natural Products (DAILY HERBS PROSTATE PO) Take 1 tablet by mouth daily.  . naproxen (NAPROSYN) 500 MG tablet Take 500 mg by mouth 2 (two) times daily as needed for mild pain or moderate pain.   Marland Kitchen omeprazole (PRILOSEC) 40 MG capsule Take 40 mg by mouth daily.   . saw palmetto 500 MG capsule Take 500 mg by mouth daily.  . tamsulosin (FLOMAX) 0.4 MG CAPS capsule Take 0.4 mg by mouth as needed.   . traZODone (DESYREL) 50 MG tablet Take 100 mg by mouth at bedtime.   . Carbidopa-Levodopa ER (SINEMET CR) 25-100 MG tablet controlled release Take 1 tablet by mouth daily. (Patient not taking: Reported on 12/02/2020)   No facility-administered encounter medications on file as of 12/02/2020.    Allergies (verified) Prazosin   History: Past Medical History:  Diagnosis Date  . Benign neoplasm of colon   . Chronic airway  obstruction, not elsewhere classified   . DDD (degenerative disc disease), cervical   . Degeneration of cervical intervertebral disc   . Dysphagia   . Dysuria   . Hyperlipidemia   . 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 Lattice degeneration of peripheral retina   . Leg varices 03/10/2015  . Osteoarthrosis, unspecified whether generalized or localized, hand   . Polyp of transverse colon   .  Posttraumatic stress disorder   . Sinus bradycardia 04/17/2014  . Varicose veins of bilateral lower extremities with other complications   . Wears dentures    partial upper and lower   Past Surgical History:  Procedure Laterality Date  . CATARACT EXTRACTION Right 2015  . CATARACT EXTRACTION W/PHACO Left 10/21/2015   Procedure: CATARACT EXTRACTION PHACO AND INTRAOCULAR LENS PLACEMENT (IOC);  Surgeon: Leandrew Koyanagi, MD;  Location: [facility];  Service: Ophthalmology;  Laterality: Left;  . CATARACT EXTRACTION, BILATERAL  2016  . COLONOSCOPY  2014   benign polyps  . COLONOSCOPY WITH PROPOFOL N/A 03/10/2020   Procedure: COLONOSCOPY WITH PROPOFOL;  Surgeon: [name], MD;  Location[facility] ENDOSCOPY;  Service: Endoscopy;  Laterality: N/A;  PRIORITY 4  . CYSTOSCOPY    . ESOPHAGEAL MANOMETRY N/A 08/03/2016   Procedure: ESOPHAGEAL MANOMETRY (EM);  Surgeon: Mauri Pole, MD;  Location: [facility] ENDOSCOPY;  Service: Endoscopy;  Laterality: N/A;  . ESOPHAGOGASTRODUODENOSCOPY    . ESOPHAGOGASTRODUODENOSCOPY (EGD) WITH PROPOFOL N/A 02/17/2016   with esoph dilatation  . ESOPHAGOGASTRODUODENOSCOPY (EGD) WITH PROPOFOL N/A 03/10/2020   Procedure: ESOPHAGOGASTRODUODENOSCOPY (EGD) WITH PROPOFOL;  Surgeon: [name], MD;  Location[facility]COPY;  Service: Endoscopy;  Laterality: N/A;  . TONSILLECTOMY     Family History  Problem Relation Age of Onset  . Hypertension Mother   . Cancer Mother        breast  . Aneurysm Mother   . Healthy Sister   . Prostate cancer Neg Hx   . Kidney cancer Neg Hx   . Bladder Cancer Neg Hx    Social History   Socioeconomic History  . Marital status: Divorced    Spouse name: Not on file  . Number of children: 1  . Years of education: some college  . Highest education level: 12th grade  Occupational History  . Occupation: Retired  Tobacco Use  . Smoking status: Former Smoker    Packs/day: 0.50    Years: 20.00    Pack years: 10.00    Types:  Cigarettes, Pipe, Cigars    Quit date: 04/05/1986    Years since quitting: 34.6  . Smokeless tobacco: Never Used  . Tobacco comment: smoking cessation materials not required  Vaping Use  . Vaping Use: Never used  Substance and Sexual Activity  . Alcohol use: Yes    Alcohol/week: 6.0 standard drinks    Types: 6 Standard drinks or equivalent per week    Comment: socially  . Drug use: No  . Sexual activity: Not Currently  Other Topics Concern  . Not on file  Social History Narrative  . Not on file   Social Determinants of Health   Financial Resource Strain: Low Risk   . Difficulty of Paying Living Expenses: Not hard at all  Food Insecurity: No Food Insecurity  . Worried About Charity fundraiser in the Last Year: Never true  . Ran Out of Food in the Last Year: Never true  Transportation Needs: No Transportation Needs  . Lack of Transportation (Medical): No  .  Lack of Transportation (Non-Medical): No  Physical Activity: Sufficiently Active  . Days of Exercise per Week: 5 days  . Minutes of Exercise per Session: 30 min  Stress: Stress Concern Present  . Feeling of Stress : Very much  Social Connections: Moderately Isolated  . Frequency of Communication with Friends and Family: More than three times a week  . Frequency of Social Gatherings with Friends and Family: Three times a week  . Attends Religious Services: Never  . Active Member of Clubs or Organizations: Yes  . Attends Archivist Meetings: 1 to 4 times per year  . Marital Status: Never married    Tobacco Counseling Counseling given: Not Answered Comment: smoking cessation materials not required   Clinical Intake:  Pre-visit preparation completed: Yes  Pain : 0-10 Pain Score: 5  Pain Location: Throat (seeing Dr Allen Norris) Pain Descriptors / Indicators: Aching Pain Onset: More than a month ago Pain Frequency: Intermittent Effect of Pain on Daily Activities: Weslie Rasmus, LPN     BMI - recorded:  23.29 Nutritional Status: BMI of 19-24  Normal Nutritional Risks: Nausea/ vomitting/ diarrhea (diarrhea with stomach issues) Diabetes: No  How often do you need to have someone help you when you read instructions, pamphlets, or other written materials from your doctor or pharmacy?: 1 - Never  Diabetic? no  Interpreter Needed?: No  Information entered by :: Gwendolyn Nishi, LPN   Activities of Daily Living In your present state of health, do you have any difficulty performing the following activities: 12/02/2020  Hearing? N  Vision? N  Difficulty concentrating or making decisions? Y  Walking or climbing stairs? N  Dressing or bathing? N  Doing errands, shopping? N  Preparing Food and eating ? N  Using the Toilet? N  In the past six months, have you accidently leaked urine? N  Do you have problems with loss of bowel control? N  Managing your Medications? N  Managing your Finances? N  Housekeeping or managing your Housekeeping? N  Some recent data might be hidden    Patient Care Team: Glean Hess, MD as PCP - General (Internal Medicine) Wellington Hampshire, MD as Consulting Physician (Cardiology) Abbie Sons, MD (Urology) Jannet Mantis, MD (Dermatology) [name], MD as Consulting Physician (Gastroenterology)  Indicate any recent Medical Services you may have received from other than [facility] providers in the past year (date may be approximate).     Assessment:   This is a routine wellness examination for Timothy Scott.  Hearing/Vision screen No exam data present  Dietary issues and exercise activities discussed: Current Exercise Habits: Home exercise routine, Type of exercise: walking, Time (Minutes): 30, Frequency (Times/Week): 5, Weekly Exercise (Minutes/Week): 150, Intensity: Mild, Exercise limited by: None identified  Goals    . DIET - INCREASE WATER INTAKE     Recommend to drink at least 6-8 8oz glasses of water per day.    . Patient Stated     Would  like to start going back to gym and start playing golf again - to be in better health overall      Depression Screen PHQ 2/9 Scores 12/02/2020 04/01/2020 01/28/2020 05/09/2019 04/15/2019 04/01/2019 02/21/2019  PHQ - 2 Score 3 2 0 4 2 4 5   PHQ- 9 Score 10 11 0 11 10 11 9     Fall Risk Fall Risk  12/02/2020 04/01/2020 01/28/2020 05/09/2019 04/15/2019  Falls in the past year? 0 0 0 0 0  Number falls in past yr: 0 -  0 0 0  Injury with Fall? 0 - 0 0 0  Risk for fall due to : No Fall Risks No Fall Risks No Fall Risks - -  Risk for fall due to: Comment - - - - -  Follow up Falls prevention discussed Falls evaluation completed Falls evaluation completed - Falls prevention discussed    FALL RISK PREVENTION PERTAINING TO THE HOME:  Any stairs in or around the home? No  If so, are there any without handrails? No  Home free of loose throw rugs in walkways, pet beds, electrical cords, etc? Yes  Adequate lighting in your home to reduce risk of falls? Yes   ASSISTIVE DEVICES UTILIZED TO PREVENT FALLS:  Life alert? No  Use of a cane, walker or w/c? No  Grab bars in the bathroom? No  Shower chair or bench in shower? No  Elevated toilet seat or a handicapped toilet? No   TIMED UP AND GO:  Was the test performed? No .  Telephonic visit  Cognitive Function:     6CIT Screen 04/15/2019 03/21/2018  What Year? 0 points 0 points  What month? 0 points 0 points  What time? 0 points 0 points  Count back from 20 0 points 0 points  Months in reverse 0 points 0 points  Repeat phrase 2 points 0 points  Total Score 2 0    Immunizations Immunization History  Administered Date(s) Administered  . H1N1 10/06/2008  . Influenza, High Dose Seasonal PF 06/11/2019  . Influenza, Seasonal, Injecte, Preservative Fre 08/03/2009, 07/19/2013  . Influenza,inj,Quad PF,6+ Mos 07/21/2017, 06/18/2018  . Influenza-Unspecified 07/18/2002, 08/22/2003, 07/15/2004, 07/07/2005, 07/25/2006, 08/21/2007, 05/22/2008, 10/06/2008,  08/05/2010, 07/07/2011, 06/05/2012, 07/06/2014, 07/02/2015, 09/06/2015, 07/06/2016, 06/05/2017, 06/18/2018, 07/06/2018, 06/06/2019, 06/30/2020  . Moderna Sars-Covid-2 Vaccination 11/04/2019, 12/05/2019  . Pneumococcal Conjugate-13 01/28/2014, 05/28/2015  . Pneumococcal Polysaccharide-23 03/30/2001, 09/06/2010, 09/19/2011  . Tdap 09/06/2010, 09/19/2011  . Zoster 09/06/2010, 02/04/2011  . Zoster Recombinat (Shingrix) 08/07/2018, 10/12/2018    TDAP status: Up to date  Flu Vaccine status: Up to date  Pneumococcal vaccine status: Up to date  Covid-19 vaccine status: Completed vaccines  Qualifies for Shingles Vaccine? Yes   Zostavax completed Yes   Shingrix Completed?: Yes  Screening Tests Health Maintenance  Topic Date Due  . COVID-19 Vaccine (3 - Moderna risk 4-dose series) 01/02/2020  . TETANUS/TDAP  09/18/2021  . COLONOSCOPY (Pts 45-20yrs Insurance coverage will need to be confirmed)  03/10/2025  . INFLUENZA VACCINE  Completed  . Hepatitis C Screening  Completed  . PNA vac Low Risk Adult  Completed  . HPV VACCINES  Aged Out    Health Maintenance  Health Maintenance Due  Topic Date Due  . COVID-19 Vaccine (3 - Moderna risk 4-dose series) 01/02/2020    Colorectal cancer screening: Type of screening: Colonoscopy. Completed 03/10/20. Repeat every 5 years  Lung Cancer Screening: (Low Dose CT Chest recommended if Age 82-80 years, 30 pack-year currently smoking OR have quit w/in 15years.) does not qualify.   Additional Screening:  Hepatitis C Screening: does qualify; Completed 05/09/2019  Vision Screening: Recommended annual ophthalmology exams for early detection of glaucoma and other disorders of the eye. Is the patient up to date with their annual eye exam?  Yes  Who is the provider or what is the name of the office in which the patient attends annual eye exams? [place]  If pt is not established with a provider, would they like to be referred to a provider to establish  care? No .  Dental Screening: Recommended annual dental exams for proper oral hygiene  Community Resource Referral / Chronic Care Management: CRR required this visit?  No   CCM required this visit?  No      Plan:     I have personally reviewed and noted the following in the patient's chart:   . Medical and social history . Use of alcohol, tobacco or illicit drugs  . Current medications and supplements . Functional ability and status . Nutritional status . Physical activity . Advanced directives . List of other physicians . Hospitalizations, surgeries, and ER visits in previous 12 months . Vitals . Screenings to include cognitive, depression, and falls . Referrals and appointments  In addition, I have reviewed and discussed with patient certain preventive protocols, quality metrics, and best practice recommendations. A written personalized care plan for preventive services as well as general preventive health recommendations were provided to patient.     Sandrea Hammond, LPN   [id number]   Nurse Notes: None

## 2020-12-02 NOTE — Progress Notes (Signed)
Primary Care Physician: Glean Hess, MD  Primary Gastroenterologist:  Dr. [name]  Chief Complaint  Patient presents with  . change in bowel habits    Constipation/diarrhea    HPI: Timothy Scott is a 75 y.o. male here with a report of stomach troubles delineated as alternating diarrhea and constipation.  The patient had seen me in the past for a globus sensation.  The patient underwent a EGD and colonoscopy back in July 2021.  At that time the colonoscopy was being done for screening and he had one polyp that was removed.  The polyp was noted to be a tubular adenoma and biopsies of the stomach showed gastric intestinal metaplasia.  At that time the patient was recommended to follow-up with me.  The patient followed up on February of this year and the patient had seen neurology for his globus sensation.  The patient also had a report of a modified barium swallow and swallowing evaluation back in 2018 without any findings to explain his symptoms.  It was suggested that possibly increasing his anxiety medications may be helpful.  The patient was recommended to have gastric mapping of his intestinal metaplasia but was concerned about the pandemic and wanted to wait until things had improved. He says he has been having abdominal pain for a months and the diarrhea for for last few weeks.  Past Medical History:  Diagnosis Date  . Benign neoplasm of colon   . Chronic airway obstruction, not elsewhere classified   . DDD (degenerative disc disease), cervical   . Degeneration of cervical intervertebral disc   . Dysphagia   . Dysuria   . Hyperlipidemia   . 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 Lattice degeneration of peripheral retina   . Leg varices 03/10/2015  . Osteoarthrosis, unspecified whether generalized or localized, hand   . Polyp of transverse colon   . Posttraumatic stress disorder   . Sinus bradycardia 04/17/2014  . Varicose veins of bilateral  lower extremities with other complications   . Wears dentures    partial upper and lower    Current Outpatient Medications  Medication Sig Dispense Refill  . Calcium Carbonate-Vit D-Min (CALCIUM 1200 PO) Take 1 tablet by mouth daily.     . Carbidopa-Levodopa ER (SINEMET CR) 25-100 MG tablet controlled release Take 1 tablet by mouth daily.    . clonazePAM (KLONOPIN) 0.5 MG tablet Take 0.25 mg by mouth 2 (two) times daily.    . finasteride (PROSCAR) 5 MG tablet Take 5 mg by mouth as needed.     . fluticasone (FLONASE) 50 MCG/ACT nasal spray Place 2 sprays into both nostrils daily as needed for allergies or rhinitis.     Marland Kitchen glucosamine-chondroitin 500-400 MG tablet Take 1 tablet by mouth daily.    . Misc Natural Products (DAILY HERBS PROSTATE PO) Take 1 tablet by mouth daily.    . naproxen (NAPROSYN) 500 MG tablet Take 500 mg by mouth 2 (two) times daily as needed for mild pain or moderate pain.     Marland Kitchen omeprazole (PRILOSEC) 40 MG capsule Take 40 mg by mouth daily.     . tamsulosin (FLOMAX) 0.4 MG CAPS capsule Take 0.4 mg by mouth as needed.     . traZODone (DESYREL) 50 MG tablet Take 100 mg by mouth at bedtime.      No current facility-administered medications for this visit.    Allergies as of 12/02/2020 - Review Complete 10/22/2020  Allergen Reaction Noted  . Prazosin  07/28/2014    ROS:  General: Negative for anorexia, weight loss, fever, chills, fatigue, weakness. ENT: Negative for hoarseness, difficulty swallowing , nasal congestion. CV: Negative for chest pain, angina, palpitations, dyspnea on exertion, peripheral edema.  Respiratory: Negative for dyspnea at rest, dyspnea on exertion, cough, sputum, wheezing.  GI: See history of present illness. GU:  Negative for dysuria, hematuria, urinary incontinence, urinary frequency, nocturnal urination.  Endo: Negative for unusual weight change.    Physical Examination:   BP (!) 148/70   Pulse 70   Temp (!) 97.3 F (36.3 C)  (Temporal)   Ht 5\' 11"  (1.803 m)   Wt 168 lb (76.2 kg)   BMI 23.43 kg/m   General: Well-nourished, well-developed in no acute distress.  Eyes: No icterus. Conjunctivae pink. Lungs: Clear to auscultation bilaterally. Non-labored. Heart: Regular rate and rhythm, no murmurs rubs or gallops.  Abdomen: Bowel sounds are normal, nontender, nondistended, no hepatosplenomegaly or masses, no abdominal bruits or hernia , no rebound or guarding.   Extremities: No lower extremity edema. No clubbing or deformities. Neuro: Alert and oriented x 3.  Grossly intact. Skin: Warm and dry, no jaundice.   Psych: Alert and cooperative, normal mood and affect.  Labs:    Imaging Studies: No results found.  Assessment and Plan:   Timothy Scott is a 75 y.o. y/o male who comes in today with a history of alternating diarrhea constipation.  The patient had some medication change by his psychiatrist and has been more anxious.  The patient is shaking his foot and rapid hand movements and fidgeting.  The patient has been told that I have observed this and he agrees with him being anxious.  The patient has been told to increase fiber in his diet to help balance his alternating diarrhea and constipation.  The patient has been explained the plan and agrees with it.     [name], MD. Marval Regal    Note: This dictation was prepared with Dragon dictation along with smaller phrase technology. Any transcriptional errors that result from this process are unintentional.

## 2020-12-15 IMAGING — US US RENAL
1 series · 14 of 25 positions shown · non-contrast
Comparison: CT 10/31/2017.

CLINICAL DATA: Microscopic hematuria.

EXAM:
RENAL / URINARY TRACT ULTRASOUND COMPLETE

[Series 1: us renal · 14 of 53 slices shown]
[im 1/53]
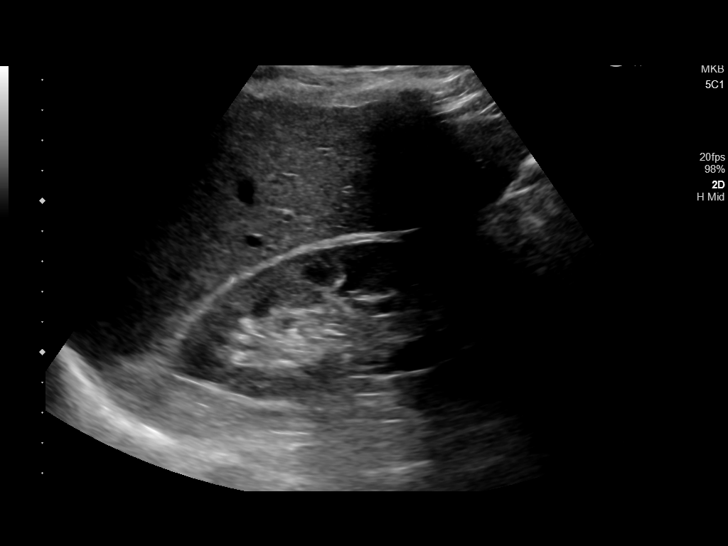
[im 5/53]
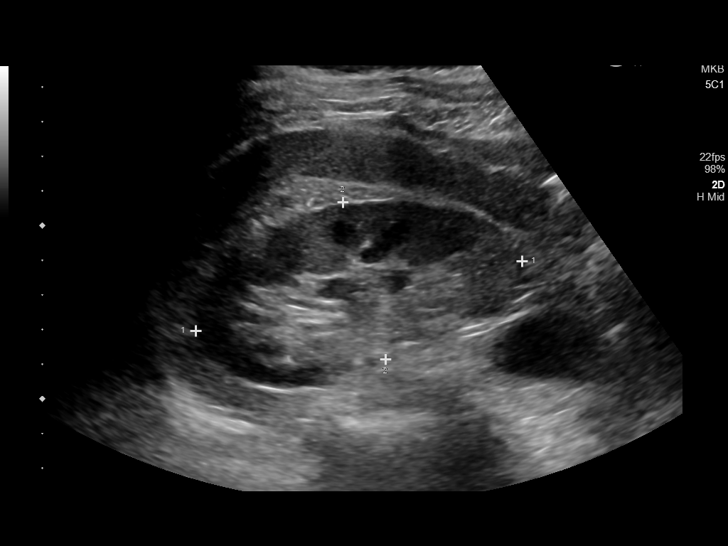
[im 9/53]
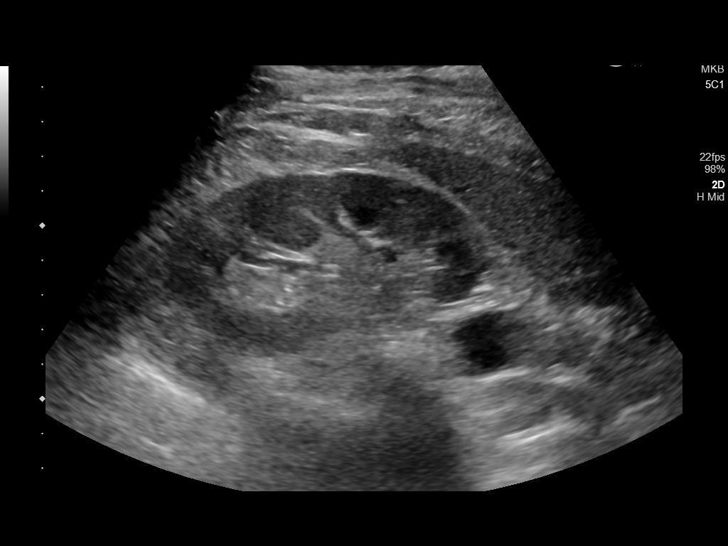
[im 14/53]
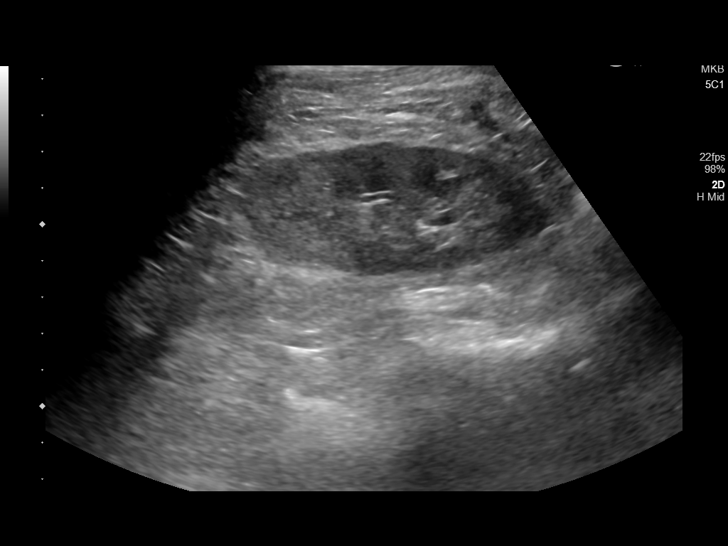
[im 18/53]
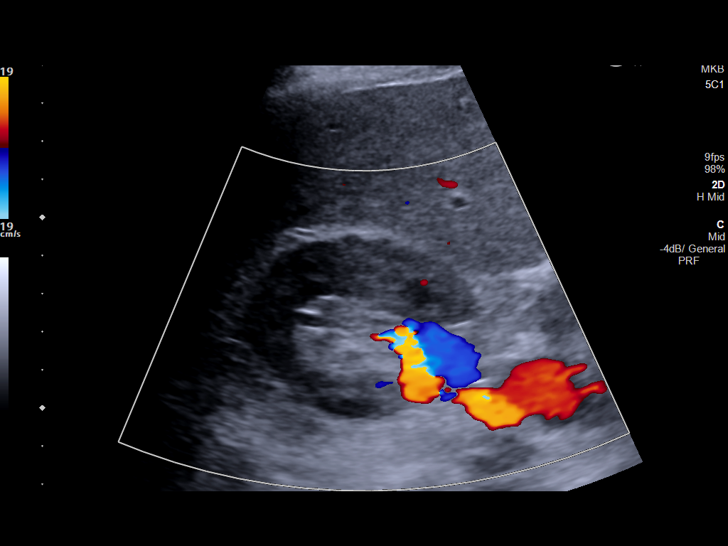
[im 20/53]
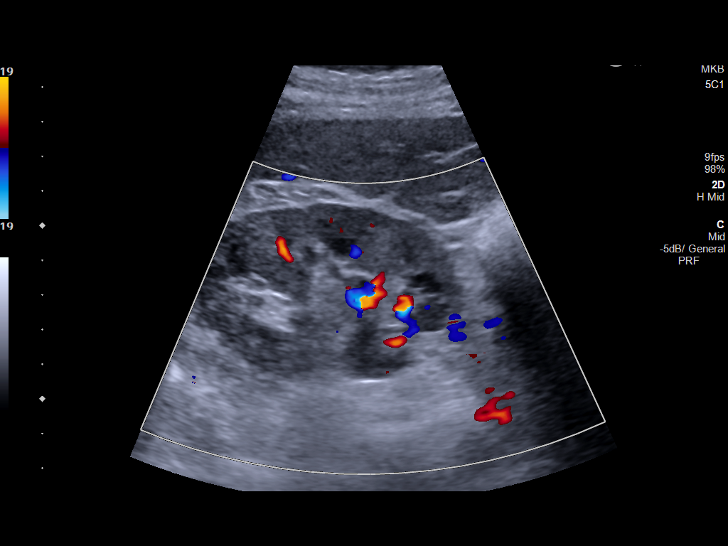
[im 24/53]
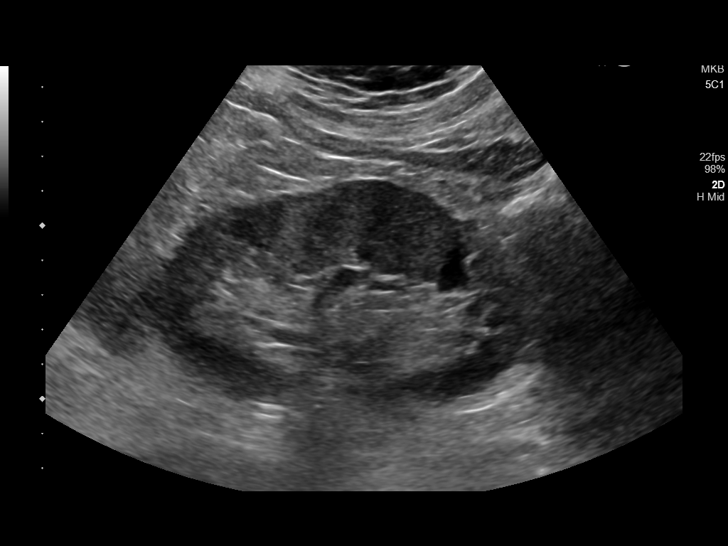
[im 29/53]
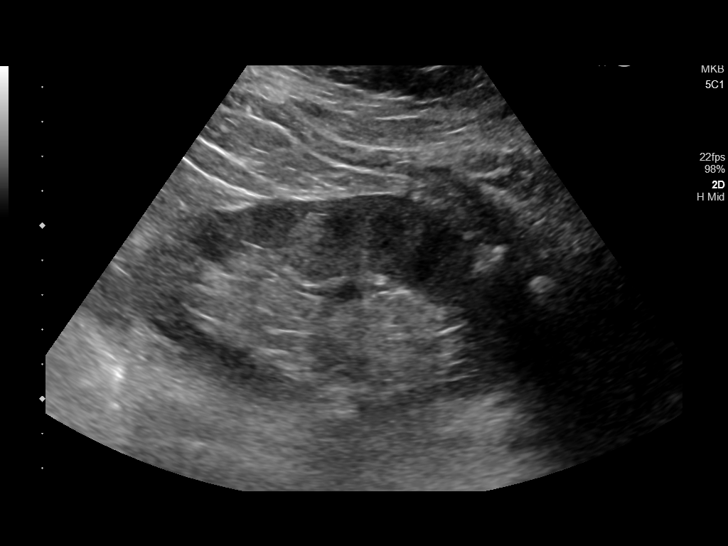
[im 33/53]
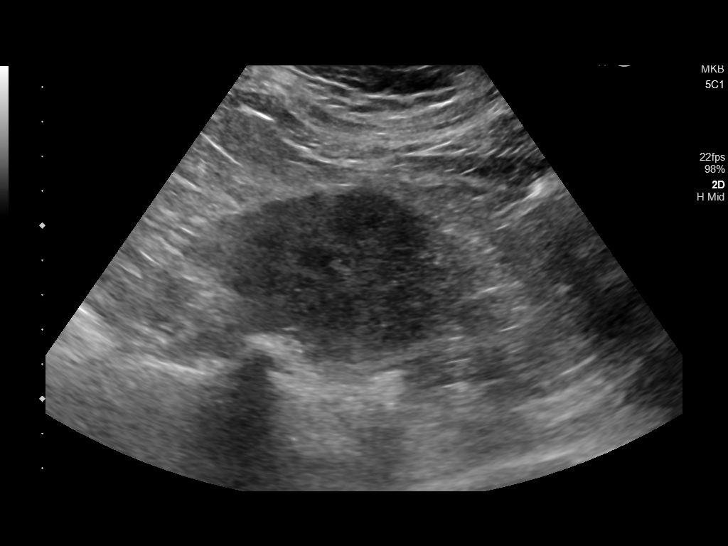
[im 35/53]
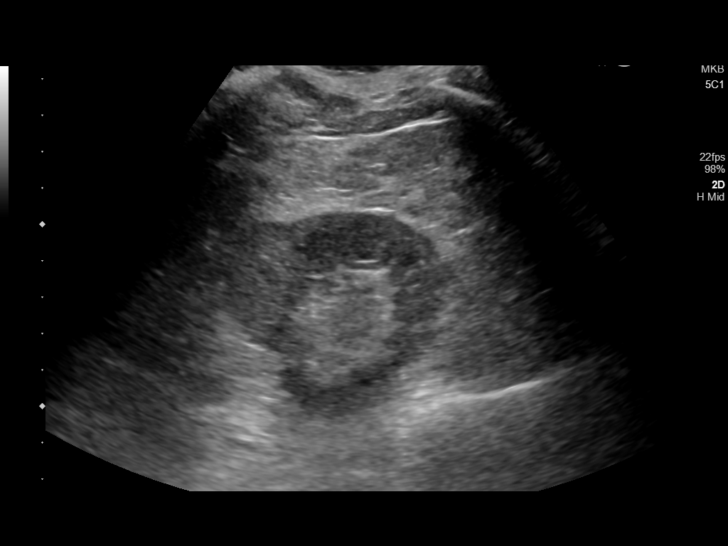
[im 40/53]
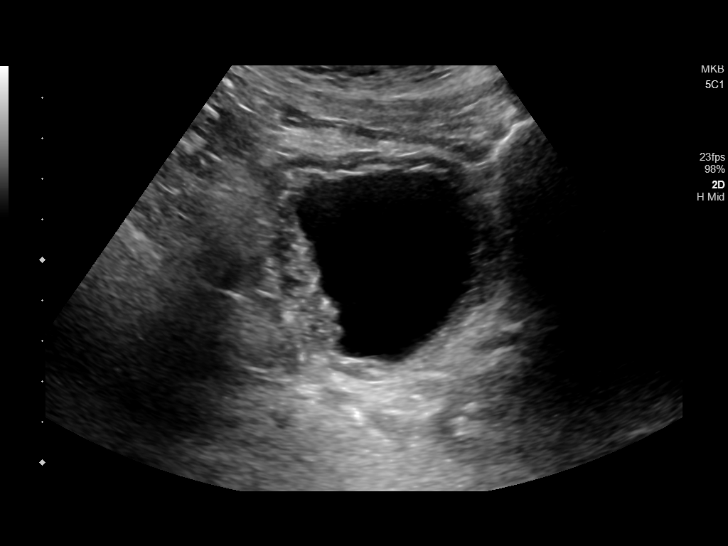
[im 44/53]
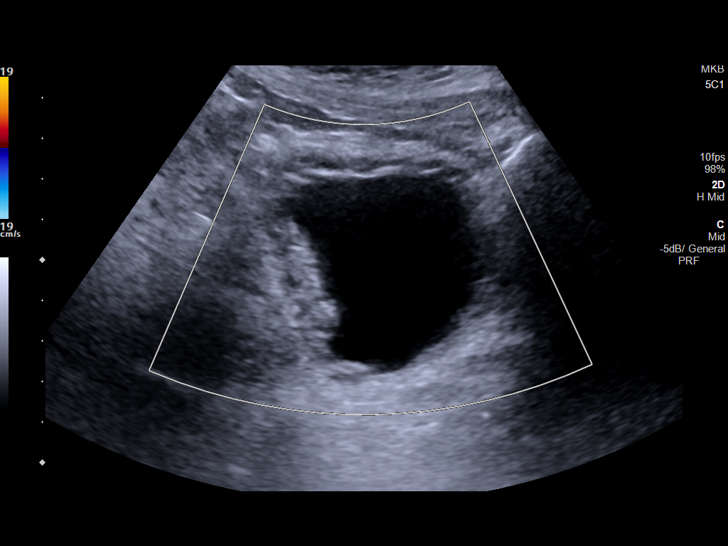
[im 48/53]
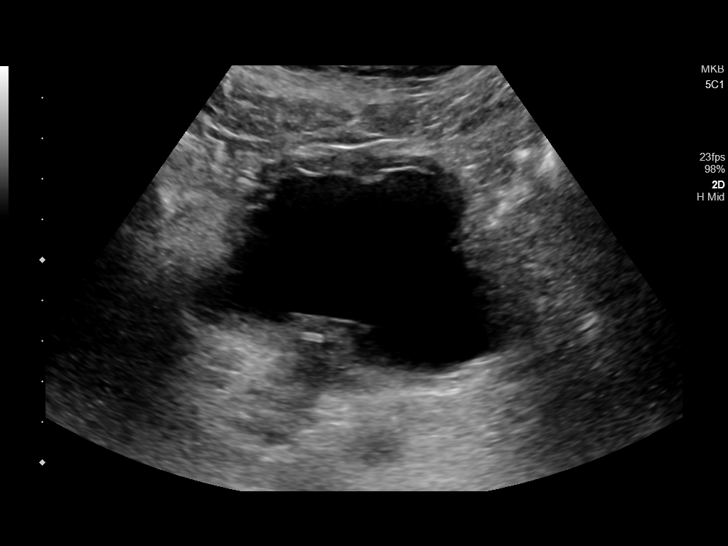
[im 53/53]
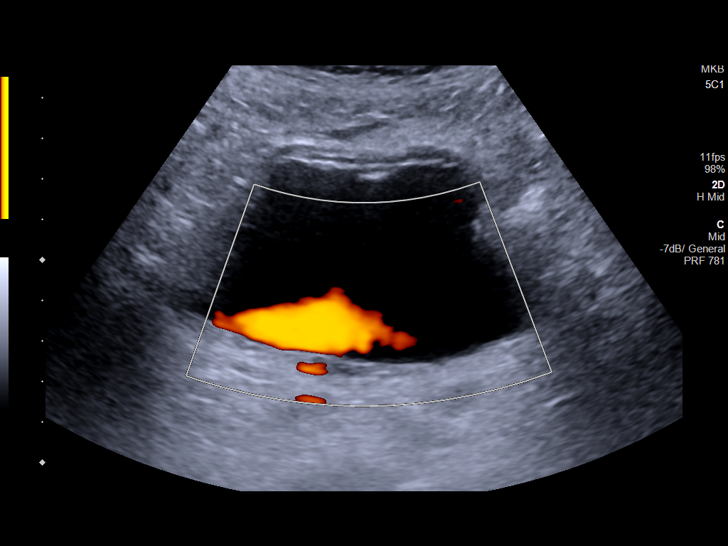

[14 of 25 positions shown; findings below may reference images not displayed]

FINDINGS: Right Kidney:

Renal measurements: 9.6 x 4.7 x 5.1 cm = volume: 121.8 mL .
Echogenicity within normal limits. No mass. Prominent extrarenal
pelvis again noted.

Left Kidney:

Renal measurements: 10.8 x 6.2 x 5.1 cm = volume: 178.8 mL.
Echogenicity within normal limits. No mass or hydronephrosis
visualized.

Bladder:

No bladder distention. Bilateral ureteral jets noted. Bladder wall
is again noted to be diffusely thickened at 0.7 cm. Prostate is
prominent and irregular.

Other:

None.
IMPRESSION: 1. No acute or focal renal abnormality identified. Right extrarenal
pelvis again noted.

2. No bladder distention. Bladder wall is again noted to be
diffusely thickened at 0.7 cm. Prostate is prominent and irregular.

## 2021-01-04 DIAGNOSIS — R42 Dizziness and giddiness: Secondary | ICD-10-CM | POA: Diagnosis not present

## 2021-01-04 DIAGNOSIS — I959 Hypotension, unspecified: Secondary | ICD-10-CM | POA: Diagnosis not present

## 2021-01-04 DIAGNOSIS — R0989 Other specified symptoms and signs involving the circulatory and respiratory systems: Secondary | ICD-10-CM | POA: Diagnosis not present

## 2021-01-05 DIAGNOSIS — Z03818 Encounter for observation for suspected exposure to other biological agents ruled out: Secondary | ICD-10-CM | POA: Diagnosis not present

## 2021-01-05 DIAGNOSIS — Z20822 Contact with and (suspected) exposure to covid-19: Secondary | ICD-10-CM | POA: Diagnosis not present

## 2021-01-08 ENCOUNTER — Ambulatory Visit: Payer: Self-pay | Admitting: *Deleted

## 2021-01-08 NOTE — Telephone Encounter (Signed)
Per initial encounter, "Pt has tested positive with at home testing kit, he says his lower back is sore and he is very concerned this could be pneumonia developing. Also has congestions and flu like symptoms. Wants to speak to nurse"; contacted pt to discuss his concerns; the pt states that he and his fiance went to a drive thru test on 10/14/50 and he tested negative for COVID but his fiance tested positive; he completed a home test on 01/07/21; he is experiencing lower back pain on both sides, sneezing, and runny nose; it is concerned that he has the same symptoms his neighbor's wife and she had pneumonia; tier of symptoms reviewed; recommendations made per nurse triage protocol; he verbalized understanding and would like to discuss this with Dr Army Melia; the pt can be contacted at [phone]; will route to office for provider notification.  Reason for Disposition . [1] HIGH RISK for severe COVID complications (e.g., weak immune system, age > 34 years, obesity with BMI > 25, pregnant, chronic lung disease or other chronic medical condition) AND [2] COVID symptoms (e.g., cough, fever)  (Exceptions: Already seen by PCP and no new or worsening symptoms.)  Answer Assessment - Initial Assessment Questions 1. COVID-19 DIAGNOSIS: "Who made your COVID-19 diagnosis?" "Was it confirmed by a positive lab test or self-test?" If not diagnosed by a doctor (or NP/PA), ask "Are there lots of cases (community spread) where you live?" Note: See public health department website, if unsure.     Home test 01/05/21 2. COVID-19 EXPOSURE: "Was there any known exposure to COVID before the symptoms began?" CDC Definition of close contact: within 6 feet (2 meters) for a total of 15 minutes or more over a 24-hour period.      Fiance tested positive 01/04/21 3. ONSET: "When did the COVID-19 symptoms start?"      01/03/21 4. WORST SYMPTOM: "What is your worst symptom?" (e.g., cough, fever, shortness of breath, muscle aches)     Lower back  soreness 5. COUGH: "Do you have a cough?" If Yes, ask: "How bad is the cough?"       yes 6. FEVER: "Do you have a fever?" If Yes, ask: "What is your temperature, how was it measured, and when did it start?"    no 7. RESPIRATORY STATUS: "Describe your breathing?" (e.g., shortness of breath, wheezing, unable to speak)      Denies symptoms 8. BETTER-SAME-WORSE: "Are you getting better, staying the same or getting worse compared to yesterday?"  If getting worse, ask, "In what way?"    better 9. HIGH RISK DISEASE: "Do you have any chronic medical problems?" (e.g., asthma, heart or lung disease, weak immune system, obesity, etc.)     Elderly 10. VACCINE: "Have you had the COVID-19 vaccine?" If Yes, ask: "Which one, how many shots, when did you get it?"       Moderna x 2; 11/04/19 and 12/05/19 11. BOOSTER: "Have you received your COVID-19 booster?" If Yes, ask: "Which one and when did you get it?"       Moderna x 1 07/14/20 12. PREGNANCY: "Is there any chance you are pregnant?" "When was your last menstrual period?"   n/a 13. OTHER SYMPTOMS: "Do you have any other symptoms?"  (e.g., chills, fatigue, headache, loss of smell or taste, muscle pain, sore throat)       Sneezing, runny nose 14. O2 SATURATION MONITOR:  "Do you use an oxygen saturation monitor (pulse oximeter) at home?" If Yes, ask "What is your reading (  oxygen level) today?" "What is your usual oxygen saturation reading?" (e.g., 95%)  Protocols used: CORONAVIRUS (COVID-19) DIAGNOSED OR SUSPECTED-A-AH

## 2021-01-08 NOTE — Telephone Encounter (Signed)
Spoke with patient and advised that Dr. Army Melia is out of the office.  Notified patient if symptoms persist or worsen he should be seen by Urgent Care or the ER for further evaluations.  Advised patient to drink plenty of fluids, stay up and moving, use cough and deep breathing exercises for relief, and to also monitor vitals, especially O2 levels.  No further questions at this time.  For your information.

## 2021-01-09 NOTE — Telephone Encounter (Signed)
I tried to call him to offer the anti-viral therapy but call went to voice mail.

## 2021-02-10 DIAGNOSIS — D3131 Benign neoplasm of right choroid: Secondary | ICD-10-CM | POA: Diagnosis not present

## 2021-02-11 DIAGNOSIS — R55 Syncope and collapse: Secondary | ICD-10-CM | POA: Diagnosis not present

## 2021-02-11 DIAGNOSIS — R001 Bradycardia, unspecified: Secondary | ICD-10-CM | POA: Diagnosis not present

## 2021-02-11 DIAGNOSIS — R0602 Shortness of breath: Secondary | ICD-10-CM | POA: Diagnosis not present

## 2021-02-11 DIAGNOSIS — I208 Other forms of angina pectoris: Secondary | ICD-10-CM | POA: Diagnosis not present

## 2021-02-11 DIAGNOSIS — I495 Sick sinus syndrome: Secondary | ICD-10-CM | POA: Diagnosis not present

## 2021-02-11 DIAGNOSIS — Z720 Tobacco use: Secondary | ICD-10-CM | POA: Diagnosis not present

## 2021-03-22 DIAGNOSIS — R001 Bradycardia, unspecified: Secondary | ICD-10-CM | POA: Diagnosis not present

## 2021-03-22 DIAGNOSIS — Z720 Tobacco use: Secondary | ICD-10-CM | POA: Diagnosis not present

## 2021-03-22 DIAGNOSIS — R55 Syncope and collapse: Secondary | ICD-10-CM | POA: Diagnosis not present

## 2021-03-22 DIAGNOSIS — R0602 Shortness of breath: Secondary | ICD-10-CM | POA: Diagnosis not present

## 2021-03-22 DIAGNOSIS — I6523 Occlusion and stenosis of bilateral carotid arteries: Secondary | ICD-10-CM | POA: Diagnosis not present

## 2021-03-22 DIAGNOSIS — I208 Other forms of angina pectoris: Secondary | ICD-10-CM | POA: Diagnosis not present

## 2021-03-23 DIAGNOSIS — R55 Syncope and collapse: Secondary | ICD-10-CM | POA: Diagnosis not present

## 2021-03-23 DIAGNOSIS — R001 Bradycardia, unspecified: Secondary | ICD-10-CM | POA: Diagnosis not present

## 2021-03-23 DIAGNOSIS — I495 Sick sinus syndrome: Secondary | ICD-10-CM | POA: Diagnosis not present

## 2021-03-23 DIAGNOSIS — I208 Other forms of angina pectoris: Secondary | ICD-10-CM | POA: Diagnosis not present

## 2021-04-06 DIAGNOSIS — R001 Bradycardia, unspecified: Secondary | ICD-10-CM | POA: Diagnosis not present

## 2021-04-06 DIAGNOSIS — R55 Syncope and collapse: Secondary | ICD-10-CM | POA: Diagnosis not present

## 2021-04-13 DIAGNOSIS — R42 Dizziness and giddiness: Secondary | ICD-10-CM | POA: Diagnosis not present

## 2021-04-13 DIAGNOSIS — R0989 Other specified symptoms and signs involving the circulatory and respiratory systems: Secondary | ICD-10-CM | POA: Diagnosis not present

## 2021-04-13 DIAGNOSIS — H538 Other visual disturbances: Secondary | ICD-10-CM | POA: Diagnosis not present

## 2021-04-13 DIAGNOSIS — R5383 Other fatigue: Secondary | ICD-10-CM | POA: Diagnosis not present

## 2021-05-11 DIAGNOSIS — E538 Deficiency of other specified B group vitamins: Secondary | ICD-10-CM | POA: Diagnosis not present

## 2021-05-11 DIAGNOSIS — H538 Other visual disturbances: Secondary | ICD-10-CM | POA: Diagnosis not present

## 2021-05-11 DIAGNOSIS — R531 Weakness: Secondary | ICD-10-CM | POA: Diagnosis not present

## 2021-05-11 DIAGNOSIS — R5383 Other fatigue: Secondary | ICD-10-CM | POA: Diagnosis not present

## 2021-05-12 ENCOUNTER — Encounter: Payer: PPO | Admitting: Speech Pathology

## 2021-05-20 ENCOUNTER — Ambulatory Visit
Admission: RE | Admit: 2021-05-20 | Discharge: 2021-05-20 | Disposition: A | Discharge status: Home / Self Care | Payer: PPO | Attending: Internal Medicine | Admitting: Internal Medicine

## 2021-05-20 ENCOUNTER — Ambulatory Visit
Admission: RE | Admit: 2021-05-20 | Discharge: 2021-05-20 | Disposition: A | Discharge status: Home / Self Care | Payer: PPO | Source: Ambulatory Visit | Attending: Internal Medicine | Admitting: Internal Medicine

## 2021-05-20 ENCOUNTER — Other Ambulatory Visit
Admission: RE | Admit: 2021-05-20 | Discharge: 2021-05-20 | Disposition: A | Discharge status: Home / Self Care | Payer: PPO | Source: Home / Self Care

## 2021-05-20 ENCOUNTER — Other Ambulatory Visit: Payer: Self-pay

## 2021-05-20 ENCOUNTER — Encounter: Payer: Self-pay | Admitting: Internal Medicine

## 2021-05-20 ENCOUNTER — Ambulatory Visit (INDEPENDENT_AMBULATORY_CARE_PROVIDER_SITE_OTHER): Payer: PPO | Admitting: Internal Medicine

## 2021-05-20 VITALS — BP 122/72 | HR 60 | Temp 97.9°F | Ht 71.0 in | Wt 166.0 lb

## 2021-05-20 DIAGNOSIS — Z0000 Encounter for general adult medical examination without abnormal findings: Secondary | ICD-10-CM

## 2021-05-20 DIAGNOSIS — I6522 Occlusion and stenosis of left carotid artery: Secondary | ICD-10-CM

## 2021-05-20 DIAGNOSIS — I495 Sick sinus syndrome: Secondary | ICD-10-CM

## 2021-05-20 DIAGNOSIS — N138 Other obstructive and reflux uropathy: Secondary | ICD-10-CM | POA: Diagnosis not present

## 2021-05-20 DIAGNOSIS — R569 Unspecified convulsions: Secondary | ICD-10-CM

## 2021-05-20 DIAGNOSIS — J439 Emphysema, unspecified: Secondary | ICD-10-CM

## 2021-05-20 DIAGNOSIS — Z8673 Personal history of transient ischemic attack (TIA), and cerebral infarction without residual deficits: Secondary | ICD-10-CM

## 2021-05-20 DIAGNOSIS — N401 Enlarged prostate with lower urinary tract symptoms: Secondary | ICD-10-CM | POA: Diagnosis not present

## 2021-05-20 DIAGNOSIS — M7989 Other specified soft tissue disorders: Secondary | ICD-10-CM | POA: Diagnosis not present

## 2021-05-20 DIAGNOSIS — M25541 Pain in joints of right hand: Secondary | ICD-10-CM

## 2021-05-20 DIAGNOSIS — K31A0 Gastric intestinal metaplasia, unspecified: Secondary | ICD-10-CM | POA: Insufficient documentation

## 2021-05-20 DIAGNOSIS — F4310 Post-traumatic stress disorder, unspecified: Secondary | ICD-10-CM

## 2021-05-20 DIAGNOSIS — M19041 Primary osteoarthritis, right hand: Secondary | ICD-10-CM | POA: Diagnosis not present

## 2021-05-20 LAB — CBC WITH DIFFERENTIAL/PLATELET
Abs Immature Granulocytes: 0.03 10*3/uL (ref 0.00–0.07)
Basophils Absolute: 0.1 10*3/uL (ref 0.0–0.1)
Basophils Relative: 1 %
Eosinophils Absolute: 0.1 10*3/uL (ref 0.0–0.5)
Eosinophils Relative: 1 %
HCT: 40.1 % (ref 39.0–52.0)
Hemoglobin: 14 g/dL (ref 13.0–17.0)
Immature Granulocytes: 0 %
Lymphocytes Relative: 14 %
Lymphs Abs: 1.1 10*3/uL (ref 0.7–4.0)
MCH: 33.1 pg (ref 26.0–34.0)
MCHC: 34.9 g/dL (ref 30.0–36.0)
MCV: 94.8 fL (ref 80.0–100.0)
Monocytes Absolute: 0.9 10*3/uL (ref 0.1–1.0)
Monocytes Relative: 11 %
Neutro Abs: 6.1 10*3/uL (ref 1.7–7.7)
Neutrophils Relative %: 73 %
Platelets: 290 10*3/uL (ref 150–400)
RBC: 4.23 MIL/uL (ref 4.22–5.81)
RDW: 12.6 % (ref 11.5–15.5)
WBC: 8.3 10*3/uL (ref 4.0–10.5)
nRBC: 0 % (ref 0.0–0.2)

## 2021-05-20 LAB — LIPID PANEL
Cholesterol: 168 mg/dL (ref 0–200)
HDL: 68 mg/dL (ref >40)
LDL Cholesterol: 86 mg/dL (ref 0–99)
Total CHOL/HDL Ratio: 2.5 RATIO
Triglycerides: 70 mg/dL (ref <150)
VLDL: 14 mg/dL (ref 0–40)

## 2021-05-20 LAB — COMPREHENSIVE METABOLIC PANEL
ALT: 20 U/L (ref 0–44)
AST: 23 U/L (ref 15–41)
Albumin: 4 g/dL (ref 3.5–5.0)
Alkaline Phosphatase: 40 U/L (ref 38–126)
Anion gap: 6 (ref 5–15)
BUN: 17 mg/dL (ref 8–23)
CO2: 26 mmol/L (ref 22–32)
Calcium: 9.1 mg/dL (ref 8.9–10.3)
Chloride: 103 mmol/L (ref 98–111)
Creatinine, Ser: 0.78 mg/dL (ref 0.61–1.24)
GFR, Estimated: >60 mL/min (ref >60)
Glucose, Bld: 109 mg/dL — ABNORMAL HIGH (ref 70–99)
Potassium: 4.1 mmol/L (ref 3.5–5.1)
Sodium: 135 mmol/L (ref 135–145)
Total Bilirubin: 0.9 mg/dL (ref 0.3–1.2)
Total Protein: 6.7 g/dL (ref 6.5–8.1)

## 2021-05-20 LAB — PSA: Prostatic Specific Antigen: 0.36 ng/mL (ref 0.00–4.00)

## 2021-05-20 NOTE — Progress Notes (Signed)
Date:  05/20/2021   Name:  Timothy Scott   DOB:  02-07-1946   MRN:  [id number]   Chief Complaint: Annual Exam Timothy Scott is a 75 y.o. male who presents today for his Complete Annual Exam. He feels well. He reports exercising none. He reports he is sleeping well.   Colonoscopy: 03/2020 UGI: 03/2020 intestinal metaplasia  Immunization History  Administered Date(s) Administered   H1N1 10/06/2008   Influenza, High Dose Seasonal PF 06/11/2019   Influenza, Seasonal, Injecte, Preservative Fre 08/03/2009, 07/19/2013   Influenza,inj,Quad PF,6+ Mos 07/21/2017, 06/18/2018   Influenza-Unspecified 07/18/2002, 08/22/2003, 07/15/2004, 07/07/2005, 07/25/2006, 08/21/2007, 05/22/2008, 10/06/2008, 08/05/2010, 07/07/2011, 06/05/2012, 07/06/2014, 07/02/2015, 09/06/2015, 07/06/2016, 06/05/2017, 06/18/2018, 07/06/2018, 06/06/2019, 06/30/2020   Moderna Sars-Covid-2 Vaccination 11/04/2019, 12/05/2019, 07/14/2020, 12/29/2020   Pneumococcal Conjugate-13 01/28/2014, 05/28/2015   Pneumococcal Polysaccharide-23 03/30/2001, 09/06/2010, 09/19/2011   Tdap 09/06/2010, 09/19/2011   Zoster Recombinat (Shingrix) 08/07/2018, 10/12/2018   Zoster, Live 09/06/2010, 02/04/2011    HPI  Lab Results  Component Value Date   CREATININE 0.83 05/14/2020   BUN 15 05/14/2020   NA 137 05/14/2020   K 4.1 05/14/2020   CL 103 05/14/2020   CO2 26 05/14/2020   Lab Results  Component Value Date   CHOL 170 05/14/2020   HDL 63 05/14/2020   LDLCALC 96 05/14/2020   TRIG 53 05/14/2020   CHOLHDL 2.7 05/14/2020   Lab Results  Component Value Date   TSH 1.50 04/13/2020   Lab Results  Component Value Date   HGBA1C 5.1 06/01/2019   Lab Results  Component Value Date   WBC 8.8 05/14/2020   HGB 14.6 05/14/2020   HCT 41.9 05/14/2020   MCV 96.1 05/14/2020   PLT 298 05/14/2020   Lab Results  Component Value Date   ALT 19 05/14/2020   AST 20 05/14/2020   ALKPHOS 42 05/14/2020   BILITOT 1.0 05/14/2020     Review  of Systems  Constitutional:  Negative for appetite change, chills, diaphoresis, fatigue and unexpected weight change.  HENT:  Positive for trouble swallowing. Negative for hearing loss, tinnitus and voice change.   Eyes:  Negative for visual disturbance.  Respiratory:  Negative for choking, shortness of breath and wheezing.        Has not used inhaler in about one year   Cardiovascular:  Negative for chest pain, palpitations and leg swelling.  Gastrointestinal:  Negative for abdominal pain, blood in stool, constipation and diarrhea.  Genitourinary:  Negative for difficulty urinating, dysuria and frequency.  Musculoskeletal:  Negative for arthralgias, back pain and myalgias.  Skin:  Negative for color change and rash.  Neurological:  Positive for dizziness. Negative for tremors, syncope, weakness and headaches.  Hematological:  Negative for adenopathy.  Psychiatric/Behavioral:  Negative for dysphoric mood and sleep disturbance. The patient is not nervous/anxious.    Patient Active Problem List   Diagnosis Date Noted   Intestinal metaplasia of gastric mucosa 05/20/2021   Senile nuclear sclerosis 12/02/2020   Restless legs syndrome 12/02/2020   Osteoarthritis of hip 12/02/2020   Myopia 12/02/2020   Esophageal reflux 12/02/2020   Hearing loss 12/02/2020   Actinic keratosis 12/02/2020   Chronic obstructive lung disease ([place]) 12/02/2020   Chronic neck pain 12/02/2020   Other ill-defined and unknown causes of morbidity and mortality 12/02/2020   Neurocardiogenic pre-syncope 10/13/2020   Seizure-like activity ([place]) 07/06/2020   Dietary B12 deficiency 05/14/2020   Headache disorder 04/13/2020   Other diseases of stomach and duodenum    Hx  of transient ischemic attack (TIA) 01/28/2020   Elevated left ventricular end-diastolic pressure (LVEDP) 06/01/2019   Moderate aortic regurgitation 06/01/2019   Carotid artery plaque, left 07/09/2018   Sick sinus syndrome ([place]) 03/09/2017   Anosmia  05/05/2016   Dysphagia 02/03/2016   Tobacco use disorder, moderate, in sustained remission 03/11/2015   Lumbar disc herniation with radiculopathy 03/11/2015   Degeneration of intervertebral disc 03/10/2015   BPH with obstruction/lower urinary tract symptoms 03/10/2015   Degenerative arthritis of finger 03/10/2015   Post-traumatic stress disorder 03/10/2015   Lattice degeneration 03/10/2015   Leg varices 03/10/2015    Allergies  Allergen Reactions   Prazosin     Other reaction(s): Dizziness    Past Surgical History:  Procedure Laterality Date   CATARACT EXTRACTION Right 2015   CATARACT EXTRACTION W/PHACO Left 10/21/2015   Procedure: CATARACT EXTRACTION PHACO AND INTRAOCULAR LENS PLACEMENT ([place]);  Surgeon: Leandrew Koyanagi, MD;  Location: [facility];  Service: Ophthalmology;  Laterality: Left;   CATARACT EXTRACTION, BILATERAL  2016   COLONOSCOPY  2014   benign polyps   COLONOSCOPY WITH PROPOFOL N/A 03/10/2020   Procedure: COLONOSCOPY WITH PROPOFOL;  Surgeon: [name], MD;  Location: [facility] ENDOSCOPY;  Service: Endoscopy;  Laterality: N/A;  PRIORITY 4   CYSTOSCOPY     ESOPHAGEAL MANOMETRY N/A 08/03/2016   Procedure: ESOPHAGEAL MANOMETRY (EM);  Surgeon: Mauri Pole, MD;  Location: [facility] ENDOSCOPY;  Service: Endoscopy;  Laterality: N/A;   ESOPHAGOGASTRODUODENOSCOPY     ESOPHAGOGASTRODUODENOSCOPY (EGD) WITH PROPOFOL N/A 02/17/2016   with esoph dilatation   ESOPHAGOGASTRODUODENOSCOPY (EGD) WITH PROPOFOL N/A 03/10/2020   Procedure: ESOPHAGOGASTRODUODENOSCOPY (EGD) WITH PROPOFOL;  Surgeon: [name], MD;  Location[facility] ENDOSCOPY;  Service: Endoscopy;  Laterality: N/A;   TONSILLECTOMY      Social History   Tobacco Use   Smoking status: Former    Packs/day: 0.50    Years: 20.00    Pack years: 10.00    Types: Cigarettes, Pipe, Cigars    Quit date: 04/05/1986    Years since quitting: 35.1   Smokeless tobacco: Never   Tobacco comments:    smoking cessation materials  not required  Vaping Use   Vaping Use: Never used  Substance Use Topics   Alcohol use: Yes    Alcohol/week: 6.0 standard drinks    Types: 6 Standard drinks or equivalent per week    Comment: socially   Drug use: No     Medication list has been reviewed and updated.  Current Meds  Medication Sig   buPROPion (WELLBUTRIN SR) 150 MG 12 hr tablet TAKE ONE TABLET BY MOUTH TWO TIMES A DAY (FOR POSTTRAUMATIC STRESS DISORDER AND MOOD)   Calcium Carbonate-Vit D-Min (CALCIUM 1200 PO) Take 1 tablet by mouth daily.    cyanocobalamin 1000 MCG tablet Take by mouth.   finasteride (PROSCAR) 5 MG tablet Take 5 mg by mouth as needed.    glucosamine-chondroitin 500-400 MG tablet Take 1 tablet by mouth daily.   Misc Natural Products (DAILY HERBS PROSTATE PO) Take 1 tablet by mouth daily.   Multiple Vitamins-Minerals (ZINC PO) Take by mouth daily.   naproxen (NAPROSYN) 500 MG tablet Take 500 mg by mouth 2 (two) times daily as needed for mild pain or moderate pain.    omeprazole (PRILOSEC) 40 MG capsule Take 40 mg by mouth daily.    risperiDONE (RISPERDAL) 0.25 MG tablet TAKE ONE-HALF TABLET BY MOUTH ONCE EVERY DAY AS NEEDED (FOR SLEEP, IRRITABILITY AND ANXIOUSNESS)   tamsulosin (FLOMAX) 0.4 MG  CAPS capsule Take 0.4 mg by mouth as needed.    traZODone (DESYREL) 100 MG tablet TAKE TWO TABLETS BY MOUTH AT BEDTIME FOR SLEEP. (YOU MAY TAKE A 3RD TABLET IF NEEDED, IF YOU WAKE UP DURING THE NIGHT)   [DISCONTINUED] nortriptyline (PAMELOR) 10 MG capsule SMARTSIG:1 Capsule(s) By Mouth Every Evening   [DISCONTINUED] saw palmetto 500 MG capsule Take 500 mg by mouth daily.    PHQ 2/9 Scores 05/20/2021 12/02/2020 04/01/2020 01/28/2020  PHQ - 2 Score 0 3 2 0  PHQ- 9 Score 0 10 11 0    GAD 7 : Generalized Anxiety Score 05/20/2021 04/01/2020 01/28/2020  Nervous, Anxious, on Edge 0 1 2  Control/stop worrying 0 2 2  Worry too much - different things 0 2 2  Trouble relaxing 0 2 2  Restless 0 1 3  Easily annoyed or  irritable 0 2 3  Afraid - awful might happen 0 1 3  Total GAD 7 Score 0 11 17  Anxiety Difficulty - Not difficult at all Somewhat difficult    BP Readings from Last 3 Encounters:  05/20/21 122/72  12/02/20 (!) 148/70  10/22/20 120/61    Physical Exam Vitals and nursing note reviewed.  Constitutional:      Appearance: Normal appearance. He is well-developed.  HENT:     Head: Normocephalic.     Right Ear: Tympanic membrane, ear canal and external ear normal.     Left Ear: Tympanic membrane, ear canal and external ear normal.     Nose: Nose normal.  Eyes:     Conjunctiva/sclera: Conjunctivae normal.     Pupils: Pupils are equal, round, and reactive to light.  Neck:     Thyroid: No thyromegaly.     Vascular: No carotid bruit.  Cardiovascular:     Rate and Rhythm: Normal rate and regular rhythm.     Heart sounds: Normal heart sounds.  Pulmonary:     Effort: Pulmonary effort is normal.     Breath sounds: Normal breath sounds. No wheezing.  Chest:  Breasts:    Right: No mass.     Left: No mass.  Abdominal:     General: Bowel sounds are normal.     Palpations: Abdomen is soft.     Tenderness: There is no abdominal tenderness.  Musculoskeletal:     Right hand: Swelling, deformity and bony tenderness present. Decreased range of motion.     Left hand: Swelling, deformity and bony tenderness present. Decreased range of motion.     Cervical back: Normal range of motion and neck supple.     Comments: MCP and PIP of several digits  Lymphadenopathy:     Cervical: No cervical adenopathy.  Skin:    General: Skin is warm and dry.  Neurological:     Mental Status: He is alert and oriented to person, place, and time.     Deep Tendon Reflexes: Reflexes are normal and symmetric.  Psychiatric:        Attention and Perception: Attention normal.        Mood and Affect: Mood normal.        Thought Content: Thought content normal.    Wt Readings from Last 3 Encounters:  05/20/21 166  lb (75.3 kg)  12/02/20 167 lb (75.8 kg)  12/02/20 168 lb (76.2 kg)    BP 122/72   Pulse 60   Temp 97.9 F (36.6 C) (Oral)   Ht '5\' 11"'$  (1.803 m)   Wt 166 lb (75.3 kg)  SpO2 99%   BMI 23.15 kg/m   Assessment and Plan: 1. Annual physical exam Normal exam for age Continue healthy diet, more regular exercise is recommended  2. Hx of transient ischemic attack (TIA) Previously on aspirin and statin therapy but patient self discontinued these in 12/2019 Will recommend BP monitoring and lipid control. Will discuss aspirin next visit  3. Carotid artery plaque, left Previously on atorvastatin but stopped in 2021 - Lipid panel  4. Sick sinus syndrome ([place]) Followed by cardiology. Low BP has improved with fluids and liberal salt intake - CBC with Differential/Platelet - Comprehensive metabolic panel  5. BPH with obstruction/lower urinary tract symptoms Patient report minimal symptoms Continue medication as prescribed by Urology. - PSA  6. Post-traumatic stress disorder Followed by [facility]  7. Pulmonary emphysema, unspecified emphysema type (HCC) Stable, no flares or inhaler use over the past year.  8. Seizure-like activity [facility]) Per Neurology evaluation for "sinking episodes" Not on medication currently - he has a follow up soon  9. Intestinal metaplasia of gastric mucosa Seen on UGI last year Recommend repeat in 2-3 years  10. Joint pain in fingers of right hand - ANA w/Reflex if Positive - Rheumatoid factor - DG Hand Complete Right   Partially dictated using Editor, commissioning. Any errors are unintentional.  Halina Maidens, MD [facility]  05/20/2021

## 2021-05-21 LAB — ANA W/REFLEX IF POSITIVE: Anti Nuclear Antibody (ANA): NEGATIVE

## 2021-05-21 LAB — RHEUMATOID FACTOR: Rheumatoid fact SerPl-aCnc: <10 IU/mL (ref <14.0)

## 2021-05-21 NOTE — Progress Notes (Signed)
Informed pt with labs.Pt wants to know if he should see rheumatology. Pt verbalized understanding.  KP

## 2021-05-24 DIAGNOSIS — H538 Other visual disturbances: Secondary | ICD-10-CM | POA: Diagnosis not present

## 2021-05-24 DIAGNOSIS — R0989 Other specified symptoms and signs involving the circulatory and respiratory systems: Secondary | ICD-10-CM | POA: Diagnosis not present

## 2021-05-24 DIAGNOSIS — R42 Dizziness and giddiness: Secondary | ICD-10-CM | POA: Diagnosis not present

## 2021-05-24 DIAGNOSIS — I959 Hypotension, unspecified: Secondary | ICD-10-CM | POA: Diagnosis not present

## 2021-05-24 NOTE — Progress Notes (Signed)
Informed pt to use topical rubs when painful and use hands for mobility. Pt verbalized understanding.  KP

## 2021-06-14 ENCOUNTER — Other Ambulatory Visit: Payer: Self-pay

## 2021-06-14 ENCOUNTER — Ambulatory Visit: Payer: PPO | Attending: Neurology | Admitting: Speech Pathology

## 2021-06-14 DIAGNOSIS — R1314 Dysphagia, pharyngoesophageal phase: Secondary | ICD-10-CM | POA: Diagnosis not present

## 2021-06-15 ENCOUNTER — Encounter: Payer: PPO | Admitting: Speech Pathology

## 2021-06-15 NOTE — Patient Instructions (Signed)
Elevate head of bed  Increase consumption of water Follow up with ENT

## 2021-06-15 NOTE — Therapy (Signed)
[place] MAIN [facility] SERVICES [address] [place], [place], [address] Phone: [phone]   Fax:  [phone]  Speech Language Pathology Evaluation  Patient Details  Name: Timothy Scott MRN: [id number] Date of Birth: 05-Jun-1946 Referring Provider (SLP): Gurney Maxin   Encounter Date: 06/14/2021   End of Session - 06/15/21 0836     Visit Number 1    Number of Visits 1    Authorization Type [employer]    SLP Start Time 0805    SLP Stop Time  0900    SLP Time Calculation (min) 55 min    Activity Tolerance Patient tolerated treatment well             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    Lattice degeneration of peripheral retina    Leg varices 03/10/2015   Osteoarthrosis, unspecified whether generalized or localized, hand    Polyp of transverse colon    Posttraumatic stress disorder    Sinus bradycardia 04/17/2014   Varicose veins of bilateral lower extremities with other complications    Wears dentures    partial upper and lower    Past Surgical History:  Procedure Laterality Date   CATARACT EXTRACTION Right 2015   CATARACT EXTRACTION W/PHACO Left 10/21/2015   Procedure: CATARACT EXTRACTION PHACO AND INTRAOCULAR LENS PLACEMENT (Phil Campbell);  Surgeon: Leandrew Koyanagi, MD;  Location: [place];  Service: Ophthalmology;  Laterality: Left;   CATARACT EXTRACTION, BILATERAL  2016   COLONOSCOPY  2014   benign polyps   COLONOSCOPY WITH PROPOFOL N/A 03/10/2020   Procedure: COLONOSCOPY WITH PROPOFOL;  Surgeon: [name], MD;  Location: [facility] ENDOSCOPY;  Service: Endoscopy;  Laterality: N/A;  PRIORITY 4   CYSTOSCOPY     ESOPHAGEAL  MANOMETRY N/A 08/03/2016   Procedure: ESOPHAGEAL MANOMETRY (EM);  Surgeon: Mauri Pole, MD;  Location: [facility] ENDOSCOPY;  Service: Endoscopy;  Laterality: N/A;   ESOPHAGOGASTRODUODENOSCOPY     ESOPHAGOGASTRODUODENOSCOPY (EGD) WITH PROPOFOL N/A 02/17/2016   with esoph dilatation   ESOPHAGOGASTRODUODENOSCOPY (EGD) WITH PROPOFOL N/A 03/10/2020   Procedure: ESOPHAGOGASTRODUODENOSCOPY (EGD) WITH PROPOFOL;  Surgeon: [name], MD;  Location[facility] ENDOSCOPY;  Service: Endoscopy;  Laterality: N/A;   TONSILLECTOMY      There were no vitals filed for this visit.   Subjective Assessment - 06/15/21 0826     Subjective "I appreciate your willingness to meet with me and help me"    Currently in Pain? No/denies                SLP Evaluation [facility] - 06/15/21 7425       SLP Visit Information   SLP Received On 06/14/21    Referring Provider (SLP) Gurney Maxin    Onset Date 04/14/2021    Medical Diagnosis Dysphagia      General Information   HPI Pt is a 75 year old male referred by his neurologist, Dr Gurney Maxin for dysphagia resulting in chronic globus sensation. Past medical history for chronic sensation includes MBSS (11/01/2016) - no dysphagia noted, CT neck (11/17/2016, subsequently multiple following) - C5-6 left paracentral endplate spur exerts the greatest mass effect on the esophagus, has been under the  care of GI (Dr Allen Norris) with barium swallow negative, in addition, pt saw ENT (Dr Richardson Landry) June 2018 and January 2019 - during which pt was diagnosed with LPR d/t pachyderma and mild erythema of the inter-arytenoid mucosa and placed on omeprazole 40 mg QD.    Behavioral/Cognition appropriate; good historian    Mobility Status ambulatory      Balance Screen   Has the patient fallen in the past 6 months No    Has the patient had a decrease in activity level because of a fear of falling?  No    Is the patient reluctant to leave their home because of a fear of falling?  No      Prior  Functional Status   Cognitive/Linguistic Baseline Within functional limits      Oral Motor/Sensory Function   Overall Oral Motor/Sensory Function Appears within functional limits for tasks assessed      Motor Speech   Overall Motor Speech Impaired    Respiration Impaired    Level of Impairment Phrase    Phonation Breathy;Hoarse;Low vocal intensity    Resonance Within functional limits    Articulation Within functional limitis    Intelligibility Intelligibility reduced    Word 75-100% accurate    Phrase 75-100% accurate    Sentence 75-100% accurate    Conversation 75-100% accurate    Motor Planning Witnin functional limits    Motor Speech Errors Not applicable    Phonation Impaired    Vocal Abuses Habitual Cough/Throat Clear    Tension Present Jaw;Neck    Volume Soft    Pitch Low                             SLP Education - 06/15/21 0835     Education Details Results of assessment: LPR and vocal quality    Person(s) Educated Patient    Methods Explanation;Verbal cues;Handout    Comprehension Verbalized understanding                  Plan - 06/15/21 0837     Clinical Impression Statement Today pt presents with no globus sensation and none noted throughout session. He provides the following description of his chronic globus sensation:    - Occurs in at least 5 of 7 days during the week    - Intermittently occurring, no observable triggers    - Doesn't always occur when consuming Pos  - in fact he stated that it "happens frequently with food or liquids"    - Described as "almost like muscle gets extremely tight" while pointing bilaterally to his inter-arytenoids   - He has not have any difficulty swallowing or episodes of choking despite "muscle tightness"    - He wakes up most mornings with the inter-arytenoids "feeling really tight"    - Every morning he has to continuously clear his throat for several hours - several throat clear noted in  evaluation but predominately occurs in the morning    - He also report vocal changes - increased hoarseness, inability to project his voice  - reports wife continually states "I can't hear you"      The following information was gathered to help conceptualize pt's vocal characteristics - Maximum phonation time for sustained "ah": 5 seconds   - Average fundamental frequency during sustained "ah": 103 Hz (2 SD below average 145 Hz + 23 for gender)   - Habitual pitch: 110 Hz   - Habitual  vocal intensity: 70dB   - Highest dynamic pitch in conversational speech: monotone    - Lowest dynamic pitch in conversational speech: monotone    - Average time patient was able to sustain /s/: 9.8 seconds   - Average time patient was able to sustain /z/: 9.52 seconds   - s/z ratio : 1.1 (suggestive of dysfunction>1.0)      At this time, the correlation between sensation of inter-arytenoid tightening and swallowing is very low. However given his reported changes in vocal quality and continued s/s of LPR, recommend pt follow up with Dr Richardson Landry for assessment of voice as well as management of LPR.  Will await Dr Reola Mosher assessment and any orders if given prior to proceeding with ST.      Consulted and Agree with Plan of Care Patient               Problem List Patient Active Problem List   Diagnosis Date Noted   Intestinal metaplasia of gastric mucosa 05/20/2021   Senile nuclear sclerosis 12/02/2020   Restless legs syndrome 12/02/2020   Osteoarthritis of hip 12/02/2020   Myopia 12/02/2020   Esophageal reflux 12/02/2020   Hearing loss 12/02/2020   Actinic keratosis 12/02/2020   Chronic obstructive lung disease ([place]) 12/02/2020   Chronic neck pain 12/02/2020   Other ill-defined and unknown causes of morbidity and mortality 12/02/2020   Neurocardiogenic pre-syncope 10/13/2020   Seizure-like activity ([place]) 07/06/2020   Dietary B12 deficiency 05/14/2020   Headache disorder 04/13/2020   Other  diseases of stomach and duodenum    Hx of transient ischemic attack (TIA) 01/28/2020   Elevated left ventricular end-diastolic pressure (LVEDP) 06/01/2019   Moderate aortic regurgitation 06/01/2019   Carotid artery plaque, left 07/09/2018   Sick sinus syndrome ([place]) 03/09/2017   Anosmia 05/05/2016   Dysphagia 02/03/2016   Tobacco use disorder, moderate, in sustained remission 03/11/2015   Lumbar disc herniation with radiculopathy 03/11/2015   Degeneration of intervertebral disc 03/10/2015   BPH with obstruction/lower urinary tract symptoms 03/10/2015   Degenerative arthritis of finger 03/10/2015   Post-traumatic stress disorder 03/10/2015   Lattice degeneration 03/10/2015   Leg varices 03/10/2015   Abdou Stocks B. Rutherford Nail M.S., CCC-SLP, [place] Pathologist Rehabilitation Services Office [phone]  Stormy Fabian 06/15/2021, 8:39 AM  [place] MAIN [facility] SERVICES [address] [place], [place], [address] Phone: [phone]   Fax:  [phone]  Name: Timothy Scott MRN: [id number] Date of Birth: [birth year]

## 2021-06-23 DIAGNOSIS — R070 Pain in throat: Secondary | ICD-10-CM | POA: Diagnosis not present

## 2021-06-23 DIAGNOSIS — R490 Dysphonia: Secondary | ICD-10-CM | POA: Diagnosis not present

## 2021-06-23 DIAGNOSIS — K219 Gastro-esophageal reflux disease without esophagitis: Secondary | ICD-10-CM | POA: Diagnosis not present

## 2021-07-07 DIAGNOSIS — M79604 Pain in right leg: Secondary | ICD-10-CM | POA: Diagnosis not present

## 2021-07-07 DIAGNOSIS — M79605 Pain in left leg: Secondary | ICD-10-CM | POA: Diagnosis not present

## 2021-07-22 DIAGNOSIS — R42 Dizziness and giddiness: Secondary | ICD-10-CM | POA: Diagnosis not present

## 2021-07-22 DIAGNOSIS — R569 Unspecified convulsions: Secondary | ICD-10-CM | POA: Diagnosis not present

## 2021-07-22 DIAGNOSIS — R0989 Other specified symptoms and signs involving the circulatory and respiratory systems: Secondary | ICD-10-CM | POA: Diagnosis not present

## 2021-07-22 DIAGNOSIS — M79605 Pain in left leg: Secondary | ICD-10-CM | POA: Diagnosis not present

## 2021-07-22 DIAGNOSIS — G608 Other hereditary and idiopathic neuropathies: Secondary | ICD-10-CM | POA: Insufficient documentation

## 2021-07-22 DIAGNOSIS — R55 Syncope and collapse: Secondary | ICD-10-CM | POA: Diagnosis not present

## 2021-07-22 DIAGNOSIS — H538 Other visual disturbances: Secondary | ICD-10-CM | POA: Diagnosis not present

## 2021-07-22 DIAGNOSIS — M79604 Pain in right leg: Secondary | ICD-10-CM | POA: Diagnosis not present

## 2021-09-20 DIAGNOSIS — R569 Unspecified convulsions: Secondary | ICD-10-CM | POA: Diagnosis not present

## 2021-09-20 DIAGNOSIS — H538 Other visual disturbances: Secondary | ICD-10-CM | POA: Diagnosis not present

## 2021-09-20 DIAGNOSIS — R55 Syncope and collapse: Secondary | ICD-10-CM | POA: Diagnosis not present

## 2021-09-20 DIAGNOSIS — R42 Dizziness and giddiness: Secondary | ICD-10-CM | POA: Diagnosis not present

## 2021-10-20 ENCOUNTER — Telehealth: Payer: Self-pay

## 2021-10-20 NOTE — Telephone Encounter (Signed)
Patient called in wanting a follow up apt so scheduled for 12/06/2021

## 2021-10-25 DIAGNOSIS — Z20822 Contact with and (suspected) exposure to covid-19: Secondary | ICD-10-CM | POA: Diagnosis not present

## 2021-10-25 DIAGNOSIS — Z03818 Encounter for observation for suspected exposure to other biological agents ruled out: Secondary | ICD-10-CM | POA: Diagnosis not present

## 2021-12-06 ENCOUNTER — Ambulatory Visit (INDEPENDENT_AMBULATORY_CARE_PROVIDER_SITE_OTHER): Payer: PPO | Admitting: Gastroenterology

## 2021-12-06 ENCOUNTER — Ambulatory Visit: Payer: PPO

## 2021-12-06 ENCOUNTER — Encounter: Payer: Self-pay | Admitting: Gastroenterology

## 2021-12-06 VITALS — BP 130/78 | HR 63 | Temp 97.6°F | Wt 168.0 lb

## 2021-12-06 DIAGNOSIS — K31A0 Gastric intestinal metaplasia, unspecified: Secondary | ICD-10-CM

## 2021-12-06 DIAGNOSIS — R197 Diarrhea, unspecified: Secondary | ICD-10-CM

## 2021-12-06 NOTE — Progress Notes (Signed)
? ? ?Primary Care Physician: Glean Hess, MD ? ?Primary Gastroenterologist:  Dr. [name] ? ?Chief Complaint  ?Patient presents with  ? Diarrhea  ?  3-4 times daily  ? Abdominal Cramping  ? ? ?HPI: Timothy Scott is a 76 y.o. male here with a history of diarrhea that he states has been worse in the last year or so.  There is no report of any unexplained weight loss except that he states that he has not been working out as much and exercising as much so he thinks he may have not been able to keep weight on.  There is no report of any rectal bleeding.  The patient states that he is having approximately 3 bowel movements a day that is more than his usual.  There is no report of any fevers chills nausea or vomiting.  The patient did have a colonoscopy 2 years ago and at that time had an adenomatous polyp.  The patient also had gastric intestinal metaplasia and was recommended to have a repeat colonoscopy in July of this year.  The patient denies that the stools wake him up from his sleep at night.  He also denies any black or bloody stools.  He does have some intermittent abdominal pain and he takes his omeprazole daily.  The patient does take Pepto-Bismol that makes the pain go away. ? ?Past Medical History:  ?Diagnosis Date  ? Benign neoplasm of colon   ? Chronic airway obstruction, not elsewhere classified   ? DDD (degenerative disc disease), cervical   ? Degeneration of cervical intervertebral disc   ? Dysphagia   ? Dysuria   ? Hyperlipidemia   ? 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 Lattice degeneration of peripheral retina   ? Leg varices 03/10/2015  ? Osteoarthrosis, unspecified whether generalized or localized, hand   ? Polyp of transverse colon   ? Posttraumatic stress disorder   ? Sinus bradycardia 04/17/2014  ? Varicose veins of bilateral lower extremities with other complications   ? Wears dentures   ? partial upper and lower  ? ? ?Current Outpatient Medications   ?Medication Sig Dispense Refill  ? buPROPion (WELLBUTRIN SR) 150 MG 12 hr tablet TAKE ONE TABLET BY MOUTH TWO TIMES A DAY (FOR POSTTRAUMATIC STRESS DISORDER AND MOOD)    ? Calcium Carbonate-Vit D-Min (CALCIUM 1200 PO) Take 1 tablet by mouth daily.     ? cyanocobalamin 1000 MCG tablet Take by mouth.    ? finasteride (PROSCAR) 5 MG tablet Take 5 mg by mouth as needed.     ? glucosamine-chondroitin 500-400 MG tablet Take 1 tablet by mouth daily.    ? Misc Natural Products (DAILY HERBS PROSTATE PO) Take 1 tablet by mouth daily.    ? Multiple Vitamins-Minerals (ZINC PO) Take by mouth daily.    ? naproxen (NAPROSYN) 500 MG tablet Take 500 mg by mouth 2 (two) times daily as needed for mild pain or moderate pain.     ? omeprazole (PRILOSEC) 40 MG capsule Take 40 mg by mouth daily.     ? risperiDONE (RISPERDAL) 0.25 MG tablet TAKE ONE-HALF TABLET BY MOUTH ONCE EVERY DAY AS NEEDED (FOR SLEEP, IRRITABILITY AND ANXIOUSNESS)    ? tamsulosin (FLOMAX) 0.4 MG CAPS capsule Take 0.4 mg by mouth as needed.     ? traZODone (DESYREL) 100 MG tablet TAKE TWO TABLETS BY MOUTH AT BEDTIME FOR SLEEP. (YOU MAY TAKE A 3RD TABLET IF NEEDED, IF YOU  WAKE UP DURING THE NIGHT)    ? nortriptyline (PAMELOR) 10 MG capsule Take by mouth.    ? ?No current facility-administered medications for this visit.  ? ? ?Allergies as of 12/06/2021 - Review Complete 12/06/2021  ?Allergen Reaction Noted  ? Prazosin  07/28/2014  ? ? ?ROS: ? ?General: Negative for anorexia, weight loss, fever, chills, fatigue, weakness. ?ENT: Negative for hoarseness, difficulty swallowing , nasal congestion. ?CV: Negative for chest pain, angina, palpitations, dyspnea on exertion, peripheral edema.  ?Respiratory: Negative for dyspnea at rest, dyspnea on exertion, cough, sputum, wheezing.  ?GI: See history of present illness. ?GU:  Negative for dysuria, hematuria, urinary incontinence, urinary frequency, nocturnal urination.  ?Endo: Negative for unusual weight change.  ?  ?Physical  Examination: ? ? BP 130/78   Pulse 63   Temp 97.6 ?F (36.4 ?C) (Oral)   Wt 168 lb (76.2 kg)   BMI 23.43 kg/m?  ? ?General: Well-nourished, well-developed in no acute distress.  ?Eyes: No icterus. Conjunctivae pink. ?Lungs: Clear to auscultation bilaterally. Non-labored. ?Heart: Regular rate and rhythm, no murmurs rubs or gallops.  ?Abdomen: Bowel sounds are normal, nontender, nondistended, no hepatosplenomegaly or masses, no abdominal bruits or hernia , no rebound or guarding.   ?Extremities: No lower extremity edema. No clubbing or deformities. ?Neuro: Alert and oriented x 3.  Grossly intact. ?Skin: Warm and dry, no jaundice.   ?Psych: Alert and cooperative, normal mood and affect. ? ?Labs:  ?  ?Imaging Studies: ?No results found. ? ?Assessment and Plan:  ? ?Timothy Scott is a 77 y.o. y/o male who comes in today with a history of more frequent bowel movements which may be related to irritable bowel syndrome and the patient is due for a upper endoscopy due to his gastric intestinal metaplasia.  The patient will contact me if he wants to proceed with a colonoscopy prior to his appointment July for his upper endoscopy.  The patient has been told to try Imodium to see if that helps his symptoms.  The patient has been explained the plan and agrees with it. ? ? ? ? ?[name], MD. [facility] Regal ? ? ? Note: This dictation was prepared with Dragon dictation along with smaller phrase technology. Any transcriptional errors that result from this process are unintentional.  ?

## 2021-12-08 ENCOUNTER — Ambulatory Visit: Payer: PPO | Admitting: Physical Therapy

## 2021-12-14 ENCOUNTER — Encounter: Payer: PPO | Admitting: Physical Therapy

## 2021-12-20 ENCOUNTER — Ambulatory Visit (INDEPENDENT_AMBULATORY_CARE_PROVIDER_SITE_OTHER): Payer: PPO

## 2021-12-20 DIAGNOSIS — Z0000 Encounter for general adult medical examination without abnormal findings: Secondary | ICD-10-CM | POA: Diagnosis not present

## 2021-12-20 NOTE — Patient Instructions (Signed)
Timothy Scott , ?Thank you for taking time to come for your Medicare Wellness Visit. I appreciate your ongoing commitment to your health goals. Please review the following plan we discussed and let me know if I can assist you in the future.  ? ?Screening recommendations/referrals: ?Colonoscopy: done 03/10/20. Repeat 03/2025 ?Recommended yearly ophthalmology/optometry visit for glaucoma screening and checkup ?Recommended yearly dental visit for hygiene and checkup ? ?Vaccinations: ?Influenza vaccine: due fall 2023 ?Pneumococcal vaccine: done 05/28/15 ?Tdap vaccine: due ?Shingles vaccine: done 08/07/18 & 10/12/18   ?Covid-19: done 11/04/19, 12/05/19, 07/14/20, 12/29/20 & 08/28/21 ? ?Advanced directives: Please bring a copy of your health care power of attorney and living will to the office at your convenience.  ? ?Conditions/risks identified: Keep up the great work! ? ? ?Next appointment: Follow up in one year for your annual wellness visit.  ? ?Preventive Care 71 Years and Older, Male ?Preventive care refers to lifestyle choices and visits with your health care provider that can promote health and wellness. ?What does preventive care include? ?A yearly physical exam. This is also called an annual well check. ?Dental exams once or twice a year. ?Routine eye exams. Ask your health care provider how often you should have your eyes checked. ?Personal lifestyle choices, including: ?Daily care of your teeth and gums. ?Regular physical activity. ?Eating a healthy diet. ?Avoiding tobacco and drug use. ?Limiting alcohol use. ?Practicing safe sex. ?Taking low doses of aspirin every day. ?Taking vitamin and mineral supplements as recommended by your health care provider. ?What happens during an annual well check? ?The services and screenings done by your health care provider during your annual well check will depend on your age, overall health, lifestyle risk factors, and family history of disease. ?Counseling  ?Your health care provider may  ask you questions about your: ?Alcohol use. ?Tobacco use. ?Drug use. ?Emotional well-being. ?Home and relationship well-being. ?Sexual activity. ?Eating habits. ?History of falls. ?Memory and ability to understand (cognition). ?Work and work Statistician. ?Screening  ?You may have the following tests or measurements: ?Height, weight, and BMI. ?Blood pressure. ?Lipid and cholesterol levels. These may be checked every 5 years, or more frequently if you are over 98 years old. ?Skin check. ?Lung cancer screening. You may have this screening every year starting at age 75 if you have a 30-pack-year history of smoking and currently smoke or have quit within the past 15 years. ?Fecal occult blood test (FOBT) of the stool. You may have this test every year starting at age 59. ?Flexible sigmoidoscopy or colonoscopy. You may have a sigmoidoscopy every 5 years or a colonoscopy every 10 years starting at age 44. ?Prostate cancer screening. Recommendations will vary depending on your family history and other risks. ?Hepatitis C blood test. ?Hepatitis B blood test. ?Sexually transmitted disease (STD) testing. ?Diabetes screening. This is done by checking your blood sugar (glucose) after you have not eaten for a while (fasting). You may have this done every 1-3 years. ?Abdominal aortic aneurysm (AAA) screening. You may need this if you are a current or former smoker. ?Osteoporosis. You may be screened starting at age 46 if you are at high risk. ?Talk with your health care provider about your test results, treatment options, and if necessary, the need for more tests. ?Vaccines  ?Your health care provider may recommend certain vaccines, such as: ?Influenza vaccine. This is recommended every year. ?Tetanus, diphtheria, and acellular pertussis (Tdap, Td) vaccine. You may need a Td booster every 10 years. ?Zoster vaccine. You may  need this after age 63. ?Pneumococcal 13-valent conjugate (PCV13) vaccine. One dose is recommended after age  29. ?Pneumococcal polysaccharide (PPSV23) vaccine. One dose is recommended after age 67. ?Talk to your health care provider about which screenings and vaccines you need and how often you need them. ?This information is not intended to replace advice given to you by your health care provider. Make sure you discuss any questions you have with your health care provider. ?Document Released: 09/18/2015 Document Revised: 05/11/2016 Document Reviewed: 06/23/2015 ?Elsevier Interactive Patient Education ? 2017 [place]. ? ?Fall Prevention in the Home ?Falls can cause injuries. They can happen to people of all ages. There are many things you can do to make your home safe and to help prevent falls. ?What can I do on the outside of my home? ?Regularly fix the edges of walkways and driveways and fix any cracks. ?Remove anything that might make you trip as you walk through a door, such as a raised step or threshold. ?Trim any bushes or trees on the path to your home. ?Use bright outdoor lighting. ?Clear any walking paths of anything that might make someone trip, such as rocks or tools. ?Regularly check to see if handrails are loose or broken. Make sure that both sides of any steps have handrails. ?Any raised decks and porches should have guardrails on the edges. ?Have any leaves, snow, or ice cleared regularly. ?Use sand or salt on walking paths during winter. ?Clean up any spills in your garage right away. This includes oil or grease spills. ?What can I do in the bathroom? ?Use night lights. ?Install grab bars by the toilet and in the tub and shower. Do not use towel bars as grab bars. ?Use non-skid mats or decals in the tub or shower. ?If you need to sit down in the shower, use a plastic, non-slip stool. ?Keep the floor dry. Clean up any water that spills on the floor as soon as it happens. ?Remove soap buildup in the tub or shower regularly. ?Attach bath mats securely with double-sided non-slip rug tape. ?Do not have throw  rugs and other things on the floor that can make you trip. ?What can I do in the bedroom? ?Use night lights. ?Make sure that you have a light by your bed that is easy to reach. ?Do not use any sheets or blankets that are too big for your bed. They should not hang down onto the floor. ?Have a firm chair that has side arms. You can use this for support while you get dressed. ?Do not have throw rugs and other things on the floor that can make you trip. ?What can I do in the kitchen? ?Clean up any spills right away. ?Avoid walking on wet floors. ?Keep items that you use a lot in easy-to-reach places. ?If you need to reach something above you, use a strong step stool that has a grab bar. ?Keep electrical cords out of the way. ?Do not use floor polish or wax that makes floors slippery. If you must use wax, use non-skid floor wax. ?Do not have throw rugs and other things on the floor that can make you trip. ?What can I do with my stairs? ?Do not leave any items on the stairs. ?Make sure that there are handrails on both sides of the stairs and use them. Fix handrails that are broken or loose. Make sure that handrails are as long as the stairways. ?Check any carpeting to make sure that it is firmly attached  to the stairs. Fix any carpet that is loose or worn. ?Avoid having throw rugs at the top or bottom of the stairs. If you do have throw rugs, attach them to the floor with carpet tape. ?Make sure that you have a light switch at the top of the stairs and the bottom of the stairs. If you do not have them, ask someone to add them for you. ?What else can I do to help prevent falls? ?Wear shoes that: ?Do not have high heels. ?Have rubber bottoms. ?Are comfortable and fit you well. ?Are closed at the toe. Do not wear sandals. ?If you use a stepladder: ?Make sure that it is fully opened. Do not climb a closed stepladder. ?Make sure that both sides of the stepladder are locked into place. ?Ask someone to hold it for you, if  possible. ?Clearly mark and make sure that you can see: ?Any grab bars or handrails. ?First and last steps. ?Where the edge of each step is. ?Use tools that help you move around (mobility aids) if they are

## 2021-12-20 NOTE — Progress Notes (Signed)
? ?Subjective:  ? Timothy Scott is a 76 y.o. male who presents for Medicare Annual/Subsequent preventive examination. ? ?Virtual Visit via Telephone Note ? ?I connected with  [name] on 12/20/21 at 10:30 AM EDT by telephone and verified that I am speaking with the correct person using two identifiers. ? ?Location: ?Patient: home ?Provider: [facility] ?Persons participating in the virtual visit: patient/Nurse Health Advisor ?  ?I discussed the limitations, risks, security and privacy concerns of performing an evaluation and management service by telephone and the availability of in person appointments. The patient expressed understanding and agreed to proceed. ? ?Interactive audio and video telecommunications were attempted between this nurse and patient, however failed, due to patient having technical difficulties OR patient did not have access to video capability.  We continued and completed visit with audio only. ? ?Some vital signs may be absent or patient reported.  ? ?Clemetine Marker, LPN ? ? ?Review of Systems    ? ?Cardiac Risk Factors include: advanced age (>44mn, >>65women);dyslipidemia;male gender ? ?   ?Objective:  ?  ?Today's Vitals  ? 12/20/21 1031  ?PainSc: 4   ? ?There is no height or weight on file to calculate BMI. ? ? ?  12/20/2021  ? 10:38 AM 12/02/2020  ? 10:54 AM 03/10/2020  ? 10:06 AM 05/31/2019  ?  5:02 PM 05/31/2019  ?  2:06 PM 04/15/2019  ? 10:21 AM 03/21/2018  ?  1:37 PM  ?Advanced Directives  ?Does Patient Have a Medical Advance Directive? Yes Yes Yes  Yes Yes Yes  ?Type of [place]of [place] will [place] will [place] LakeLiving will [place]Living will Living will [place]Living will HMount HollyLiving will  ?Does patient want to make changes to medical advance directive?     No - Patient declined    ?Copy of [place]? No - copy  requested No - copy requested Yes - validated most recent copy scanned in chart (See row information) No - copy requested  No - copy requested No - copy requested  ? ? ?Current Medications (verified) ?Outpatient Encounter Medications as of 12/20/2021  ?Medication Sig  ? buPROPion (WELLBUTRIN SR) 150 MG 12 hr tablet TAKE ONE TABLET BY MOUTH TWO TIMES A DAY (FOR POSTTRAUMATIC STRESS DISORDER AND MOOD)  ? Calcium Carbonate-Vit D-Min (CALCIUM 1200 PO) Take 1 tablet by mouth daily.   ? cyanocobalamin 1000 MCG tablet Take by mouth.  ? finasteride (PROSCAR) 5 MG tablet Take 5 mg by mouth as needed.   ? glucosamine-chondroitin 500-400 MG tablet Take 1 tablet by mouth daily.  ? Misc Natural Products (DAILY HERBS PROSTATE PO) Take 1 tablet by mouth daily.  ? Multiple Vitamins-Minerals (ZINC PO) Take by mouth daily.  ? naproxen (NAPROSYN) 500 MG tablet Take 500 mg by mouth 2 (two) times daily as needed for mild pain or moderate pain.   ? nortriptyline (PAMELOR) 10 MG capsule Take by mouth.  ? omeprazole (PRILOSEC) 40 MG capsule Take 40 mg by mouth daily.   ? risperiDONE (RISPERDAL) 0.25 MG tablet TAKE ONE-HALF TABLET BY MOUTH ONCE EVERY DAY AS NEEDED (FOR SLEEP, IRRITABILITY AND ANXIOUSNESS)  ? tamsulosin (FLOMAX) 0.4 MG CAPS capsule Take 0.4 mg by mouth as needed.   ? traZODone (DESYREL) 100 MG tablet TAKE TWO TABLETS BY MOUTH AT BEDTIME FOR SLEEP. (YOU MAY TAKE A 3RD TABLET IF NEEDED, IF YOU WAKE UP  DURING THE NIGHT)  ? ?No facility-administered encounter medications on file as of 12/20/2021.  ? ? ?Allergies (verified) ?Prazosin  ? ?History: ?Past Medical History:  ?Diagnosis Date  ? Benign neoplasm of colon   ? Chronic airway obstruction, not elsewhere classified   ? DDD (degenerative disc disease), cervical   ? Degeneration of cervical intervertebral disc   ? Dysphagia   ? Dysuria   ? Hyperlipidemia   ? 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 Lattice degeneration of peripheral retina   ? Leg varices  03/10/2015  ? Osteoarthrosis, unspecified whether generalized or localized, hand   ? Polyp of transverse colon   ? Posttraumatic stress disorder   ? Sinus bradycardia 04/17/2014  ? Varicose veins of bilateral lower extremities with other complications   ? Wears dentures   ? partial upper and lower  ? ?Past Surgical History:  ?Procedure Laterality Date  ? CATARACT EXTRACTION Right 2015  ? CATARACT EXTRACTION W/PHACO Left 10/21/2015  ? Procedure: CATARACT EXTRACTION PHACO AND INTRAOCULAR LENS PLACEMENT (IOC);  Surgeon: Leandrew Koyanagi, MD;  Location: [place];  Service: Ophthalmology;  Laterality: Left;  ? CATARACT EXTRACTION, BILATERAL  2016  ? COLONOSCOPY  2014  ? benign polyps  ? COLONOSCOPY WITH PROPOFOL N/A 03/10/2020  ? Procedure: COLONOSCOPY WITH PROPOFOL;  Surgeon: [name], MD;  Location: [facility] ENDOSCOPY;  Service: Endoscopy;  Laterality: N/A;  PRIORITY 4  ? CYSTOSCOPY    ? ESOPHAGEAL MANOMETRY N/A 08/03/2016  ? Procedure: ESOPHAGEAL MANOMETRY (EM);  Surgeon: Mauri Pole, MD;  Location: [facility] ENDOSCOPY;  Service: Endoscopy;  Laterality: N/A;  ? ESOPHAGOGASTRODUODENOSCOPY    ? ESOPHAGOGASTRODUODENOSCOPY (EGD) WITH PROPOFOL N/A 02/17/2016  ? with esoph dilatation  ? ESOPHAGOGASTRODUODENOSCOPY (EGD) WITH PROPOFOL N/A 03/10/2020  ? Procedure: ESOPHAGOGASTRODUODENOSCOPY (EGD) WITH PROPOFOL;  Surgeon: [name], MD;  Location: [facility] ENDOSCOPY;  Service: Endoscopy;  Laterality: N/A;  ? TONSILLECTOMY    ? ?Family History  ?Problem Relation Age of Onset  ? Hypertension Mother   ? Cancer Mother   ?     breast  ? Aneurysm Mother   ? Healthy Sister   ? Prostate cancer Neg Hx   ? Kidney cancer Neg Hx   ? Bladder Cancer Neg Hx   ? ?Social History  ? ?Socioeconomic History  ? Marital status: Divorced  ?  Spouse name: Not on file  ? Number of children: 1  ? Years of education: some college  ? Highest education level: 12th grade  ?Occupational History  ? Occupation: Retired  ?Tobacco Use  ? Smoking status:  Former  ?  Packs/day: 0.50  ?  Years: 20.00  ?  Pack years: 10.00  ?  Types: Cigarettes, Pipe, Cigars  ?  Quit date: 04/05/1986  ?  Years since quitting: 35.7  ? Smokeless tobacco: Never  ? Tobacco comments:  ?  smoking cessation materials not required  ?Vaping Use  ? Vaping Use: Never used  ?Substance and Sexual Activity  ? Alcohol use: Yes  ?  Alcohol/week: 6.0 standard drinks  ?  Types: 6 Standard drinks or equivalent per week  ?  Comment: socially  ? Drug use: No  ? Sexual activity: Not Currently  ?Other Topics Concern  ? Not on file  ?Social History Narrative  ? Pt lives alone  ? ?Social Determinants of Health  ? ?Financial Resource Strain: Low Risk   ? Difficulty of Paying Living Expenses: Not hard at all  ?Food Insecurity: No Food  Insecurity  ? Worried About Charity fundraiser in the Last Year: Never true  ? Ran Out of Food in the Last Year: Never true  ?Transportation Needs: No Transportation Needs  ? Lack of Transportation (Medical): No  ? Lack of Transportation (Non-Medical): No  ?Physical Activity: Sufficiently Active  ? Days of Exercise per Week: 5 days  ? Minutes of Exercise per Session: 30 min  ?Stress: Stress Concern Present  ? Feeling of Stress : Very much  ?Social Connections: Socially Isolated  ? Frequency of Communication with Friends and Family: More than three times a week  ? Frequency of Social Gatherings with Friends and Family: Three times a week  ? Attends Religious Services: Never  ? Active Member of Clubs or Organizations: No  ? Attends Archivist Meetings: Never  ? Marital Status: Divorced  ? ? ?Tobacco Counseling ?Counseling given: Not Answered ?Tobacco comments: smoking cessation materials not required ? ? ?Clinical Intake: ? ?Pre-visit preparation completed: Yes ? ?Pain : 0-10 ?Pain Score: 4  ?Pain Type: Chronic pain ?Pain Location: Hip ?Pain Orientation: Left ?Pain Descriptors / Indicators: Aching, Sore ?Pain Onset: More than a month ago ?Pain Frequency: Constant ? ?   ? ?Nutritional Risks: None ?Diabetes: No ? ?How often do you need to have someone help you when you read instructions, pamphlets, or other written materials from your doctor or pharmacy?: 1 - Never ? ? ? ?Interp

## 2021-12-21 ENCOUNTER — Encounter: Payer: PPO | Admitting: Physical Therapy

## 2021-12-23 ENCOUNTER — Encounter: Payer: PPO | Admitting: Physical Therapy

## 2021-12-27 ENCOUNTER — Encounter: Payer: PPO | Admitting: Physical Therapy

## 2021-12-29 ENCOUNTER — Encounter: Payer: PPO | Admitting: Physical Therapy

## 2022-01-05 ENCOUNTER — Encounter: Payer: PPO | Admitting: Physical Therapy

## 2022-01-10 ENCOUNTER — Encounter: Payer: PPO | Admitting: Physical Therapy

## 2022-01-13 ENCOUNTER — Encounter: Payer: PPO | Admitting: Physical Therapy

## 2022-01-18 ENCOUNTER — Encounter: Payer: PPO | Admitting: Physical Therapy

## 2022-01-20 ENCOUNTER — Encounter: Payer: PPO | Admitting: Physical Therapy

## 2022-01-24 ENCOUNTER — Encounter: Payer: PPO | Admitting: Physical Therapy

## 2022-01-27 ENCOUNTER — Encounter: Payer: PPO | Admitting: Physical Therapy

## 2022-02-01 ENCOUNTER — Encounter: Payer: PPO | Admitting: Physical Therapy

## 2022-02-03 ENCOUNTER — Encounter: Payer: PPO | Admitting: Physical Therapy

## 2022-02-14 DIAGNOSIS — D3131 Benign neoplasm of right choroid: Secondary | ICD-10-CM | POA: Diagnosis not present

## 2022-02-23 DIAGNOSIS — R569 Unspecified convulsions: Secondary | ICD-10-CM | POA: Diagnosis not present

## 2022-02-23 DIAGNOSIS — M79605 Pain in left leg: Secondary | ICD-10-CM | POA: Diagnosis not present

## 2022-02-23 DIAGNOSIS — R42 Dizziness and giddiness: Secondary | ICD-10-CM | POA: Diagnosis not present

## 2022-02-23 DIAGNOSIS — M79604 Pain in right leg: Secondary | ICD-10-CM | POA: Diagnosis not present

## 2022-02-23 DIAGNOSIS — H538 Other visual disturbances: Secondary | ICD-10-CM | POA: Diagnosis not present

## 2022-02-23 DIAGNOSIS — R0989 Other specified symptoms and signs involving the circulatory and respiratory systems: Secondary | ICD-10-CM | POA: Diagnosis not present

## 2022-03-02 ENCOUNTER — Other Ambulatory Visit: Payer: Self-pay

## 2022-03-02 DIAGNOSIS — K31A0 Gastric intestinal metaplasia, unspecified: Secondary | ICD-10-CM

## 2022-03-02 DIAGNOSIS — R194 Change in bowel habit: Secondary | ICD-10-CM

## 2022-03-02 MED ORDER — NA SULFATE-K SULFATE-MG SULF 17.5-3.13-1.6 GM/177ML PO SOLN: 1.0000 | Freq: Once | ORAL | 0 refills | Status: AC

## 2022-03-02 NOTE — Progress Notes (Signed)
Patient contacted office to schedule colonoscopy w/EGD.  Office noted reviewed from last office visit with Dr. Allen Norris 12/06/21.  Dr. Allen Norris noted that patient would call back to schedule EGD and Colonoscopy in July.  Patient has been scheduled at [facility] on 04/26/22.

## 2022-03-03 ENCOUNTER — Telehealth: Payer: Self-pay

## 2022-03-03 NOTE — Telephone Encounter (Signed)
Patient called my phone asking for Ginger. Asked patient if he was calling for Dr. Allen Norris  nurse he said yes. I said that Threasa Beards was Dr. Allen Norris nurse now. I asked if he wanted to talk to her and he said yes so transferred call to [facility]

## 2022-03-03 NOTE — Telephone Encounter (Signed)
Pt had questions regarding recently scheduled procedures, nothing further needed

## 2022-03-16 ENCOUNTER — Encounter: Payer: Self-pay | Admitting: Speech Pathology

## 2022-03-16 NOTE — Therapy (Addendum)
SLP placed telephone call to patient regarding referral to ST for globus sensation. Records reviewed including most recent ENT report (06/23/21 "hypopharynx, larynx and tongue base are free of lesions. The vocal cords are clear and mobile without lesions. There is mild pachydermia of the posterior glottis without erythema.", OP SLP evaluation for globus on 06/14/21, neurology and GI records. Pt has been educated on LPR, denies difficulties with swallowing, breathing. Pt does not have voice complaints but does complain of "tightness" in his throat and asks for "general stretches" for the throat. SLP explained that without evidence of laryngeal tension on his laryngoscopy, there are no stretches that are recommended. If pt wishes to pursue further workup, MD could consider referral to a voice specialist for stroboscopy, such as [facility] in [place] or [facility]. Pt verbalized understanding and in agreement with canceling his ST evaluation on 7/17.  Deneise Lever, [place], Actor [phone]

## 2022-03-21 ENCOUNTER — Ambulatory Visit: Payer: PPO | Admitting: Speech Pathology

## 2022-03-24 ENCOUNTER — Ambulatory Visit: Payer: PPO | Admitting: Speech Pathology

## 2022-03-25 DIAGNOSIS — M25552 Pain in left hip: Secondary | ICD-10-CM | POA: Diagnosis not present

## 2022-03-25 DIAGNOSIS — M1612 Unilateral primary osteoarthritis, left hip: Secondary | ICD-10-CM | POA: Diagnosis not present

## 2022-03-28 ENCOUNTER — Telehealth: Payer: Self-pay

## 2022-03-28 ENCOUNTER — Encounter: Payer: PPO | Admitting: Speech Pathology

## 2022-03-28 NOTE — Telephone Encounter (Signed)
Patient left a voicemail on my phone last week and states he would like to see Dr. Allen Norris before his schedule colonoscopy and EGD.

## 2022-03-31 ENCOUNTER — Telehealth: Payer: Self-pay

## 2022-03-31 ENCOUNTER — Encounter: Payer: PPO | Admitting: Speech Pathology

## 2022-03-31 NOTE — Telephone Encounter (Signed)
Patient has requested to schedule an office visit with Dr. Allen Norris prior to having his Colonoscopy w/EGD currently scheduled for 04/26/22.  Discussed with Ginger.  Per our discussion-patient has been informed that Dr. Allen Norris is on vacation out of the country but when he returns next week we will ask if he could see him on the 15th or 17th to do an office visit or video visit since Dr. Allen Norris is on call these days.  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  Thanks,  [place], [place]

## 2022-04-04 ENCOUNTER — Encounter: Payer: PPO | Admitting: Speech Pathology

## 2022-04-07 ENCOUNTER — Encounter: Payer: PPO | Admitting: Speech Pathology

## 2022-04-11 ENCOUNTER — Encounter: Payer: PPO | Admitting: Speech Pathology

## 2022-04-14 ENCOUNTER — Encounter: Payer: PPO | Admitting: Speech Pathology

## 2022-04-26 ENCOUNTER — Ambulatory Visit: Payer: PPO | Admitting: Anesthesiology

## 2022-04-26 ENCOUNTER — Encounter: Payer: Self-pay | Admitting: Gastroenterology

## 2022-04-26 ENCOUNTER — Encounter
Admission: RE | Disposition: A | Discharge status: Home / Self Care | Payer: Self-pay | Source: Ambulatory Visit | Attending: Gastroenterology

## 2022-04-26 ENCOUNTER — Ambulatory Visit
Admission: RE | Admit: 2022-04-26 | Discharge: 2022-04-26 | Disposition: A | Discharge status: Home / Self Care | Payer: PPO | Source: Ambulatory Visit | Attending: Gastroenterology | Admitting: Gastroenterology

## 2022-04-26 ENCOUNTER — Other Ambulatory Visit: Payer: Self-pay

## 2022-04-26 DIAGNOSIS — Z87891 Personal history of nicotine dependence: Secondary | ICD-10-CM | POA: Insufficient documentation

## 2022-04-26 DIAGNOSIS — J449 Chronic obstructive pulmonary disease, unspecified: Secondary | ICD-10-CM | POA: Insufficient documentation

## 2022-04-26 DIAGNOSIS — K5730 Diverticulosis of large intestine without perforation or abscess without bleeding: Secondary | ICD-10-CM | POA: Insufficient documentation

## 2022-04-26 DIAGNOSIS — K529 Noninfective gastroenteritis and colitis, unspecified: Secondary | ICD-10-CM | POA: Diagnosis not present

## 2022-04-26 DIAGNOSIS — K640 First degree hemorrhoids: Secondary | ICD-10-CM | POA: Diagnosis not present

## 2022-04-26 DIAGNOSIS — K2970 Gastritis, unspecified, without bleeding: Secondary | ICD-10-CM | POA: Diagnosis not present

## 2022-04-26 DIAGNOSIS — K2950 Unspecified chronic gastritis without bleeding: Secondary | ICD-10-CM | POA: Insufficient documentation

## 2022-04-26 DIAGNOSIS — F419 Anxiety disorder, unspecified: Secondary | ICD-10-CM | POA: Diagnosis not present

## 2022-04-26 DIAGNOSIS — K31A0 Gastric intestinal metaplasia, unspecified: Secondary | ICD-10-CM | POA: Diagnosis not present

## 2022-04-26 DIAGNOSIS — Z1211 Encounter for screening for malignant neoplasm of colon: Secondary | ICD-10-CM | POA: Diagnosis not present

## 2022-04-26 DIAGNOSIS — I739 Peripheral vascular disease, unspecified: Secondary | ICD-10-CM | POA: Diagnosis not present

## 2022-04-26 DIAGNOSIS — F4310 Post-traumatic stress disorder, unspecified: Secondary | ICD-10-CM | POA: Insufficient documentation

## 2022-04-26 DIAGNOSIS — M1990 Unspecified osteoarthritis, unspecified site: Secondary | ICD-10-CM | POA: Insufficient documentation

## 2022-04-26 DIAGNOSIS — R194 Change in bowel habit: Secondary | ICD-10-CM

## 2022-04-26 DIAGNOSIS — K3189 Other diseases of stomach and duodenum: Secondary | ICD-10-CM | POA: Insufficient documentation

## 2022-04-26 HISTORY — PX: COLONOSCOPY WITH PROPOFOL: SHX5780

## 2022-04-26 HISTORY — PX: ESOPHAGOGASTRODUODENOSCOPY: SHX5428

## 2022-04-26 SURGERY — COLONOSCOPY WITH PROPOFOL
Anesthesia: General

## 2022-04-26 MED ORDER — PROPOFOL 10 MG/ML IV BOLUS
INTRAVENOUS | Status: DC | PRN
  Administered 2022-04-26 (×3): 30 mg via INTRAVENOUS
  Administered 2022-04-26: 40 mg via INTRAVENOUS
  Administered 2022-04-26: 30 mg via INTRAVENOUS
  Administered 2022-04-26: 40 mg via INTRAVENOUS
  Administered 2022-04-26: 100 mg via INTRAVENOUS

## 2022-04-26 MED ORDER — SODIUM CHLORIDE 0.9 % IV SOLN: INTRAVENOUS | Status: DC

## 2022-04-26 MED ORDER — LIDOCAINE HCL (CARDIAC) PF 100 MG/5ML IV SOSY
PREFILLED_SYRINGE | INTRAVENOUS | Status: DC | PRN
  Administered 2022-04-26: 100 mg via INTRAVENOUS

## 2022-04-26 NOTE — Op Note (Addendum)
[facility] Gastroenterology Patient Name: Timothy Scott Procedure Date: 04/26/2022 8:43 AM MRN: [id number] Account #: [id number] Date of Birth: 01/14/1946 Admit Type: Outpatient Age: 76 Room: [facility] ENDO ROOM 4 Gender: Male Note Status: Finalized Instrument Name: Jasper Riling [id number] Procedure:             Colonoscopy Indications:           Chronic diarrhea Providers:             [name] MD, MD Referring MD:          No Local Md, MD (Referring MD) Medicines:             Propofol per Anesthesia Complications:         No immediate complications. Procedure:             Pre-Anesthesia Assessment:                        - Prior to the procedure, a History and Physical was                         performed, and patient medications and allergies were                         reviewed. The patient's tolerance of previous                         anesthesia was also reviewed. The risks and benefits                         of the procedure and the sedation options and risks                         were discussed with the patient. All questions were                         answered, and informed consent was obtained. Prior                         Anticoagulants: The patient has taken no previous                         anticoagulant or antiplatelet agents. ASA Grade                         Assessment: II - A patient with mild systemic disease.                         After reviewing the risks and benefits, the patient                         was deemed in satisfactory condition to undergo the                         procedure.                        After obtaining informed consent, the colonoscope was  passed under direct vision. Throughout the procedure,                         the patient's blood pressure, pulse, and oxygen                         saturations were monitored continuously. The                         Colonoscope was introduced  through the anus and                         advanced to the the cecum, identified by appendiceal                         orifice and ileocecal valve. The colonoscopy was                         performed without difficulty. The patient tolerated                         the procedure well. The quality of the bowel                         preparation was excellent. Findings:      The perianal and digital rectal examinations were normal.      A few small-mouthed diverticula were found in the sigmoid colon.      Non-bleeding internal hemorrhoids were found during retroflexion. The       hemorrhoids were Grade I (internal hemorrhoids that do not prolapse).      Random biopsies were obtained with cold forceps for histology randomly.      A 2 mm polyp was found in the transverse colon. The polyp was sessile.       The polyp was removed with a cold biopsy forceps. Resection and       retrieval were complete. Impression:            - Diverticulosis in the sigmoid colon.                        - Non-bleeding internal hemorrhoids.                        - One 2 mm polyp in the transverse colon, removed with                         a cold biopsy forceps. Resected and retrieved.                        - Random biopsies were obtained. Recommendation:        - Discharge patient to home.                        - Resume previous diet.                        - Continue present medications.                        - Await pathology  results.                        - Repeat colonoscopy is not recommended for                         surveillance. Procedure Code(s):     --- Professional ---                        [phone], Colonoscopy, flexible; with biopsy, single or                         multiple Diagnosis Code(s):     --- Professional ---                        K52.9, Noninfective gastroenteritis and colitis,                         unspecified CPT copyright 2019 American Medical Association. All rights  reserved. The codes documented in this report are preliminary and upon coder review may  be revised to meet current compliance requirements. [name] MD, MD 04/26/2022 9:14:49 AM This report has been signed electronically. Number of Addenda: 0 Note Initiated On: 04/26/2022 8:43 AM Scope Withdrawal Time: 0 hours 6 minutes 40 seconds  Total Procedure Duration: 0 hours 8 minutes 47 seconds  Estimated Blood Loss:  Estimated blood loss: none. Estimated blood loss: none.      [facility]

## 2022-04-26 NOTE — Transfer of Care (Signed)
Immediate Anesthesia Transfer of Care Note  Patient: Timothy Scott  Procedure(s) Performed: COLONOSCOPY WITH PROPOFOL ESOPHAGOGASTRODUODENOSCOPY (EGD)  Patient Location: Endoscopy Unit  Anesthesia Type:General  Level of Consciousness: drowsy  Airway & Oxygen Therapy: Patient Spontanous Breathing and Patient connected to nasal cannula oxygen  Post-op Assessment: Report given to RN and Post -op Vital signs reviewed and stable  Post vital signs: Reviewed and stable  Last Vitals:  Vitals Value Taken Time  BP 101/73 04/26/22 0911  Temp    Pulse 61 04/26/22 0912  Resp 14 04/26/22 0912  SpO2 94 % 04/26/22 0912  Vitals shown include unvalidated device data.  Last Pain:  Vitals:   04/26/22 0801  TempSrc: Temporal  PainSc: 0-No pain         Complications: No notable events documented.

## 2022-04-26 NOTE — Anesthesia Preprocedure Evaluation (Addendum)
Anesthesia Evaluation  Patient identified by MRN, date of birth, ID band Patient awake    Reviewed: Allergy & Precautions, NPO status , Patient's Chart, lab work & pertinent test results  History of Anesthesia Complications Negative for: history of anesthetic complications  Airway Mallampati: II   Neck ROM: Full    Dental  (+) Missing   Pulmonary COPD, former smoker (quit 1987),    Pulmonary exam normal breath sounds clear to auscultation       Cardiovascular + Peripheral Vascular Disease  Normal cardiovascular exam+ dysrhythmias (SSS)  Rhythm:Regular Rate:Normal  Stress echo 03/23/21:  Normal Stress Echocardiogram  NORMAL RIGHT VENTRICULAR SYSTOLIC FUNCTION  TRIVIAL REGURGITATION NOTED  NO VALVULAR STENOSIS NOTED    Neuro/Psych PSYCHIATRIC DISORDERS (PTSD) Anxiety Vertigo  TIA   GI/Hepatic negative GI ROS,   Endo/Other  negative endocrine ROS  Renal/GU negative Renal ROS     Musculoskeletal  (+) Arthritis ,   Abdominal   Peds  Hematology negative hematology ROS (+)   Anesthesia Other Findings Cardiology note 02/11/21:  76 y.o. male with  1. Sick sinus syndrome (CMS-HCC)  2. Syncope, unspecified syncope type  3. Bradycardia  4. SOB (shortness of breath) on exertion  5. Tobacco use  6. Equivalent angina (CMS-HCC)   76 year old gentleman with history of symptomatic bradycardia consistent with sick sinus syndrome, with recent history of increased episodes of weakness, dizziness and presyncope. Patient also reports episodes of neck/throat discomfort which appear to be unrelated to exertion.  Plan   1. Continue current medications 2. 72-hour Holter monitor 3. Carotid ultrasound 4. Stress echocardiogram to rule out myocardial ischemia and to assess for chronotropic incompetence 5. Return to clinic after Holter monitor, carotid ultrasound and stress echocardiogram   Reproductive/Obstetrics                             Anesthesia Physical Anesthesia Plan  ASA: 3  Anesthesia Plan: General   Post-op Pain Management:    Induction: Intravenous  PONV Risk Score and Plan: 2 and Propofol infusion, TIVA and Treatment may vary due to age or medical condition  Airway Management Planned: Natural Airway  Additional Equipment:   Intra-op Plan:   Post-operative Plan:   Informed Consent: I have reviewed the patients History and Physical, chart, labs and discussed the procedure including the risks, benefits and alternatives for the proposed anesthesia with the patient or authorized representative who has indicated his/her understanding and acceptance.       Plan Discussed with: CRNA  Anesthesia Plan Comments: (LMA/GETA backup discussed.  Patient consented for risks of anesthesia including but not limited to:  - adverse reactions to medications - damage to eyes, teeth, lips or other oral mucosa - nerve damage due to positioning  - sore throat or hoarseness - damage to heart, brain, nerves, lungs, other parts of body or loss of life  Informed patient about role of CRNA in peri- and intra-operative care.  Patient voiced understanding.)        Anesthesia Quick Evaluation

## 2022-04-26 NOTE — Op Note (Signed)
**Note Timothy-Identified via Obfuscation** [facility] Gastroenterology Patient Name: Timothy Scott Procedure Date: 04/26/2022 8:44 AM MRN: [id number] Account #: [id number] Date of Birth: Jul 08, 1946 Admit Type: Outpatient Age: 76 Room: [facility] ENDO ROOM 4 Gender: Male Note Status: Finalized Instrument Name: Upper Endoscope [id number] Procedure:             Upper GI endoscopy Indications:           Intestinal metaplasia Providers:             [name] MD, MD Referring MD:          No Local Md, MD (Referring MD) Medicines:             Propofol per Anesthesia Complications:         No immediate complications. Procedure:             Pre-Anesthesia Assessment:                        - Prior to the procedure, a History and Physical was                         performed, and patient medications and allergies were                         reviewed. The patient's tolerance of previous                         anesthesia was also reviewed. The risks and benefits                         of the procedure and the sedation options and risks                         were discussed with the patient. All questions were                         answered, and informed consent was obtained. Prior                         Anticoagulants: The patient has taken no previous                         anticoagulant or antiplatelet agents. ASA Grade                         Assessment: II - A patient with mild systemic disease.                         After reviewing the risks and benefits, the patient                         was deemed in satisfactory condition to undergo the                         procedure.                        After obtaining informed consent, the endoscope was  passed under direct vision. Throughout the procedure,                         the patient's blood pressure, pulse, and oxygen                         saturations were monitored continuously. The Endoscope                         was  introduced through the mouth, and advanced to the                         second part of duodenum. The upper GI endoscopy was                         accomplished without difficulty. The patient tolerated                         the procedure well. Findings:      The examined esophagus was normal.      The entire examined stomach was normal. Several biopsies were obtained       in the gastric body, at the incisura, on the anterior wall of the       gastric antrum, on the greater curvature of the gastric antrum and on       the lesser curvature of the gastric antrum with cold forceps for       histology.      The examined duodenum was normal. Impression:            - Normal esophagus.                        - Normal stomach.                        - Normal examined duodenum.                        - Several biopsies were obtained in the gastric body,                         at the incisura, on the anterior wall of the gastric                         antrum, on the greater curvature of the gastric antrum                         and on the lesser curvature of the gastric antrum. Recommendation:        - Discharge patient to home.                        - Resume previous diet.                        - Continue present medications.                        - Await pathology results.                        -  Repeat upper endoscopy in 2 years for surveillance. Procedure Code(s):     --- Professional ---                        [phone], Esophagogastroduodenoscopy, flexible,                         transoral; with biopsy, single or multiple Diagnosis Code(s):     --- Professional ---                        K31.89, Other diseases of stomach and duodenum CPT copyright 2019 American Medical Association. All rights reserved. The codes documented in this report are preliminary and upon coder review may  be revised to meet current compliance requirements. [name] MD, MD 04/26/2022 8:57:12 AM This  report has been signed electronically. Number of Addenda: 0 Note Initiated On: 04/26/2022 8:44 AM Estimated Blood Loss:  Estimated blood loss: none.      [facility]

## 2022-04-26 NOTE — Anesthesia Postprocedure Evaluation (Signed)
Anesthesia Post Note  Patient: Timothy Scott  Procedure(s) Performed: COLONOSCOPY WITH PROPOFOL ESOPHAGOGASTRODUODENOSCOPY (EGD)  Patient location during evaluation: PACU Anesthesia Type: General Level of consciousness: awake and alert, oriented and patient cooperative Pain management: pain level controlled Vital Signs Assessment: post-procedure vital signs reviewed and stable Respiratory status: spontaneous breathing, nonlabored ventilation and respiratory function stable Cardiovascular status: blood pressure returned to baseline and stable Postop Assessment: adequate PO intake Anesthetic complications: no   No notable events documented.   Last Vitals:  Vitals:   04/26/22 0923 04/26/22 0937  BP: 125/70 133/78  Pulse: (!) 51 (!) 51  Resp: 18 18  Temp: (!) 35.9 C   SpO2: 97% 100%    Last Pain:  Vitals:   04/26/22 0923  TempSrc: Temporal  PainSc:                  Darrin Nipper

## 2022-04-26 NOTE — H&P (Signed)
[name], MD [facility] [address], [place], [place] [address] [phone] Fax : [phone]  Primary Care Physician:  Glean Hess, MD Primary Gastroenterologist:  Dr. Allen Norris  Pre-Procedure History & Physical: HPI:  Timothy Scott is a 76 y.o. male is here for an endoscopy and colonoscopy.   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    Lattice degeneration of peripheral retina    Leg varices 03/10/2015   Osteoarthrosis, unspecified whether generalized or localized, hand    Polyp of transverse colon    Posttraumatic stress disorder    Sinus bradycardia 04/17/2014   Varicose veins of bilateral lower extremities with other complications    Wears dentures    partial upper and lower    Past Surgical History:  Procedure Laterality Date   CATARACT EXTRACTION Right 2015   CATARACT EXTRACTION W/PHACO Left 10/21/2015   Procedure: CATARACT EXTRACTION PHACO AND INTRAOCULAR LENS PLACEMENT ([place]);  Surgeon: Leandrew Koyanagi, MD;  Location: [facility];  Service: Ophthalmology;  Laterality: Left;   CATARACT EXTRACTION, BILATERAL  2016   COLONOSCOPY  2014   benign polyps   COLONOSCOPY WITH PROPOFOL N/A 03/10/2020   Procedure: COLONOSCOPY WITH PROPOFOL;  Surgeon: [name], MD;  Location: [facility] ENDOSCOPY;  Service: Endoscopy;  Laterality: N/A;  PRIORITY 4   CYSTOSCOPY     ESOPHAGEAL MANOMETRY N/A 08/03/2016   Procedure: ESOPHAGEAL MANOMETRY (EM);  Surgeon: Mauri Pole, MD;  Location: [facility] ENDOSCOPY;  Service: Endoscopy;  Laterality: N/A;   ESOPHAGOGASTRODUODENOSCOPY     ESOPHAGOGASTRODUODENOSCOPY (EGD) WITH PROPOFOL N/A 02/17/2016   with esoph dilatation    ESOPHAGOGASTRODUODENOSCOPY (EGD) WITH PROPOFOL N/A 03/10/2020   Procedure: ESOPHAGOGASTRODUODENOSCOPY (EGD) WITH PROPOFOL;  Surgeon: [name], MD;  Location: [facility] ENDOSCOPY;  Service: Endoscopy;  Laterality: N/A;   TONSILLECTOMY      Prior to Admission medications   Medication Sig Start Date End Date Taking? Authorizing Provider  buPROPion (WELLBUTRIN SR) 150 MG 12 hr tablet TAKE ONE TABLET BY MOUTH TWO TIMES A DAY (FOR POSTTRAUMATIC STRESS DISORDER AND MOOD) 03/12/21  Yes [provider]  Calcium Carbonate-Vit D-Min (CALCIUM 1200 PO) Take 1 tablet by mouth daily.    Yes [provider]  cyanocobalamin 1000 MCG tablet Take by mouth.   Yes [provider]  finasteride (PROSCAR) 5 MG tablet Take 5 mg by mouth as needed.    Yes [provider]  glucosamine-chondroitin 500-400 MG tablet Take 1 tablet by mouth daily.   Yes [provider]  Misc Natural Products (DAILY HERBS PROSTATE PO) Take 1 tablet by mouth daily.   Yes [provider]  Multiple Vitamins-Minerals (ZINC PO) Take by mouth daily.   Yes [provider]  naproxen (NAPROSYN) 500 MG tablet Take 500 mg by mouth 2 (two) times daily as needed for mild pain or moderate pain.    Yes [provider]  nortriptyline (PAMELOR) 10 MG capsule Take by mouth. 11/03/21  Yes [provider]  omeprazole (PRILOSEC) 40 MG capsule Take 40 mg by mouth daily.  09/27/18  Yes [provider]  risperiDONE (RISPERDAL) 0.25 MG tablet TAKE ONE-HALF TABLET BY MOUTH ONCE EVERY DAY AS NEEDED (FOR SLEEP,  IRRITABILITY AND ANXIOUSNESS) 11/03/20  Yes [provider]  tamsulosin (FLOMAX) 0.4 MG CAPS capsule Take 0.4 mg by mouth as needed.    Yes [provider]  traZODone (DESYREL) 100 MG tablet TAKE TWO TABLETS BY MOUTH AT BEDTIME FOR SLEEP. (YOU MAY TAKE A 3RD TABLET IF NEEDED, IF YOU WAKE UP DURING THE NIGHT) 03/12/21  Yes [provider]    Allergies as of  03/03/2022 - Review Complete 12/20/2021  Allergen Reaction Noted   Prazosin  07/28/2014    Family History  Problem Relation Age of Onset   Hypertension Mother    Cancer Mother        breast   Aneurysm Mother    Healthy Sister    Prostate cancer Neg Hx    Kidney cancer Neg Hx    Bladder Cancer Neg Hx     Social History   Socioeconomic History   Marital status: Divorced    Spouse name: Not on file   Number of children: 1   Years of education: some college   Highest education level: 12th grade  Occupational History   Occupation: Retired  Tobacco Use   Smoking status: Former    Packs/day: 0.50    Years: 20.00    Total pack years: 10.00    Types: Cigarettes, Pipe, Cigars    Quit date: 04/05/1986    Years since quitting: 36.0   Smokeless tobacco: Never   Tobacco comments:    smoking cessation materials not required  Vaping Use   Vaping Use: Never used  Substance and Sexual Activity   Alcohol use: Yes    Alcohol/week: 6.0 standard drinks of alcohol    Types: 6 Standard drinks or equivalent per week    Comment: socially   Drug use: No   Sexual activity: Not Currently  Other Topics Concern   Not on file  Social History Narrative   Pt lives alone   Social Determinants of Health   Financial Resource Strain: Low Risk  (12/20/2021)   Overall Financial Resource Strain (CARDIA)    Difficulty of Paying Living Expenses: Not hard at all  Food Insecurity: No Food Insecurity (12/20/2021)   Hunger Vital Sign    Worried About Running Out of Food in the Last Year: Never true    Ran Out of Food in the Last Year: Never true  Transportation Needs: No Transportation Needs (12/20/2021)   PRAPARE - Hydrologist (Medical): No    Lack of Transportation (Non-Medical): No  Physical Activity: Sufficiently Active (12/20/2021)   Exercise Vital Sign    Days of Exercise per Week: 5 days    Minutes of Exercise per Session: 30 min  Stress: Stress Concern Present  (12/20/2021)   [place]    Feeling of Stress : Very much  Social Connections: Socially Isolated (12/20/2021)   Social Connection and Isolation Panel [NHANES]    Frequency of Communication with Friends and Family: More than three times a week    Frequency of Social Gatherings with Friends and Family: Three times a week    Attends Religious Services: Never    Active Member of Clubs or Organizations: No    Attends Archivist Meetings: Never    Marital Status: Divorced  Human resources officer Violence: Not At Risk (12/20/2021)   Humiliation, Afraid, Rape, and Kick questionnaire    Fear of Current or Ex-Partner: No    Emotionally Abused: No  Physically Abused: No    Sexually Abused: No    Review of Systems: See HPI, otherwise negative ROS  Physical Exam: BP 130/70   Pulse (!) 57   Temp (!) 97.4 F (36.3 C) (Temporal)   Resp 18   Ht 5' 11.5" (1.816 m)   Wt 73.5 kg   SpO2 99%   BMI 22.28 kg/m  General:   Alert,  pleasant and cooperative in NAD Head:  Normocephalic and atraumatic. Neck:  Supple; no masses or thyromegaly. Lungs:  Clear throughout to auscultation.    Heart:  Regular rate and rhythm. Abdomen:  Soft, nontender and nondistended. Normal bowel sounds, without guarding, and without rebound.   Neurologic:  Alert and  oriented x4;  grossly normal neurologically.  Impression/Plan: Timothy Scott is here for an endoscopy and colonoscopy to be performed for gastric intestinal metaplasia and chang ein bowel habits  Risks, benefits, limitations, and alternatives regarding  endoscopy and colonoscopy have been reviewed with the patient.  Questions have been answered.  All parties agreeable.   [name], MD  04/26/2022, 8:36 AM

## 2022-04-27 ENCOUNTER — Encounter: Payer: Self-pay | Admitting: Gastroenterology

## 2022-04-27 LAB — SURGICAL PATHOLOGY

## 2022-05-03 ENCOUNTER — Encounter: Payer: Self-pay | Admitting: Gastroenterology

## 2022-05-06 ENCOUNTER — Telehealth: Payer: Self-pay | Admitting: Gastroenterology

## 2022-05-06 NOTE — Telephone Encounter (Signed)
Patient left vm asking if he can adjust the time of his upcoming appointment on 05/11/2022. Requesting a call back.

## 2022-05-11 ENCOUNTER — Ambulatory Visit (INDEPENDENT_AMBULATORY_CARE_PROVIDER_SITE_OTHER): Payer: PPO | Admitting: Gastroenterology

## 2022-05-11 ENCOUNTER — Encounter: Payer: Self-pay | Admitting: Gastroenterology

## 2022-05-11 VITALS — BP 143/69 | HR 53 | Temp 97.9°F | Wt 166.0 lb

## 2022-05-11 DIAGNOSIS — K31A0 Gastric intestinal metaplasia, unspecified: Secondary | ICD-10-CM | POA: Diagnosis not present

## 2022-05-11 NOTE — Progress Notes (Signed)
Primary Care Physician: Glean Hess, MD  Primary Gastroenterologist:  Dr. [name]  Chief Complaint  Patient presents with   Follow-up    HPI: Timothy Scott is a 76 y.o. male here for follow-up after having EGD and colonoscopy.  The patient has a history of gastric intestinal metaplasia and diarrhea.  The biopsies during these procedures showed:  DIAGNOSIS:  A. STOMACH, DISTAL ANTRUM, LESSER CURVATURE; COLD BIOPSY:  - CHRONIC ACTIVE GASTRITIS WITH BACKGROUND REACTIVE GASTROPATHY.  - INTESTINAL METAPLASIA IS PRESENT.  - NEGATIVE FOR H. PYLORI, DYSPLASIA, AND MALIGNANCY.   B. STOMACH, DISTAL ANTRUM, GREATER CURVATURE; COLD BIOPSY:  - GASTRIC OXYNTIC MUCOSA WITH NO SIGNIFICANT HISTOPATHOLOGIC CHANGE  - NEGATIVE FOR H. PYLORI, INTESTINAL METAPLASIA, DYSPLASIA, AND MALIGNANCY.   C. STOMACH, INCISURA, LESSER CURVATURE; COLD BIOPSY:  - GASTRIC OXYNTIC MUCOSA WITH NO SIGNIFICANT HISTOPATHOLOGIC CHANGE.  - NEGATIVE FOR H. PYLORI, INTESTINAL METAPLASIA, DYSPLASIA, AND MALIGNANCY.   D. COLON, RANDOM; COLD BIOPSY:  - BENIGN COLONIC MUCOSA WITH FOCAL CHANGES SUGGESTIVE OF HEALING MUCOSAL INJURY; OTHERWISE NO SIGNIFICANT HISTOPATHOLOGIC CHANGE.  - NEGATIVE FOR ACTIVE MUCOSAL COLITIS AND FEATURES OF MICROSCOPIC COLITIS.  - NEGATIVE FOR DYSPLASIA AND MALIGNANCY.   E. COLON POLYP, TRANSVERSE; COLD BIOPSY:  - POLYPOID FRAGMENT OF BENIGN COLONIC MUCOSA WITH SUPERFICIAL REACTIVE CHANGES.  - NEGATIVE FOR DYSPLASIA AND MALIGNANCY.   None of these findings would explain the patient's diarrhea.  The patient now comes in today for review of his symptoms and procedure results.  The patient now reports that his diarrhea has completely resolved and he only has issues with feeling hungry shortly after he ate.  The patient states that his weight has not come up and he feels that he is stuck at his present weight and in fact may continue to be losing weight without any signs of malabsorption or  maldigestion.   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    Lattice degeneration of peripheral retina    Leg varices 03/10/2015   Osteoarthrosis, unspecified whether generalized or localized, hand    Polyp of transverse colon    Posttraumatic stress disorder    Sinus bradycardia 04/17/2014   Varicose veins of bilateral lower extremities with other complications    Wears dentures    partial upper and lower    Current Outpatient Medications  Medication Sig Dispense Refill   buPROPion (WELLBUTRIN SR) 150 MG 12 hr tablet TAKE ONE TABLET BY MOUTH TWO TIMES A DAY (FOR POSTTRAUMATIC STRESS DISORDER AND MOOD)     Calcium Carbonate-Vit D-Min (CALCIUM 1200 PO) Take 1 tablet by mouth daily.      cyanocobalamin 1000 MCG tablet Take by mouth.     finasteride (PROSCAR) 5 MG tablet Take 5 mg by mouth as needed.      glucosamine-chondroitin 500-400 MG tablet Take 1 tablet by mouth daily.     Misc Natural Products (DAILY HERBS PROSTATE PO) Take 1 tablet by mouth daily.     Multiple Vitamins-Minerals (ZINC PO) Take by mouth daily.     naproxen (NAPROSYN) 500 MG tablet Take 500 mg by mouth 2 (two) times daily as needed for mild pain or moderate pain.  nortriptyline (PAMELOR) 10 MG capsule Take by mouth.     omeprazole (PRILOSEC) 40 MG capsule Take 40 mg by mouth daily.      risperiDONE (RISPERDAL) 0.25 MG tablet TAKE ONE-HALF TABLET BY MOUTH ONCE EVERY DAY AS NEEDED (FOR SLEEP, IRRITABILITY AND ANXIOUSNESS)     tamsulosin (FLOMAX) 0.4 MG CAPS capsule Take 0.4 mg by mouth as needed.      traZODone (DESYREL) 100 MG tablet TAKE TWO TABLETS BY MOUTH AT BEDTIME FOR SLEEP. (YOU MAY TAKE A 3RD TABLET IF NEEDED, IF YOU WAKE UP  DURING THE NIGHT)     No current facility-administered medications for this visit.    Allergies as of 05/11/2022 - Review Complete 05/11/2022  Allergen Reaction Noted   Prazosin  07/28/2014    ROS:  General: Negative for anorexia, weight loss, fever, chills, fatigue, weakness. ENT: Negative for hoarseness, difficulty swallowing , nasal congestion. CV: Negative for chest pain, angina, palpitations, dyspnea on exertion, peripheral edema.  Respiratory: Negative for dyspnea at rest, dyspnea on exertion, cough, sputum, wheezing.  GI: See history of present illness. GU:  Negative for dysuria, hematuria, urinary incontinence, urinary frequency, nocturnal urination.  Endo: Negative for unusual weight change.    Physical Examination:   BP (!) 143/69   Pulse (!) 53   Temp 97.9 F (36.6 C) (Oral)   Wt 166 lb (75.3 kg)   BMI 22.83 kg/m   General: Well-nourished, well-developed in no acute distress.  Eyes: No icterus. Conjunctivae pink. Neuro: Alert and oriented x 3.  Grossly intact. Skin: Warm and dry, no jaundice.   Psych: Alert and cooperative, normal mood and affect.  Labs:    Imaging Studies: No results found.  Assessment and Plan:   Timothy Scott is a 76 y.o. y/o male who comes in with a history of diarrhea which has resolved.  The patient also had an upper endoscopy with gastric intestinal metaplasia.  The patient will need a repeat upper endoscopy in 3 years for mapping of his gastric intestinal metaplasia.  The patient will also follow-up with his primary care provider for his continued weight loss without any signs of malabsorption or maldigestion.  The patient has been explained the plan agrees with it.     [name], MD. Marval Regal    Note: This dictation was prepared with Dragon dictation along with smaller phrase technology. Any transcriptional errors that result from this process are unintentional.

## 2022-05-26 ENCOUNTER — Ambulatory Visit (INDEPENDENT_AMBULATORY_CARE_PROVIDER_SITE_OTHER): Payer: PPO | Admitting: Internal Medicine

## 2022-05-26 ENCOUNTER — Encounter: Payer: Self-pay | Admitting: Internal Medicine

## 2022-05-26 ENCOUNTER — Other Ambulatory Visit
Admission: RE | Admit: 2022-05-26 | Discharge: 2022-05-26 | Disposition: A | Discharge status: Home / Self Care | Payer: PPO | Attending: Internal Medicine | Admitting: Internal Medicine

## 2022-05-26 VITALS — BP 118/66 | HR 50 | Ht 71.5 in | Wt 163.0 lb

## 2022-05-26 DIAGNOSIS — K219 Gastro-esophageal reflux disease without esophagitis: Secondary | ICD-10-CM | POA: Insufficient documentation

## 2022-05-26 DIAGNOSIS — J449 Chronic obstructive pulmonary disease, unspecified: Secondary | ICD-10-CM | POA: Insufficient documentation

## 2022-05-26 DIAGNOSIS — R739 Hyperglycemia, unspecified: Secondary | ICD-10-CM | POA: Insufficient documentation

## 2022-05-26 DIAGNOSIS — N138 Other obstructive and reflux uropathy: Secondary | ICD-10-CM | POA: Diagnosis not present

## 2022-05-26 DIAGNOSIS — I6522 Occlusion and stenosis of left carotid artery: Secondary | ICD-10-CM | POA: Diagnosis not present

## 2022-05-26 DIAGNOSIS — N401 Enlarged prostate with lower urinary tract symptoms: Secondary | ICD-10-CM | POA: Diagnosis not present

## 2022-05-26 DIAGNOSIS — I495 Sick sinus syndrome: Secondary | ICD-10-CM | POA: Diagnosis not present

## 2022-05-26 DIAGNOSIS — E538 Deficiency of other specified B group vitamins: Secondary | ICD-10-CM

## 2022-05-26 DIAGNOSIS — K31A0 Gastric intestinal metaplasia, unspecified: Secondary | ICD-10-CM

## 2022-05-26 DIAGNOSIS — Z0000 Encounter for general adult medical examination without abnormal findings: Secondary | ICD-10-CM | POA: Diagnosis not present

## 2022-05-26 LAB — CBC WITH DIFFERENTIAL/PLATELET
Abs Immature Granulocytes: 0.03 10*3/uL (ref 0.00–0.07)
Basophils Absolute: 0 10*3/uL (ref 0.0–0.1)
Basophils Relative: 1 %
Eosinophils Absolute: 0.1 10*3/uL (ref 0.0–0.5)
Eosinophils Relative: 2 %
HCT: 42.3 % (ref 39.0–52.0)
Hemoglobin: 14.6 g/dL (ref 13.0–17.0)
Immature Granulocytes: 1 %
Lymphocytes Relative: 14 %
Lymphs Abs: 0.9 10*3/uL (ref 0.7–4.0)
MCH: 33.2 pg (ref 26.0–34.0)
MCHC: 34.5 g/dL (ref 30.0–36.0)
MCV: 96.1 fL (ref 80.0–100.0)
Monocytes Absolute: 0.7 10*3/uL (ref 0.1–1.0)
Monocytes Relative: 10 %
Neutro Abs: 4.9 10*3/uL (ref 1.7–7.7)
Neutrophils Relative %: 72 %
Platelets: 281 10*3/uL (ref 150–400)
RBC: 4.4 MIL/uL (ref 4.22–5.81)
RDW: 12.8 % (ref 11.5–15.5)
WBC: 6.6 10*3/uL (ref 4.0–10.5)
nRBC: 0 % (ref 0.0–0.2)

## 2022-05-26 LAB — COMPREHENSIVE METABOLIC PANEL
ALT: 20 U/L (ref 0–44)
AST: 22 U/L (ref 15–41)
Albumin: 4.1 g/dL (ref 3.5–5.0)
Alkaline Phosphatase: 41 U/L (ref 38–126)
Anion gap: 6 (ref 5–15)
BUN: 14 mg/dL (ref 8–23)
CO2: 25 mmol/L (ref 22–32)
Calcium: 8.6 mg/dL — ABNORMAL LOW (ref 8.9–10.3)
Chloride: 104 mmol/L (ref 98–111)
Creatinine, Ser: 0.79 mg/dL (ref 0.61–1.24)
GFR, Estimated: >60 mL/min (ref >60)
Glucose, Bld: 104 mg/dL — ABNORMAL HIGH (ref 70–99)
Potassium: 4 mmol/L (ref 3.5–5.1)
Sodium: 135 mmol/L (ref 135–145)
Total Bilirubin: 0.4 mg/dL (ref 0.3–1.2)
Total Protein: 6.8 g/dL (ref 6.5–8.1)

## 2022-05-26 LAB — LIPID PANEL
Cholesterol: 185 mg/dL (ref 0–200)
HDL: 71 mg/dL (ref >40)
LDL Cholesterol: 107 mg/dL — ABNORMAL HIGH (ref 0–99)
Total CHOL/HDL Ratio: 2.6 RATIO
Triglycerides: 33 mg/dL (ref <150)
VLDL: 7 mg/dL (ref 0–40)

## 2022-05-26 LAB — HEMOGLOBIN A1C
Hgb A1c MFr Bld: 5.1 % (ref 4.8–5.6)
Mean Plasma Glucose: 99.67 mg/dL

## 2022-05-26 LAB — VITAMIN B12: Vitamin B-12: 1083 pg/mL — ABNORMAL HIGH (ref 180–914)

## 2022-05-26 NOTE — Progress Notes (Signed)
Date:  05/26/2022   Name:  Timothy Scott   DOB:  November 27, 1945   MRN:  [id number]   Chief Complaint: Annual Exam Timothy Scott is a 76 y.o. male who presents today for his Complete Annual Exam. He feels well. He reports exercising/ walking. He reports he is sleeping poorly.   Colonoscopy: 04/2022  Immunization History  Administered Date(s) Administered   H1N1 10/06/2008   Influenza, High Dose Seasonal PF 06/11/2019   Influenza, Seasonal, Injecte, Preservative Fre 08/03/2009, 07/19/2013   Influenza,inj,Quad PF,6+ Mos 07/21/2017, 06/18/2018   Influenza-Unspecified 07/18/2002, 08/22/2003, 07/15/2004, 07/07/2005, 07/25/2006, 08/21/2007, 05/22/2008, 10/06/2008, 08/05/2010, 07/07/2011, 06/05/2012, 07/06/2014, 07/02/2015, 09/06/2015, 07/06/2016, 06/05/2017, 06/18/2018, 07/06/2018, 06/06/2019, 06/30/2020   Moderna Covid-19 Vaccine Bivalent Booster 61yr & up 08/28/2021   Moderna Sars-Covid-2 Vaccination 11/04/2019, 12/05/2019, 07/14/2020, 12/29/2020   Pneumococcal Conjugate-13 01/28/2014, 05/28/2015   Pneumococcal Polysaccharide-23 03/30/2001, 09/06/2010, 09/19/2011   Tdap 09/06/2010, 09/19/2011   Zoster Recombinat (Shingrix) 08/07/2018, 10/12/2018   Zoster, Live 09/06/2010, 02/04/2011   Health Maintenance Due  Topic Date Due   TETANUS/TDAP  09/18/2021    Lab Results  Component Value Date   PSA1 0.2 05/09/2019   PSA1 0.2 02/23/2017   PSA 0.2 12/13/2017    Gastroesophageal Reflux He complains of heartburn. He reports no abdominal pain, no chest pain, no choking or no wheezing. This is a recurrent problem. The problem occurs occasionally. Associated symptoms include fatigue. He has tried a PPI for the symptoms.  He still has the throat symptoms - sometimes severe pain.  He may want to try gabapentin again - he is not sure he tried it for long enough. He has ongoing fatigue and admits to a poor diet.  He lives alone and does not cook.  He takes some supplements but had not used  nutritional shakes.  [facility] and has not resumed. His heart rate seems to run low at times - he has not follow up recently with Cardiology. 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 in [place]   Lab Results  Component Value Date   NA 135 05/20/2021   K 4.1 05/20/2021   CO2 26 05/20/2021   GLUCOSE 109 (H) 05/20/2021   BUN 17 05/20/2021   CREATININE 0.78 05/20/2021   CALCIUM 9.1 05/20/2021   GFRNONAA >60 05/20/2021   Lab Results  Component Value Date   CHOL 168 05/20/2021   HDL 68 05/20/2021   LDLCALC 86 05/20/2021   TRIG 70 05/20/2021   CHOLHDL 2.5 05/20/2021   Lab Results  Component Value Date   TSH 1.50 04/13/2020   Lab Results  Component Value Date   HGBA1C 5.1 06/01/2019   Lab Results  Component Value Date   WBC 8.3 05/20/2021   HGB 14.0 05/20/2021   HCT 40.1 05/20/2021   MCV 94.8 05/20/2021   PLT 290 05/20/2021   Lab Results  Component Value Date   ALT 20 05/20/2021   AST 23 05/20/2021   ALKPHOS 40 05/20/2021   BILITOT 0.9 05/20/2021   Lab Results  Component Value Date   VD25OH 32 04/13/2020     Review of Systems  Constitutional:  Positive for fatigue. Negative for appetite change, chills, diaphoresis and unexpected weight change.  HENT:  Positive for trouble swallowing. Negative for hearing loss, tinnitus and voice change.   Eyes:  Negative for visual  disturbance.  Respiratory:  Negative for choking, shortness of breath and wheezing.   Cardiovascular:  Positive for leg swelling (varicose veins). Negative for chest pain and palpitations.  Gastrointestinal:  Positive for heartburn. Negative for abdominal pain, blood in stool, constipation and diarrhea.  Genitourinary:  Negative for difficulty urinating, dysuria, frequency and urgency.  Musculoskeletal:  Positive for arthralgias. Negative for back pain and  myalgias.  Skin:  Negative for color change and rash.  Neurological:  Positive for headaches. Negative for dizziness and syncope.  Hematological:  Negative for adenopathy.  Psychiatric/Behavioral:  Positive for sleep disturbance. Negative for dysphoric mood. The patient is not nervous/anxious.     Patient Active Problem List   Diagnosis Date Noted   Gastric intestinal metaplasia    Change in bowel habits    Leg pain, bilateral 07/22/2021   Sensory polyneuropathy 07/22/2021   Intestinal metaplasia of gastric mucosa 05/20/2021   Senile nuclear sclerosis 12/02/2020   Restless legs syndrome 12/02/2020   Osteoarthritis of hip 12/02/2020   Myopia 12/02/2020   Esophageal reflux 12/02/2020   Hearing loss 12/02/2020   Actinic keratosis 12/02/2020   Chronic obstructive lung disease ([place]) 12/02/2020   Chronic neck pain 12/02/2020   Other ill-defined and unknown causes of morbidity and mortality 12/02/2020   Neurocardiogenic pre-syncope 10/13/2020   Dietary B12 deficiency 05/14/2020   Headache disorder 04/13/2020   Other diseases of stomach and duodenum    Hx of transient ischemic attack (TIA) 01/28/2020   Elevated left ventricular end-diastolic pressure (LVEDP) 06/01/2019   Moderate aortic regurgitation 06/01/2019   Carotid artery plaque, left 07/09/2018   Sick sinus syndrome ([place]) 03/09/2017   Anosmia 05/05/2016   Dysphagia 02/03/2016   Tobacco use disorder, moderate, in sustained remission 03/11/2015   Lumbar disc herniation with radiculopathy 03/11/2015   Degeneration of intervertebral disc 03/10/2015   BPH with obstruction/lower urinary tract symptoms 03/10/2015   Degenerative arthritis of finger 03/10/2015   Post-traumatic stress disorder 03/10/2015   Lattice degeneration 03/10/2015   Leg varices 03/10/2015    Allergies  Allergen Reactions   Prazosin     Other reaction(s): Dizziness    Past Surgical History:  Procedure Laterality Date   CATARACT EXTRACTION Right 2015    CATARACT EXTRACTION W/PHACO Left 10/21/2015   Procedure: CATARACT EXTRACTION PHACO AND INTRAOCULAR LENS PLACEMENT ([place]);  Surgeon: Leandrew Koyanagi, MD;  Location: [place];  Service: Ophthalmology;  Laterality: Left;   CATARACT EXTRACTION, BILATERAL  2016   COLONOSCOPY  2014   benign polyps   COLONOSCOPY WITH PROPOFOL N/A 03/10/2020   Procedure: COLONOSCOPY WITH PROPOFOL;  Surgeon: [name], MD;  Location: [facility] ENDOSCOPY;  Service: Endoscopy;  Laterality: N/A;  PRIORITY 4   COLONOSCOPY WITH PROPOFOL N/A 04/26/2022   Procedure: COLONOSCOPY WITH PROPOFOL;  Surgeon: [name], MD;  Location: [facility] ENDOSCOPY;  Service: Endoscopy;  Laterality: N/A;   CYSTOSCOPY     ESOPHAGEAL MANOMETRY N/A 08/03/2016   Procedure: ESOPHAGEAL MANOMETRY (EM);  Surgeon: Mauri Pole, MD;  Location: [facility] ENDOSCOPY;  Service: Endoscopy;  Laterality: N/A;   ESOPHAGOGASTRODUODENOSCOPY     ESOPHAGOGASTRODUODENOSCOPY N/A 04/26/2022   Procedure: ESOPHAGOGASTRODUODENOSCOPY (EGD);  Surgeon: [name], MD;  [facility] ENDOSCOPY;  Service: Endoscopy;  Laterality: N/A;   ESOPHAGOGASTRODUODENOSCOPY (EGD) WITH PROPOFOL N/A 02/17/2016   with esoph dilatation   ESOPHAGOGASTRODUODENOSCOPY (EGD) WITH PROPOFOL N/A 03/10/2020   Procedure: ESOPHAGOGASTRODUODENOSCOPY (EGD) WITH PROPOFOL;  Surgeon: [name], MD;  Location: [facility] ENDOSCOPY;  Service: Endoscopy;  Laterality: N/A;   TONSILLECTOMY  Social History   Tobacco Use   Smoking status: Former    Packs/day: 0.50    Years: 20.00    Total pack years: 10.00    Types: Cigarettes, Pipe, Cigars    Quit date: 04/05/1986    Years since quitting: 36.1   Smokeless tobacco: Never   Tobacco comments:    smoking cessation materials not required  Vaping Use   Vaping Use: Never used  Substance Use Topics   Alcohol use: Yes    Alcohol/week: 6.0 standard drinks of alcohol    Types: 6 Standard drinks or equivalent per week    Comment: socially   Drug use: No      Medication list has been reviewed and updated.  Current Meds  Medication Sig   Calcium Carbonate-Vit D-Min (CALCIUM 1200 PO) Take 1 tablet by mouth daily.    cyanocobalamin 1000 MCG tablet Take by mouth.   glucosamine-chondroitin 500-400 MG tablet Take 1 tablet by mouth daily.   Misc Natural Products (DAILY HERBS PROSTATE PO) Take 1 tablet by mouth daily.   Multiple Vitamins-Minerals (ZINC PO) Take by mouth daily.   naproxen (NAPROSYN) 500 MG tablet Take 500 mg by mouth 2 (two) times daily as needed for mild pain or moderate pain.    omeprazole (PRILOSEC) 40 MG capsule Take 40 mg by mouth daily.    tamsulosin (FLOMAX) 0.4 MG CAPS capsule Take 0.4 mg by mouth as needed.    traZODone (DESYREL) 100 MG tablet TAKE TWO TABLETS BY MOUTH AT BEDTIME FOR SLEEP. (YOU MAY TAKE A 3RD TABLET IF NEEDED, IF YOU WAKE UP DURING THE NIGHT)       05/26/2022    8:28 AM 05/20/2021    9:51 AM 04/01/2020    9:07 AM 01/28/2020    1:25 PM  GAD 7 : Generalized Anxiety Score  Nervous, Anxious, on Edge 1 0 1 2  Control/stop worrying 1 0 2 2  Worry too much - different things 1 0 2 2  Trouble relaxing 2 0 2 2  Restless 2 0 1 3  Easily annoyed or irritable 2 0 2 3  Afraid - awful might happen 1 0 1 3  Total GAD 7 Score 10 0 11 17  Anxiety Difficulty Very difficult  Not difficult at all Somewhat difficult       05/26/2022    8:28 AM 12/20/2021   10:35 AM 05/20/2021    9:51 AM  Depression screen PHQ 2/9  Decreased Interest 1 1 0  Down, Depressed, Hopeless 2 1 0  PHQ - 2 Score 3 2 0  Altered sleeping 3 2 0  Tired, decreased energy 2 2 0  Change in appetite 1 0 0  Feeling bad or failure about yourself  2 0 0  Trouble concentrating 1 1 0  Moving slowly or fidgety/restless 0 1 0  Suicidal thoughts 2 0 0  PHQ-9 Score 14 8 0  Difficult doing work/chores Somewhat difficult Not difficult at all Not difficult at all    BP Readings from Last 3 Encounters:  05/26/22 118/66  05/11/22 (!) 143/69   04/26/22 133/78    Physical Exam Vitals and nursing note reviewed.  Constitutional:      Appearance: Normal appearance. He is well-developed.  HENT:     Head: Normocephalic.     Right Ear: Tympanic membrane, ear canal and external ear normal.     Left Ear: Tympanic membrane, ear canal and external ear normal.     Nose: Nose  normal.  Eyes:     Conjunctiva/sclera: Conjunctivae normal.     Pupils: Pupils are equal, round, and reactive to light.  Neck:     Thyroid: No thyromegaly.     Vascular: No carotid bruit.  Cardiovascular:     Rate and Rhythm: Regular rhythm. Bradycardia present. No extrasystoles are present.    Pulses: Normal pulses.          Popliteal pulses are 2+ on the right side and 2+ on the left side.       Dorsalis pedis pulses are 2+ on the right side and 2+ on the left side.     Heart sounds: Normal heart sounds. No murmur heard.    Comments: Prominent non tender varicose veins in both lower legs  Pulmonary:     Effort: Pulmonary effort is normal.     Breath sounds: Decreased breath sounds present. No wheezing or rhonchi.  Chest:  Breasts:    Right: No mass.     Left: No mass.  Abdominal:     General: Bowel sounds are normal.     Palpations: Abdomen is soft.     Tenderness: There is no abdominal tenderness.  Musculoskeletal:     Cervical back: Normal range of motion and neck supple.     Left hip: No tenderness or crepitus. Decreased range of motion.     Right lower leg: No edema.     Left lower leg: No edema.  Lymphadenopathy:     Cervical: No cervical adenopathy.  Skin:    General: Skin is warm and dry.     Capillary Refill: Capillary refill takes less than 2 seconds.  Neurological:     General: No focal deficit present.     Mental Status: He is alert and oriented to person, place, and time.     Deep Tendon Reflexes: Reflexes are normal and symmetric.  Psychiatric:        Attention and Perception: Attention normal.        Mood and Affect: Mood  normal.        Thought Content: Thought content normal.     Wt Readings from Last 3 Encounters:  05/26/22 163 lb (73.9 kg)  05/11/22 166 lb (75.3 kg)  04/26/22 162 lb (73.5 kg)    BP 118/66   Pulse (!) 50   Ht 5' 11.5" (1.816 m)   Wt 163 lb (73.9 kg)   SpO2 97%   BMI 22.42 kg/m   Assessment and Plan: 1. Annual physical exam Exam unremarkable except for bradycardia Up to date on screenings and immunizations.  2. Chronic obstructive pulmonary disease, unspecified COPD type ([place]) Stable without symptoms of infection. On no medications at present. Consider RSV vaccine - Comprehensive metabolic panel  3. Gastroesophageal reflux disease, unspecified whether esophagitis present Symptoms well controlled on daily PPI No red flag signs such as weight loss, n/v, melena Will continue omeprazole - CBC with Differential/Platelet  4. BPH with obstruction/lower urinary tract symptoms Recommend follow up with Urology  5. Dietary B12 deficiency Continue oral supplement; check level - Vitamin B12  6. Elevated serum glucose Rule out early DM - Hemoglobin A1c  7. Sick sinus syndrome [facility]) May be the cause of fatigue Recommend cardiology follow up.  8. Gastric intestinal metaplasia Recent EGD with biopsies by Dr. Allen Norris Monitoring every few years  39. Carotid artery plaque, left Minimal plaque in 2020 with no recent evaluation Recommend ECASA 81 mg every other day Consider statin therapy - Lipid panel  Partially dictated using Editor, commissioning. Any errors are unintentional.  Halina Maidens, MD [facility]  05/26/2022

## 2022-06-06 DIAGNOSIS — M25552 Pain in left hip: Secondary | ICD-10-CM | POA: Diagnosis not present

## 2022-06-06 DIAGNOSIS — M79605 Pain in left leg: Secondary | ICD-10-CM | POA: Diagnosis not present

## 2022-06-06 DIAGNOSIS — R09A2 Foreign body sensation, throat: Secondary | ICD-10-CM | POA: Diagnosis not present

## 2022-06-06 DIAGNOSIS — G608 Other hereditary and idiopathic neuropathies: Secondary | ICD-10-CM | POA: Diagnosis not present

## 2022-06-06 DIAGNOSIS — R569 Unspecified convulsions: Secondary | ICD-10-CM | POA: Diagnosis not present

## 2022-06-06 DIAGNOSIS — H538 Other visual disturbances: Secondary | ICD-10-CM | POA: Diagnosis not present

## 2022-06-06 DIAGNOSIS — R55 Syncope and collapse: Secondary | ICD-10-CM | POA: Diagnosis not present

## 2022-06-06 DIAGNOSIS — I959 Hypotension, unspecified: Secondary | ICD-10-CM | POA: Diagnosis not present

## 2022-06-06 DIAGNOSIS — R5383 Other fatigue: Secondary | ICD-10-CM | POA: Diagnosis not present

## 2022-06-06 DIAGNOSIS — M79604 Pain in right leg: Secondary | ICD-10-CM | POA: Diagnosis not present

## 2022-06-06 DIAGNOSIS — R42 Dizziness and giddiness: Secondary | ICD-10-CM | POA: Diagnosis not present

## 2022-06-16 NOTE — Progress Notes (Signed)
06/17/2022 8:52 AM   Timothy Scott 30-Oct-1945 [id number]  Referring provider: Glean Hess, MD [address] [place],  [place] [address]  Urological history: 1. BPH w/ LU TS -PSA (2022) 0.36 -cysto (2019) -  Enlarged prostate Visually obstructive ~5 cm in length - Mild trabeculation with small saccules in the posterior well - Retroflexion shows intravesical lobe noted -cysto (2021) - Marked lateral lobe enlargement of prostate gland, hypervascular - Moderate bladder neck elevation - moderate trabeculation with cellules - Retroflexion shows intravesical median lobe.  -I PSS 12/2 -PVR 83 mL  -had been on tamsulosin 0.4 mg and finasteride 5 mg daily  2. High risk hematuria -former smoker -CTU (2019) - Mild prostate gland enlargement with mild circumferential wall thickening of the urinary bladder. -RUS (2021) - No acute or focal renal abnormality identified. Right extrarenal pelvis again noted.  No bladder distention. Bladder wall is again noted to be diffusely thickened at 0.7 cm. Prostate is prominent and irregular  -cysto (2019) -  Enlarged prostate Visually obstructive ~5 cm in length - Mild trabeculation with small saccules in the posterior well - Retroflexion shows intravesical lobe noted -cysto (2021) - Marked lateral lobe enlargement of prostate gland, hypervascular - Moderate bladder neck elevation - moderate trabeculation with cellules - Retroflexion shows intravesical median lobe.  -no reports of gross heme -UA 3-10 RBC's   3.  Incomplete bladder emptying -previous PVR's ~ 200 cc  -PVR 83 mL   Chief Complaint  Patient presents with   Benign Prostatic Hypertrophy   Hematuria    HPI: Timothy Scott is a 76 y.o. male who presents today for prostate issues such as frequent urination.  He is getting up several times a night for the last several months.    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    Lattice degeneration of peripheral retina    Leg varices 03/10/2015   Osteoarthrosis, unspecified whether generalized or localized, hand    Polyp of transverse colon    Posttraumatic stress disorder    Sinus bradycardia 04/17/2014   Varicose veins of bilateral lower extremities with other complications    Wears dentures    partial upper and lower    Surgical History: Past Surgical History:  Procedure Laterality Date   CATARACT EXTRACTION Right 2015   CATARACT EXTRACTION W/PHACO Left 10/21/2015   Procedure: CATARACT EXTRACTION PHACO AND INTRAOCULAR LENS PLACEMENT ([place]);  Surgeon: Leandrew Koyanagi, MD;  Location: [place];  Service: Ophthalmology;  Laterality: Left;   CATARACT EXTRACTION, BILATERAL  2016   COLONOSCOPY  2014   benign polyps   COLONOSCOPY WITH PROPOFOL N/A 03/10/2020   Procedure: COLONOSCOPY WITH PROPOFOL;  Surgeon: [name], MD;  Location: [facility] END[facility]COPY;  Service: Endoscopy;  Laterality: N/A;  PRIORITY 4   COLONOSCOPY WITH PROPOFOL N/A 04/26/2022   Procedure: COLONOSCOPY WITH PROPOFOL;  Surgeon: [name], MD;  Location: [facility] ENDOSCOPY;  Service: Endoscopy;  Laterality: N/A;   CYSTOSCOPY     ESOPHAGEAL MANOMETRY N/A 08/03/2016   Procedure: ESOPHAGEAL MANOMETRY (EM);  Surgeon: Mauri Pole, MD;  Location: [facility] ENDOSCOPY;  Service: Endoscopy;  Laterality: N/A;   ESOPHAGOGASTRODUODENOSCOPY     ESOPHAGOGASTRODUODENOSCOPY N/A 04/26/2022   Procedure: ESOPHAGOGASTRODUODENOSCOPY (EGD);  Surgeon: [name], MD;  [facility] ENDOSCOPY;  Service: Endoscopy;  Laterality: N/A;   ESOPHAGOGASTRODUODENOSCOPY (EGD) WITH PROPOFOL N/A 02/17/2016   with esoph dilatation    ESOPHAGOGASTRODUODENOSCOPY (EGD) WITH PROPOFOL N/A 03/10/2020   Procedure: ESOPHAGOGASTRODUODENOSCOPY (EGD) WITH PROPOFOL;  Surgeon: [name], MD;  Location: [facility] ENDOSCOPY;  Service: Endoscopy;  Laterality: N/A;   TONSILLECTOMY      Home Medications:  Allergies as of 06/17/2022       Reactions   Prazosin    Other reaction(s): Dizziness        Medication List        Accurate as of June 17, 2022  8:52 AM. If you have any questions, ask your nurse or doctor.          CALCIUM 1200 PO Take 1 tablet by mouth daily.   cyanocobalamin 1000 MCG tablet Take by mouth.   DAILY HERBS PROSTATE PO Take 1 tablet by mouth daily.   glucosamine-chondroitin 500-400 MG tablet Take 1 tablet by mouth daily.   naproxen 500 MG tablet Commonly known as: NAPROSYN Take 500 mg by mouth 2 (two) times daily as needed for mild pain or moderate pain.   omeprazole 40 MG capsule Commonly known as: PRILOSEC Take 40 mg by mouth daily.   pregabalin 25 MG capsule Commonly known as: LYRICA Take by mouth.   tamsulosin 0.4 MG Caps capsule Commonly known as: FLOMAX Take 0.4 mg by mouth as needed.   traZODone 100 MG tablet Commonly known as: DESYREL TAKE TWO TABLETS BY MOUTH AT BEDTIME FOR SLEEP. (YOU MAY TAKE A 3RD TABLET IF NEEDED, IF YOU WAKE UP DURING THE NIGHT)   ZINC PO Take by mouth daily.        Allergies:  Allergies  Allergen Reactions   Prazosin     Other reaction(s): Dizziness    Family History: Family History  Problem Relation Age of Onset   Hypertension Mother    Cancer Mother        breast   Aneurysm Mother    Healthy Sister    Prostate cancer Neg Hx    Kidney cancer Neg Hx    Bladder Cancer Neg Hx     Social History:  reports that he quit smoking about 36 years ago. His smoking use included cigarettes, pipe, and cigars. He has a 10.00 pack-year smoking history. He has never used smokeless tobacco. He reports current alcohol use of about 6.0 standard drinks  of alcohol per week. He reports that he does not use drugs.  ROS: Pertinent ROS in HPI  Physical Exam: BP 113/62   Pulse (!) 58   Ht '5\' 11"'$  (1.803 m)   Wt 167 lb (75.8 kg)   BMI 23.29 kg/m   Constitutional:  Well nourished. Alert and oriented, No acute distress. HEENT: [place] AT, moist mucus membranes.  Trachea midline, no masses. Cardiovascular: No clubbing, cyanosis, or edema. Respiratory: Normal respiratory effort, no increased work of breathing. GI: Abdomen is soft, non tender, non distended, no abdominal masses. Liver and spleen not palpable.  No hernias appreciated.  Stool sample for occult testing is not indicated.   GU: No CVA tenderness.  No bladder fullness or masses.  Patient with circumcised/uncircumcised phallus. ***Foreskin easily retracted***  Urethral meatus is patent.  No penile discharge. No penile lesions or rashes. Scrotum without lesions, cysts, rashes and/or edema.  Testicles  are located scrotally bilaterally. No masses are appreciated in the testicles. Left and right epididymis are normal. Rectal: Patient with  normal sphincter tone. Anus and perineum without scarring or rashes. No rectal masses are appreciated. Prostate is approximately *** grams, *** nodules are appreciated. Seminal vesicles are normal. Skin: No rashes, bruises or suspicious lesions. Lymph: No cervical or inguinal adenopathy. Neurologic: Grossly intact, no focal deficits, moving all 4 extremities. Psychiatric: Normal mood and affect.  Laboratory Data: Lab Results  Component Value Date   WBC 6.6 05/26/2022   HGB 14.6 05/26/2022   HCT 42.3 05/26/2022   MCV 96.1 05/26/2022   PLT 281 05/26/2022    Lab Results  Component Value Date   CREATININE 0.79 05/26/2022    Lab Results  Component Value Date   HGBA1C 5.1 05/26/2022       Component Value Date/Time   CHOL 185 05/26/2022 0909   CHOL 149 05/09/2019 1032   HDL 71 05/26/2022 0909   HDL 60 05/09/2019 1032   CHOLHDL 2.6 05/26/2022 0909    VLDL 7 05/26/2022 0909   LDLCALC 107 (H) 05/26/2022 0909   LDLCALC 61 05/09/2019 1032    Lab Results  Component Value Date   AST 22 05/26/2022   Lab Results  Component Value Date   ALT 20 05/26/2022     Urinalysis ***  I have reviewed the labs.   Pertinent Imaging: *** Assessment & Plan:  ***  1. BPH w/ LU TS -PSA stable *** -DRE benign *** -UA benign *** -PVR < 300 cc *** -symptoms - *** -most bothersome symptoms are *** -continue conservative management, avoiding bladder irritants and timed voiding's -Initiate alpha-blocker (***), discussed side effects *** -Initiate 5 alpha reductase inhibitor (***), discussed side effects *** -Continue tamsulosin 0.4 mg daily, alfuzosin 10 mg daily, Rapaflo 8 mg daily, terazosin, doxazosin, Cialis 5 mg daily and finasteride 5 mg daily, dutasteride 0.5 mg daily***:refills given -with very significant disease based previous cysto's would encourage seriously consider a bladder outlet procedure in the future*** -Cannot tolerate medication or medication failure, schedule cystoscopy ***  2. High risk hematuria -Former smoker -Work-up in 2021 -negative for malignant findings -No reports of gross heme -UA ***    No follow-ups on file.  These notes generated with voice recognition software. I apologize for typographical errors.  [place], [place] [address]  [place], [place] [address] [phone]

## 2022-06-17 ENCOUNTER — Ambulatory Visit: Payer: PPO | Admitting: Urology

## 2022-06-17 VITALS — BP 113/62 | HR 58 | Ht 71.0 in | Wt 167.0 lb

## 2022-06-17 DIAGNOSIS — R3129 Other microscopic hematuria: Secondary | ICD-10-CM | POA: Diagnosis not present

## 2022-06-17 DIAGNOSIS — N401 Enlarged prostate with lower urinary tract symptoms: Secondary | ICD-10-CM

## 2022-06-17 DIAGNOSIS — R351 Nocturia: Secondary | ICD-10-CM | POA: Diagnosis not present

## 2022-06-17 DIAGNOSIS — N138 Other obstructive and reflux uropathy: Secondary | ICD-10-CM

## 2022-06-17 DIAGNOSIS — R319 Hematuria, unspecified: Secondary | ICD-10-CM

## 2022-06-17 LAB — URINALYSIS, COMPLETE
Bilirubin, UA: NEGATIVE
Glucose, UA: NEGATIVE
Ketones, UA: NEGATIVE
Nitrite, UA: NEGATIVE
Protein,UA: NEGATIVE
Specific Gravity, UA: 1.025 (ref 1.005–1.030)
Urobilinogen, Ur: 0.2 mg/dL (ref 0.2–1.0)
pH, UA: 6 (ref 5.0–7.5)

## 2022-06-17 LAB — BLADDER SCAN AMB NON-IMAGING

## 2022-06-17 LAB — MICROSCOPIC EXAMINATION: Bacteria, UA: NONE SEEN

## 2022-06-18 ENCOUNTER — Encounter: Payer: Self-pay | Admitting: Urology

## 2022-06-22 LAB — CULTURE, URINE COMPREHENSIVE

## 2022-07-05 ENCOUNTER — Ambulatory Visit
Admission: RE | Admit: 2022-07-05 | Discharge: 2022-07-05 | Disposition: A | Discharge status: Home / Self Care | Payer: PPO | Source: Ambulatory Visit | Attending: Urology | Admitting: Urology

## 2022-07-05 DIAGNOSIS — N3289 Other specified disorders of bladder: Secondary | ICD-10-CM | POA: Diagnosis not present

## 2022-07-05 DIAGNOSIS — R319 Hematuria, unspecified: Secondary | ICD-10-CM | POA: Diagnosis not present

## 2022-07-05 DIAGNOSIS — R3129 Other microscopic hematuria: Secondary | ICD-10-CM | POA: Diagnosis not present

## 2022-07-05 DIAGNOSIS — K5730 Diverticulosis of large intestine without perforation or abscess without bleeding: Secondary | ICD-10-CM | POA: Diagnosis not present

## 2022-07-05 MED ORDER — IOHEXOL 300 MG/ML  SOLN
125.0000 mL | Freq: Once | INTRAMUSCULAR | Status: AC | PRN
  Administered 2022-07-05: 125 mL via INTRAVENOUS

## 2022-07-18 NOTE — Progress Notes (Deleted)
07/19/2022 9:51 AM   Timothy Scott 21-Dec-1945 [id number]  Referring provider: Glean Hess, MD [address] [place],  [place] [address]  Urological history: 1. BPH w/ LU TS -PSA (2022) 0.36 -cysto (2019) -  Enlarged prostate Visually obstructive ~5 cm in length - Mild trabeculation with small saccules in the posterior well - Retroflexion shows intravesical lobe noted -cysto (2021) - Marked lateral lobe enlargement of prostate gland, hypervascular - Moderate bladder neck elevation - moderate trabeculation with cellules - Retroflexion shows intravesical median lobe.  -I PSS *** -PVR *** mL  -had been on tamsulosin 0.4 mg and finasteride 5 mg daily in the past, has not taken in two years  2. High risk hematuria -former smoker -CTU (2019) - Mild prostate gland enlargement with mild circumferential wall thickening of the urinary bladder. -RUS (2021) - No acute or focal renal abnormality identified. Right extrarenal pelvis again noted.  No bladder distention. Bladder wall is again noted to be diffusely thickened at 0.7 cm. Prostate is prominent and irregular  -cysto (2019) -  Enlarged prostate Visually obstructive ~5 cm in length - Mild trabeculation with small saccules in the posterior well - Retroflexion shows intravesical lobe noted -cysto (2021) - Marked lateral lobe enlargement of prostate gland, hypervascular - Moderate bladder neck elevation - moderate trabeculation with cellules - Retroflexion shows intravesical median lobe.  -no reports of gross heme -UA ***  3.  Incomplete bladder emptying -previous PVR's ~ 200 cc  -PVR *** mL   No chief complaint on file.   HPI: Timothy Scott is a 76 y.o. male who presents today for CT urogram results.  At his visit on 06/18/2022, he was getting up several times a night for the last several months.  He had stopped his tamsulosin and finasteride approximately 2 years ago.  He was a [place] veteran and had a possible  history of exposure to agent orange.  Per patient, he has a remote history of nephrolithiasis.  UA yellow clear, SG 1.025, pH 6.0, trace leukocytes, trace RBC, 0-5  WBC's, 3-10 RBC's and 0-10 epithelial cells.  Urine culture was negative.    At that appointment, he was restarted on his tamsulosin 0.4 mg daily and his finasteride 5 mg daily.  He also underwent a CT urogram for further investigation into his micro heme.  CT Urogram (07/05/2022) -bladder wall was thickened with numerous diverticula no prostatic megaly with hypertrophy of the median lobe.  Prostate ~ 60 cc.        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    Lattice degeneration of peripheral retina    Leg varices 03/10/2015   Osteoarthrosis, unspecified whether generalized or localized, hand    Polyp of transverse colon    Posttraumatic stress disorder    Sinus bradycardia 04/17/2014   Varicose veins of bilateral lower extremities with other complications    Wears dentures    partial upper and lower    Surgical History: Past Surgical History:  Procedure Laterality Date   CATARACT EXTRACTION Right 2015   CATARACT EXTRACTION W/PHACO Left 10/21/2015  Procedure: CATARACT EXTRACTION PHACO AND INTRAOCULAR LENS PLACEMENT (IOC);  Surgeon: Leandrew Koyanagi, MD;  Location: [place];  Service: Ophthalmology;  Laterality: Left;   CATARACT EXTRACTION, BILATERAL  2016   COLONOSCOPY  2014   benign polyps   COLONOSCOPY WITH PROPOFOL N/A 03/10/2020   Procedure: COLONOSCOPY WITH PROPOFOL;  Surgeon: [name], MD;  Location: [facility] ENDOSCOPY;  Service: Endoscopy;  Laterality: N/A;   PRIORITY 4   COLONOSCOPY WITH PROPOFOL N/A 04/26/2022   Procedure: COLONOSCOPY WITH PROPOFOL;  Surgeon: [name], MD;  Location: [facility] ENDOSCOPY;  Service: Endoscopy;  Laterality: N/A;   CYSTOSCOPY     ESOPHAGEAL MANOMETRY N/A 08/03/2016   Procedure: ESOPHAGEAL MANOMETRY (EM);  Surgeon: Mauri Pole, MD;  Location: [facility] ENDOSCOPY;  Service: Endoscopy;  Laterality: N/A;   ESOPHAGOGASTRODUODENOSCOPY     ESOPHAGOGASTRODUODENOSCOPY N/A 04/26/2022   Procedure: ESOPHAGOGASTRODUODENOSCOPY (EGD);  Surgeon: [name], MD;  Location: [facility] ENDOSCOPY;  Service: Endoscopy;  Laterality: N/A;   ESOPHAGOGASTRODUODENOSCOPY (EGD) WITH PROPOFOL N/A 02/17/2016   with esoph dilatation   ESOPHAGOGASTRODUODENOSCOPY (EGD) WITH PROPOFOL N/A 03/10/2020   Procedure: ESOPHAGOGASTRODUODENOSCOPY (EGD) WITH PROPOFOL;  Surgeon: [name], MD;  Location: [facility] ENDOSCOPY;  Service: Endoscopy;  Laterality: N/A;   TONSILLECTOMY      Home Medications:  Allergies as of 07/19/2022       Reactions   Prazosin    Other reaction(s): Dizziness        Medication List        Accurate as of July 18, 2022  9:51 AM. If you have any questions, ask your nurse or doctor.          CALCIUM 1200 PO Take 1 tablet by mouth daily.   cyanocobalamin 1000 MCG tablet Take by mouth.   DAILY HERBS PROSTATE PO Take 1 tablet by mouth daily.   glucosamine-chondroitin 500-400 MG tablet Take 1 tablet by mouth daily.   naproxen 500 MG tablet Commonly known as: NAPROSYN Take 500 mg by mouth 2 (two) times daily as needed for mild pain or moderate pain.   omeprazole 40 MG capsule Commonly known as: PRILOSEC Take 40 mg by mouth daily.   pregabalin 25 MG capsule Commonly known as: LYRICA Take by mouth.   tamsulosin 0.4 MG Caps capsule Commonly known as: FLOMAX Take 0.4 mg by mouth as needed.   traZODone 100 MG tablet Commonly known as: DESYREL TAKE TWO TABLETS BY MOUTH AT BEDTIME FOR SLEEP. (YOU MAY TAKE A 3RD  TABLET IF NEEDED, IF YOU WAKE UP DURING THE NIGHT)   ZINC PO Take by mouth daily.        Allergies:  Allergies  Allergen Reactions   Prazosin     Other reaction(s): Dizziness    Family History: Family History  Problem Relation Age of Onset   Hypertension Mother    Cancer Mother        breast   Aneurysm Mother    Healthy Sister    Prostate cancer Neg Hx    Kidney cancer Neg Hx    Bladder Cancer Neg Hx     Social History:  reports that he quit smoking about 36 years ago. His smoking use included cigarettes, pipe, and cigars. He has a 10.00 pack-year smoking history. He has never used smokeless tobacco. He reports current alcohol use of about 6.0 standard drinks of alcohol per week. He reports that he does not use drugs.  ROS: Pertinent ROS in HPI  Physical Exam: There were no vitals taken for this visit.  Constitutional:  Well nourished. Alert and oriented, No acute distress. HEENT: [place] AT, moist mucus membranes.  Trachea midline Cardiovascular: No clubbing, cyanosis, or edema. Respiratory: Normal respiratory effort, no increased work of breathing. Neurologic: Grossly intact, no focal deficits, moving all 4 extremities. Psychiatric: Normal mood and affect.   Laboratory Data: N/A   Pertinent Imaging: ***   Assessment & Plan:    1. BPH w/ LU TS -UA *** -PVR < 300 cc  -most bothersome symptoms are nocturia -continue conservative management, avoiding bladder irritants and timed voiding's -***  2. High risk hematuria -Former smoker -Work-up in 2021 -negative for malignant findings -CTU (06/2022) - prostatomegaly and bladder diverticula  -No reports of gross heme -UA ***   3. Nocturia -***    No follow-ups on file.  These notes generated with voice recognition software. I apologize for typographical errors.  [place], [place] [address]  [place], [place] [address] [phone]

## 2022-07-19 ENCOUNTER — Ambulatory Visit: Payer: PPO | Admitting: Urology

## 2022-07-19 DIAGNOSIS — R3129 Other microscopic hematuria: Secondary | ICD-10-CM

## 2022-07-19 DIAGNOSIS — R351 Nocturia: Secondary | ICD-10-CM

## 2022-07-19 DIAGNOSIS — N138 Other obstructive and reflux uropathy: Secondary | ICD-10-CM

## 2022-07-20 ENCOUNTER — Telehealth (INDEPENDENT_AMBULATORY_CARE_PROVIDER_SITE_OTHER): Payer: PPO | Admitting: Internal Medicine

## 2022-07-20 ENCOUNTER — Encounter: Payer: Self-pay | Admitting: Internal Medicine

## 2022-07-20 ENCOUNTER — Ambulatory Visit: Payer: Self-pay

## 2022-07-20 ENCOUNTER — Telehealth: Payer: Self-pay | Admitting: Internal Medicine

## 2022-07-20 VITALS — Ht 71.0 in

## 2022-07-20 DIAGNOSIS — U071 COVID-19: Secondary | ICD-10-CM | POA: Diagnosis not present

## 2022-07-20 MED ORDER — MOLNUPIRAVIR EUA 200MG CAPSULE: 4.0000 | ORAL_CAPSULE | Freq: Two times a day (BID) | ORAL | 0 refills | Status: AC

## 2022-07-20 MED ORDER — PROMETHAZINE-DM 6.25-15 MG/5ML PO SYRP: 5.0000 mL | ORAL_SOLUTION | Freq: Four times a day (QID) | ORAL | 0 refills | Status: AC | PRN

## 2022-07-20 NOTE — Telephone Encounter (Signed)
Summary: positive covid, cough, chills, nasal congestion   Patient called in tested positive for covid today with home test, has cough, chills nasal congestion. OTC meds not helping. He requested an antibioitc but I let him know he has to have an appt for that, didn't show an appt until tomorrow. He was admant about appt today.         Chief Complaint: positive covid at home test Monday 07/18/22 Symptoms: cough productive clear, nasal drip. Congestion chest soreness from coughing  Frequency: last week nasal drip worsening sx Sunday 07/17/22 Pertinent Negatives: Patient denies chest pain no difficulty breathing. No fever Disposition: '[]'$ ED /'[]'$ Urgent Care (no appt availability in office) / '[]'$ Appointment(In office/virtual)/ '[]'$  [place] [facility]/ '[]'$ Home Care/ '[]'$ Refused Recommended Disposition /'[]'$ [place] [facility]/ '[x]'$  Follow-up with PCP Additional Notes:   Offered VV appt. Patient reports he has never done VV and prefer not to schedule. Please advise if appt needed . Requesting medication.        Reason for Disposition  [1] HIGH RISK patient (e.g., weak immune system, age > 23 years, obesity with BMI 30 or higher, pregnant, chronic lung disease or other chronic medical condition) AND [2] COVID symptoms (e.g., cough, fever)  (Exceptions: Already seen by PCP and no new or worsening symptoms.)  Answer Assessment - Initial Assessment Questions 1. COVID-19 DIAGNOSIS: "How do you know that you have COVID?" (e.g., positive lab test or self-test, diagnosed by doctor or NP/PA, symptoms after exposure).     Positive covid at home test Monday 07/18/22 2. COVID-19 EXPOSURE: "Was there any known exposure to COVID before the symptoms began?" CDC Definition of close contact: within 6 feet (2 meters) for a total of 15 minutes or more over a 24-hour period.      na 3. ONSET: "When did the COVID-19 symptoms start?"      Nasal drip last week . Cough noted  4. WORST SYMPTOM: "What is your worst  symptom?" (e.g., cough, fever, shortness of breath, muscle aches)     Cough, congestion . Last week nasal drip 5. COUGH: "Do you have a cough?" If Yes, ask: "How bad is the cough?"       Yes  6. FEVER: "Do you have a fever?" If Yes, ask: "What is your temperature, how was it measured, and when did it start?"     no 7. RESPIRATORY STATUS: "Describe your breathing?" (e.g., normal; shortness of breath, wheezing, unable to speak)      normal 8. BETTER-SAME-WORSE: "Are you getting better, staying the same or getting worse compared to yesterday?"  If getting worse, ask, "In what way?"     na 9. OTHER SYMPTOMS: "Do you have any other symptoms?"  (e.g., chills, fatigue, headache, loss of smell or taste, muscle pain, sore throat)     See above  10. HIGH RISK DISEASE: "Do you have any chronic medical problems?" (e.g., asthma, heart or lung disease, weak immune system, obesity, etc.)       Hx COPD 11. VACCINE: "Have you had the COVID-19 vaccine?" If Yes, ask: "Which one, how many shots, when did you get it?"       Yes and boosters but not this year  12. PREGNANCY: "Is there any chance you are pregnant?" "When was your last menstrual period?"       Na 13. O2 SATURATION MONITOR:  "Do you use an oxygen saturation monitor (pulse oximeter) at home?" If Yes, ask "What is your reading (oxygen level) today?" "What is  your usual oxygen saturation reading?" (e.g., 95%)       na  Protocols used: Coronavirus (COVID-19) Diagnosed or Suspected-A-AH

## 2022-07-20 NOTE — Telephone Encounter (Signed)
Pt has an appt today.  KP

## 2022-07-20 NOTE — Telephone Encounter (Signed)
Message from [place] sent at 07/20/2022  9:15 AM EST  Summary: positive covid, cough, chills, nasal congestion   Patient called in tested positive for covid today with home test, has cough, chills nasal congestion. OTC meds not helping. He requested an antibioitc but I let him know he has to have an appt for that, didn't show an appt until tomorrow. He was admant about appt today.        Called pt and LM on VM to call back to discuss sx. No appt's are available today.

## 2022-07-20 NOTE — Progress Notes (Signed)
Date:  07/20/2022   Name:  Timothy Scott   DOB:  08/28/1946   MRN:  [id number]  This encounter was conducted via video encounter due to the need for social distancing in light of the Covid-19 pandemic.  The patient was correctly identified.  I advised that I am conducting the visit from a secure room in my office at [facility] clinic.  The patient is located at home. The limitations of this form of encounter were discussed with the patient and he/she agreed to proceed.  Some vital signs will be absent.  Chief Complaint: Covid Positive (X 3 days,)  Cough This is a new problem. Episode onset: three days ago. The problem occurs every few minutes. The cough is Non-productive. Associated symptoms include chills, ear congestion and nasal congestion. Pertinent negatives include no chest pain, fever, headaches, shortness of breath or wheezing. Risk factors: tested positive for Covid.    Lab Results  Component Value Date   NA 135 05/26/2022   K 4.0 05/26/2022   CO2 25 05/26/2022   GLUCOSE 104 (H) 05/26/2022   BUN 14 05/26/2022   CREATININE 0.79 05/26/2022   CALCIUM 8.6 (L) 05/26/2022   GFRNONAA >60 05/26/2022   Lab Results  Component Value Date   CHOL 185 05/26/2022   HDL 71 05/26/2022   LDLCALC 107 (H) 05/26/2022   TRIG 33 05/26/2022   CHOLHDL 2.6 05/26/2022   Lab Results  Component Value Date   TSH 1.50 04/13/2020   Lab Results  Component Value Date   HGBA1C 5.1 05/26/2022   Lab Results  Component Value Date   WBC 6.6 05/26/2022   HGB 14.6 05/26/2022   HCT 42.3 05/26/2022   MCV 96.1 05/26/2022   PLT 281 05/26/2022   Lab Results  Component Value Date   ALT 20 05/26/2022   AST 22 05/26/2022   ALKPHOS 41 05/26/2022   BILITOT 0.4 05/26/2022   Lab Results  Component Value Date   VD25OH 32 04/13/2020     Review of Systems  Constitutional:  Positive for chills and fatigue. Negative for fever.  Respiratory:  Positive for cough. Negative for shortness of breath  and wheezing.   Cardiovascular:  Negative for chest pain and palpitations.  Neurological:  Negative for dizziness and headaches.    Patient Active Problem List   Diagnosis Date Noted   Gastric intestinal metaplasia    Change in bowel habits    Leg pain, bilateral 07/22/2021   Sensory polyneuropathy 07/22/2021   Intestinal metaplasia of gastric mucosa 05/20/2021   Senile nuclear sclerosis 12/02/2020   Restless legs syndrome 12/02/2020   Osteoarthritis of hip 12/02/2020   Myopia 12/02/2020   Esophageal reflux 12/02/2020   Hearing loss 12/02/2020   Actinic keratosis 12/02/2020   Chronic obstructive lung disease ([place]) 12/02/2020   Chronic neck pain 12/02/2020   Other ill-defined and unknown causes of morbidity and mortality 12/02/2020   Neurocardiogenic pre-syncope 10/13/2020   Dietary B12 deficiency 05/14/2020   Headache disorder 04/13/2020   Other diseases of stomach and duodenum    Hx of transient ischemic attack (TIA) 01/28/2020   Elevated left ventricular end-diastolic pressure (LVEDP) 06/01/2019   Moderate aortic regurgitation 06/01/2019   Carotid artery plaque, left 07/09/2018   Sick sinus syndrome ([place]) 03/09/2017   Anosmia 05/05/2016   Dysphagia 02/03/2016   Tobacco use disorder, moderate, in sustained remission 03/11/2015   Lumbar disc herniation with radiculopathy 03/11/2015   Degeneration of intervertebral disc 03/10/2015   BPH with  obstruction/lower urinary tract symptoms 03/10/2015   Degenerative arthritis of finger 03/10/2015   Post-traumatic stress disorder 03/10/2015   Lattice degeneration 03/10/2015   Leg varices 03/10/2015    Allergies  Allergen Reactions   Prazosin     Other reaction(s): Dizziness    Past Surgical History:  Procedure Laterality Date   CATARACT EXTRACTION Right 2015   CATARACT EXTRACTION W/PHACO Left 10/21/2015   Procedure: CATARACT EXTRACTION PHACO AND INTRAOCULAR LENS PLACEMENT ([place]);  Surgeon: Leandrew Koyanagi, MD;  Location:  [place];  Service: Ophthalmology;  Laterality: Left;   CATARACT EXTRACTION, BILATERAL  2016   COLONOSCOPY  2014   benign polyps   COLONOSCOPY WITH PROPOFOL N/A 03/10/2020   Procedure: COLONOSCOPY WITH PROPOFOL;  Surgeon: [name], MD;  Location: [facility] ENDOSCOPY;  Service: Endoscopy;  Laterality: N/A;  PRIORITY 4   COLONOSCOPY WITH PROPOFOL N/A 04/26/2022   Procedure: COLONOSCOPY WITH PROPOFOL;  Surgeon: [name], MD;  Location: [facility] ENDOSCOPY;  Service: Endoscopy;  Laterality: N/A;   CYSTOSCOPY     ESOPHAGEAL MANOMETRY N/A 08/03/2016   Procedure: ESOPHAGEAL MANOMETRY (EM);  Surgeon: Mauri Pole, MD;  Location: [facility] ENDOSCOPY;  Service: Endoscopy;  Laterality: N/A;   ESOPHAGOGASTRODUODENOSCOPY     ESOPHAGOGASTRODUODENOSCOPY N/A 04/26/2022   Procedure: ESOPHAGOGASTRODUODENOSCOPY (EGD);  Surgeon: [name], MD;  Location: [facility]COPY;  Service: Endoscopy;  Laterality: N/A;   ESOPHAGOGASTRODUODENOSCOPY (EGD) WITH PROPOFOL N/A 02/17/2016   with esoph dilatation   ESOPHAGOGASTRODUODENOSCOPY (EGD) WITH PROPOFOL N/A 03/10/2020   Procedure: ESOPHAGOGASTRODUODENOSCOPY (EGD) WITH PROPOFOL;  Surgeon: [name], MD;  Location: [facility] ENDOSCOPY;  Service: Endoscopy;  Laterality: N/A;   TONSILLECTOMY      Social History   Tobacco Use   Smoking status: Former    Packs/day: 0.50    Years: 20.00    Total pack years: 10.00    Types: Cigarettes, Pipe, Cigars    Quit date: 04/05/1986    Years since quitting: 36.3   Smokeless tobacco: Never   Tobacco comments:    smoking cessation materials not required  Vaping Use   Vaping Use: Never used  Substance Use Topics   Alcohol use: Yes    Alcohol/week: 6.0 standard drinks of alcohol    Types: 6 Standard drinks or equivalent per week    Comment: socially   Drug use: No     Medication list has been reviewed and updated.  Current Meds  Medication Sig   Calcium Carbonate-Vit D-Min (CALCIUM 1200 PO) Take 1 tablet by mouth daily.     cyanocobalamin 1000 MCG tablet Take by mouth.   glucosamine-chondroitin 500-400 MG tablet Take 1 tablet by mouth daily.   Misc Natural Products (DAILY HERBS PROSTATE PO) Take 1 tablet by mouth daily.   Multiple Vitamins-Minerals (ZINC PO) Take by mouth daily.   naproxen (NAPROSYN) 500 MG tablet Take 500 mg by mouth 2 (two) times daily as needed for mild pain or moderate pain.    omeprazole (PRILOSEC) 40 MG capsule Take 40 mg by mouth daily.    pregabalin (LYRICA) 25 MG capsule Take by mouth.   tamsulosin (FLOMAX) 0.4 MG CAPS capsule Take 0.4 mg by mouth as needed.   traZODone (DESYREL) 100 MG tablet TAKE TWO TABLETS BY MOUTH AT BEDTIME FOR SLEEP. (YOU MAY TAKE A 3RD TABLET IF NEEDED, IF YOU WAKE UP DURING THE NIGHT)       07/20/2022   11:44 AM 05/26/2022    8:28 AM 05/20/2021    9:51 AM 04/01/2020    9:07  AM  GAD 7 : Generalized Anxiety Score  Nervous, Anxious, on Edge 1 1 0 1  Control/stop worrying 1 1 0 2  Worry too much - different things 1 1 0 2  Trouble relaxing 0 2 0 2  Restless 2 2 0 1  Easily annoyed or irritable 2 2 0 2  Afraid - awful might happen 1 1 0 1  Total GAD 7 Score 8 10 0 11  Anxiety Difficulty Not difficult at all Very difficult  Not difficult at all       07/20/2022   11:44 AM 05/26/2022    8:28 AM 12/20/2021   10:35 AM  Depression screen PHQ 2/9  Decreased Interest '1 1 1  '$ Down, Depressed, Hopeless '1 2 1  '$ PHQ - 2 Score '2 3 2  '$ Altered sleeping 0 3 2  Tired, decreased energy 0 2 2  Change in appetite 0 1 0  Feeling bad or failure about yourself  0 2 0  Trouble concentrating '1 1 1  '$ Moving slowly or fidgety/restless 0 0 1  Suicidal thoughts 0 2 0  PHQ-9 Score '3 14 8  '$ Difficult doing work/chores Not difficult at all Somewhat difficult Not difficult at all    BP Readings from Last 3 Encounters:  06/17/22 113/62  05/26/22 118/66  05/11/22 (!) 143/69    Physical Exam Constitutional:      Appearance: He is ill-appearing.  Pulmonary:     Effort:  Pulmonary effort is normal.  Neurological:     General: No focal deficit present.     Mental Status: He is alert and oriented to person, place, and time.  Psychiatric:        Mood and Affect: Mood normal.     Wt Readings from Last 3 Encounters:  06/17/22 167 lb (75.8 kg)  05/26/22 163 lb (73.9 kg)  05/11/22 166 lb (75.3 kg)    Ht '5\' 11"'$  (1.803 m)   BMI 23.29 kg/m   Assessment and Plan: 1. COVID-19 virus infection Take Tylenol 650 - 1000 mg every 6-8 hours for fever, body aches and headache. Drink plenty of fluids with electrolytes. Monitor for fever that does not decrease and/or shortness of breath that worsens or is present at rest. Quarantine for 5 days.  - molnupiravir EUA (LAGEVRIO) 200 mg CAPS capsule; Take 4 capsules (800 mg total) by mouth 2 (two) times daily for 5 days.  Dispense: 40 capsule; Refill: 0 - promethazine-dextromethorphan (PROMETHAZINE-DM) 6.25-15 MG/5ML syrup; Take 5 mLs by mouth 4 (four) times daily as needed for up to 9 days for cough.  Dispense: 118 mL; Refill: 0  I spent 6 minutes on this encounter, 100% by video encounter.  Partially dictated using Editor, commissioning. Any errors are unintentional.  Halina Maidens, MD [facility]  07/20/2022

## 2022-07-20 NOTE — Patient Instructions (Signed)
Take Tylenol 650 - 1000 mg every 6-8 hours for fever, body aches and headache. Drink plenty of fluids with electrolytes. Monitor for fever that does not decrease and/or shortness of breath that worsens or is present at rest. Quarantine for 5 days.  

## 2022-08-04 NOTE — Progress Notes (Unsigned)
08/05/2022 9:39 AM   Timothy Scott July 21, 1946 [id number]  Referring provider: Glean Hess, MD [address] [place],  [place] [address]  Urological history: 1. BPH w/ LU TS -PSA (2022) 0.36 -cysto (2019) -  Enlarged prostate Visually obstructive ~5 cm in length - Mild trabeculation with small saccules in the posterior well - Retroflexion shows intravesical lobe noted -cysto (2021) - Marked lateral lobe enlargement of prostate gland, hypervascular - Moderate bladder neck elevation - moderate trabeculation with cellules - Retroflexion shows intravesical median lobe.  -I PSS 10/3 -PVR 21 mL  -had been on tamsulosin 0.4 mg and finasteride 5 mg daily in the past, has not taken in two years  2. High risk hematuria -former smoker -CTU (2019) - Mild prostate gland enlargement with mild circumferential wall thickening of the urinary bladder. -RUS (2021) - No acute or focal renal abnormality identified. Right extrarenal pelvis again noted.  No bladder distention. Bladder wall is again noted to be diffusely thickened at 0.7 cm. Prostate is prominent and irregular  -cysto (2019) -  Enlarged prostate Visually obstructive ~5 cm in length - Mild trabeculation with small saccules in the posterior well - Retroflexion shows intravesical lobe noted -cysto (2021) - Marked lateral lobe enlargement of prostate gland, hypervascular - Moderate bladder neck elevation - moderate trabeculation with cellules - Retroflexion shows intravesical median lobe.  -no reports of gross heme -UA 3-10 RBC's   3.  Incomplete bladder emptying -previous PVR's ~ 200 cc  -PVR 21 mL   Chief Complaint  Patient presents with   Follow-up    Follow up ct scan    HPI: Timothy Scott is a 76 y.o. male who presents today for CT urogram results.  At his visit on 06/18/2022, he was getting up several times a night for the last several months.  He had stopped his tamsulosin and finasteride approximately 2  years ago.  He was a [place] veteran and had a possible history of exposure to agent orange.  Per patient, he has a remote history of nephrolithiasis.  UA yellow clear, SG 1.025, pH 6.0, trace leukocytes, trace RBC, 0-5  WBC's, 3-10 RBC's and 0-10 epithelial cells.  Urine culture was negative.    At that appointment, he was restarted on his tamsulosin 0.4 mg daily and his finasteride 5 mg daily.  He also underwent a CT urogram for further investigation into his micro heme.  CT Urogram (07/05/2022) -bladder wall was thickened with numerous diverticula no prostatic megaly with hypertrophy of the median lobe.  Prostate ~ 60 cc.      IPSS     Row Name 08/05/22 0900         International Prostate Symptom Score   How often have you had the sensation of not emptying your bladder? Less than half the time     How often have you had to urinate less than every two hours? Less than half the time     How often have you found you stopped and started again several times when you urinated? Less than half the time     How often have you found it difficult to postpone urination? Less than 1 in 5 times     How often have you had a weak urinary stream? Less than 1 in 5 times     How often have you had to strain to start urination? Less than 1 in 5 times     How many times did  you typically get up at night to urinate? 1 Time     Total IPSS Score 10       Quality of Life due to urinary symptoms   If you were to spend the rest of your life with your urinary condition just the way it is now how would you feel about that? Mixed               Score:  1-7 Mild 8-19 Moderate 20-35 Severe    PMH: Past Medical History:  Diagnosis Date   Benign neoplasm of colon    Chronic airway obstruction, not elsewhere classified    DDD (degenerative disc disease), cervical    Degeneration of cervical intervertebral disc    Dysphagia    Dysuria    Hyperlipidemia    Intervertebral disc disorder of lumbar  region with myelopathy    Lattice degeneration of peripheral retina    Leg varices 03/10/2015   Osteoarthrosis, unspecified whether generalized or localized, hand    Polyp of transverse colon    Posttraumatic stress disorder    Sinus bradycardia 04/17/2014   Varicose veins of bilateral lower extremities with other complications    Wears dentures    partial upper and lower    Surgical History: Past Surgical History:  Procedure Laterality Date   CATARACT EXTRACTION Right 2015   CATARACT EXTRACTION W/PHACO Left 10/21/2015   Procedure: CATARACT EXTRACTION PHACO AND INTRAOCULAR LENS PLACEMENT ([place]);  Surgeon: Leandrew Koyanagi, MD;  Location: [facility];  Service: Ophthalmology;  Laterality: Left;   CATARACT EXTRACTION, BILATERAL  2016   COLONOSCOPY  2014   benign polyps   COLONOSCOPY WITH PROPOFOL N/A 03/10/2020   Procedure: COLONOSCOPY WITH PROPOFOL;  Surgeon: [name], MD;  Location: [facility] ENDOSCOPY;  Service: Endoscopy;  Laterality: N/A;  PRIORITY 4   COLONOSCOPY WITH PROPOFOL N/A 04/26/2022   Procedure: COLONOSCOPY WITH PROPOFOL;  Surgeon: [name], MD;  Location: [facility] ENDOSCOPY;  Service: Endoscopy;  Laterality: N/A;   CYSTOSCOPY     ESOPHAGEAL MANOMETRY N/A 08/03/2016   Procedure: ESOPHAGEAL MANOMETRY (EM);  Surgeon: Mauri Pole, MD;  Location: [facility] ENDOSCOPY;  Service: Endoscopy;  Laterality: N/A;   ESOPHAGOGASTRODUODENOSCOPY     ESOPHAGOGASTRODUODENOSCOPY N/A 04/26/2022   Procedure: ESOPHAGOGASTRODUODENOSCOPY (EGD);  Surgeon: [name], MD;  Location: [facility] ENDOSCOPY;  Service: Endoscopy;  Laterality: N/A;   ESOPHAGOGASTRODUODENOSCOPY (EGD) WITH PROPOFOL N/A 02/17/2016   with esoph dilatation   ESOPHAGOGASTRODUODENOSCOPY (EGD) WITH PROPOFOL N/A 03/10/2020   Procedure: ESOPHAGOGASTRODUODENOSCOPY (EGD) WITH PROPOFOL;  Surgeon: [name], MD;  Location: [facility] ENDOSCOPY;  Service: Endoscopy;  Laterality: N/A;   TONSILLECTOMY      Home Medications:  Allergies as  of 08/05/2022       Reactions   Prazosin    Other reaction(s): Dizziness        Medication List        Accurate as of August 05, 2022  9:39 AM. If you have any questions, ask your nurse or doctor.          CALCIUM 1200 PO Take 1 tablet by mouth daily.   cyanocobalamin 1000 MCG tablet Take by mouth.   DAILY HERBS PROSTATE PO Take 1 tablet by mouth daily.   glucosamine-chondroitin 500-400 MG tablet Take 1 tablet by mouth daily.   naproxen 500 MG tablet Commonly known as: NAPROSYN Take 500 mg by mouth 2 (two) times daily as needed for mild pain or moderate pain.   omeprazole 40 MG capsule Commonly known as: PRILOSEC  Take 40 mg by mouth daily.   pregabalin 25 MG capsule Commonly known as: LYRICA Take by mouth.   tamsulosin 0.4 MG Caps capsule Commonly known as: FLOMAX Take 0.4 mg by mouth as needed.   traZODone 100 MG tablet Commonly known as: DESYREL TAKE TWO TABLETS BY MOUTH AT BEDTIME FOR SLEEP. (YOU MAY TAKE A 3RD TABLET IF NEEDED, IF YOU WAKE UP DURING THE NIGHT)   ZINC PO Take by mouth daily.        Allergies:  Allergies  Allergen Reactions   Prazosin     Other reaction(s): Dizziness    Family History: Family History  Problem Relation Age of Onset   Hypertension Mother    Cancer Mother        breast   Aneurysm Mother    Healthy Sister    Prostate cancer Neg Hx    Kidney cancer Neg Hx    Bladder Cancer Neg Hx     Social History:  reports that he quit smoking about 36 years ago. His smoking use included cigarettes, pipe, and cigars. He has a 10.00 pack-year smoking history. He has never used smokeless tobacco. He reports current alcohol use of about 6.0 standard drinks of alcohol per week. He reports that he does not use drugs.  ROS: Pertinent ROS in HPI  Physical Exam: BP 112/65   Pulse 61   Ht '5\' 11"'$  (1.803 m)   Wt 167 lb (75.8 kg)   BMI 23.29 kg/m   Constitutional:  Well nourished. Alert and oriented, No acute  distress. HEENT: [place] AT, moist mucus membranes.  Trachea midline Cardiovascular: No clubbing, cyanosis, or edema. Respiratory: Normal respiratory effort, no increased work of breathing. Neurologic: Grossly intact, no focal deficits, moving all 4 extremities. Psychiatric: Normal mood and affect.   Laboratory Data: N/A   Pertinent Imaging: ***   Assessment & Plan:    1. BPH w/ LU TS -UA *** -PVR < 300 cc  -most bothersome symptoms are nocturia -continue conservative management, avoiding bladder irritants and timed voiding's -***  2. High risk hematuria -Former smoker -Work-up in 2021 -negative for malignant findings -CTU (06/2022) - prostatomegaly and bladder diverticula  -No reports of gross heme -UA ***   3. Nocturia -***    No follow-ups on file.  These notes generated with voice recognition software. I apologize for typographical errors.  [place], [place] [address]  [place], [place] [address] [phone]

## 2022-08-05 ENCOUNTER — Ambulatory Visit (INDEPENDENT_AMBULATORY_CARE_PROVIDER_SITE_OTHER): Payer: PPO | Admitting: Urology

## 2022-08-05 VITALS — BP 112/65 | HR 61 | Ht 71.0 in | Wt 167.0 lb

## 2022-08-05 DIAGNOSIS — R3129 Other microscopic hematuria: Secondary | ICD-10-CM

## 2022-08-05 DIAGNOSIS — N401 Enlarged prostate with lower urinary tract symptoms: Secondary | ICD-10-CM

## 2022-08-05 DIAGNOSIS — R351 Nocturia: Secondary | ICD-10-CM

## 2022-08-05 DIAGNOSIS — N138 Other obstructive and reflux uropathy: Secondary | ICD-10-CM

## 2022-08-05 LAB — URINALYSIS, COMPLETE
Bilirubin, UA: NEGATIVE
Glucose, UA: NEGATIVE
Ketones, UA: NEGATIVE
Nitrite, UA: NEGATIVE
Protein,UA: NEGATIVE
Specific Gravity, UA: 1.02 (ref 1.005–1.030)
Urobilinogen, Ur: 0.2 mg/dL (ref 0.2–1.0)
pH, UA: 6 (ref 5.0–7.5)

## 2022-08-05 LAB — MICROSCOPIC EXAMINATION

## 2022-08-05 LAB — BLADDER SCAN AMB NON-IMAGING: Scan Result: 21

## 2022-08-07 ENCOUNTER — Encounter: Payer: Self-pay | Admitting: Urology

## 2022-08-16 LAB — CULTURE, URINE COMPREHENSIVE

## 2022-08-22 ENCOUNTER — Encounter: Payer: Self-pay | Admitting: *Deleted

## 2022-09-19 NOTE — Progress Notes (Signed)
09/20/2022 11:14 AM   Timothy Scott 06-14-1946 [id number]  Referring provider: Glean Hess, MD [address] [place],  [place] [address]  Urological history: 1. BPH w/ LU TS -PSA (2022) 0.36 -cysto (2019) -  Enlarged prostate Visually obstructive ~5 cm in length - Mild trabeculation with small saccules in the posterior well - Retroflexion shows intravesical lobe noted -cysto (2021) - Marked lateral lobe enlargement of prostate gland, hypervascular - Moderate bladder neck elevation - moderate trabeculation with cellules - Retroflexion shows intravesical median lobe.  -prostate volume ~45 cc -I PSS 14/3 -had been on tamsulosin 0.4 mg and finasteride 5 mg daily in the past, has not taken in two years   2. High risk hematuria -former smoker -CTU (2019) - Mild prostate gland enlargement with mild circumferential wall thickening of the urinary bladder. -RUS (2021) - No acute or focal renal abnormality identified. Right extrarenal pelvis again noted.  No bladder distention. Bladder wall is again noted to be diffusely thickened at 0.7 cm. Prostate is prominent and irregular  -cysto (2019) -  Enlarged prostate Visually obstructive ~5 cm in length - Mild trabeculation with small saccules in the posterior well - Retroflexion shows intravesical lobe noted -cysto (2021) - Marked lateral lobe enlargement of prostate gland, hypervascular - Moderate bladder neck elevation - moderate trabeculation with cellules - Retroflexion shows intravesical median lobe.  -CTU (06/2022) - prostatomegaly and bladder wall thickening w/ numerous diverticula  -no reports of gross heme -UA negative for micro heme  3.  Incomplete bladder emptying -previous PVR's ~ 200 cc    HPI: Timothy Scott is a 77 y.o. male who presents today for one month follow up.    At his visit on 06/18/2022, he was getting up several times a night for the last several months.  He had stopped his tamsulosin and  finasteride approximately 2 years ago.  He was a [place] veteran and had a possible history of exposure to agent orange.  Per patient, he has a remote history of nephrolithiasis.  UA yellow clear, SG 1.025, pH 6.0, trace leukocytes, trace RBC, 0-5  WBC's, 3-10 RBC's and 0-10 epithelial cells.  Urine culture was negative.  At that appointment, he was restarted on his tamsulosin 0.4 mg daily and his finasteride 5 mg daily.  He also underwent a CT urogram for further investigation into his micro heme.  CT Urogram (07/05/2022) -bladder wall was thickened with numerous diverticula no prostatic megaly with hypertrophy of the median lobe.  Prostate ~ 60 cc.  He admitted that he has not started his tamsulosin and finasteride as we discussed at the last visit.  He continues to have low urinary symptoms of frequency, urgency, difficulty urinating and a weak urinary stream.  UA yellow clear, specific gravity 1.020, blood trace, pH 6.0, 1+ leukocyte, 6-10 WBCs, 3-10 RBCs, 0-10 epithelial cells and moderate bacteria.  PVR 21 mL  I PSS 14/3   UA yellow clear, specific gravity 1.020, trace blood, pH 6.0, +1 leukocyte, 6-10 WBCs, 0-2 RBCs, 0-10 epithelial cells and moderate bacteria.  He has been taking the tamsulosin and finasteride.  He feels like he has improved, but he circled "mixed."  He states he can "can live with it."  Patient denies any modifying or aggravating factors.  Patient denies any gross hematuria, dysuria or suprapubic/flank pain.  Patient denies any fevers, chills, nausea or vomiting.     IPSS     Row Name 09/20/22 0800  International Prostate Symptom Score   How often have you had the sensation of not emptying your bladder? Less than half the time     How often have you had to urinate less than every two hours? Less than half the time     How often have you found you stopped and started again several times when you urinated? Less than half the time     How often have you found it  difficult to postpone urination? About half the time     How often have you had a weak urinary stream? Less than 1 in 5 times     How often have you had to strain to start urination? Less than 1 in 5 times     How many times did you typically get up at night to urinate? 3 Times     Total IPSS Score 14       Quality of Life due to urinary symptoms   If you were to spend the rest of your life with your urinary condition just the way it is now how would you feel about that? Mixed                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    Lattice degeneration of peripheral retina    Leg varices 03/10/2015   Osteoarthrosis, unspecified whether generalized or localized, hand    Polyp of transverse colon    Posttraumatic stress disorder    Sinus bradycardia 04/17/2014   Varicose veins of bilateral lower extremities with other complications    Wears dentures    partial upper and lower    Surgical History: Past Surgical History:  Procedure Laterality Date   CATARACT EXTRACTION Right 2015   CATARACT EXTRACTION W/PHACO Left 10/21/2015   Procedure: CATARACT EXTRACTION PHACO AND INTRAOCULAR LENS PLACEMENT ([place]);  Surgeon: Leandrew Koyanagi, MD;  Location: [facility];  Service: Ophthalmology;  Laterality: Left;   CATARACT EXTRACTION, BILATERAL  2016   COLONOSCOPY  2014   benign polyps   COLONOSCOPY WITH PROPOFOL N/A 03/10/2020   Procedure: COLONOSCOPY WITH PROPOFOL;  Surgeon: [name], MD;  Location[facility] ENDOSCOPY;  Service: Endoscopy;  Laterality: N/A;  PRIORITY 4   COLONOSCOPY WITH PROPOFOL N/A 04/26/2022   Procedure: COLONOSCOPY WITH  PROPOFOL;  Surgeon: [name], MD;  Location: [facility] ENDOSCOPY;  Service: Endoscopy;  Laterality: N/A;   CYSTOSCOPY     ESOPHAGEAL MANOMETRY N/A 08/03/2016   Procedure: ESOPHAGEAL MANOMETRY (EM);  Surgeon: Mauri Pole, MD;  Location: [facility] ENDOSCOPY;  Service: Endoscopy;  Laterality: N/A;   ESOPHAGOGASTRODUODENOSCOPY     ESOPHAGOGASTRODUODENOSCOPY N/A 04/26/2022   Procedure: ESOPHAGOGASTRODUODENOSCOPY (EGD);  Surgeon: [name], MD;  Location: [facility] ENDOSCOPY;  Service: Endoscopy;  Laterality: N/A;   ESOPHAGOGASTRODUODENOSCOPY (EGD) WITH PROPOFOL N/A 02/17/2016   with esoph dilatation   ESOPHAGOGASTRODUODENOSCOPY (EGD) WITH PROPOFOL N/A 03/10/2020   Procedure: ESOPHAGOGASTRODUODENOSCOPY (EGD) WITH PROPOFOL;  Surgeon: [name], MD;  Location: [facility] ENDOSCOPY;  Service: Endoscopy;  Laterality: N/A;   TONSILLECTOMY      Home Medications:  Allergies as of 09/20/2022  Reactions   Prazosin    Other reaction(s): Dizziness        Medication List        Accurate as of September 20, 2022 11:59 PM. If you have any questions, ask your nurse or doctor.          busPIRone 15 MG tablet Commonly known as: BUSPAR Take Buspar 15 mg once daily for 1 week, then increase to 15 mg twice daily and continue this dose.   CALCIUM 1200 PO Take 1 tablet by mouth daily.   cyanocobalamin 1000 MCG tablet Take by mouth.   DAILY HERBS PROSTATE PO Take 1 tablet by mouth daily.   finasteride 5 MG tablet Commonly known as: PROSCAR TAKE ONE TABLET BY MOUTH EVERY DAY FOR BENIGN PROSTATIC HYPERPLASIA   glucosamine-chondroitin 500-400 MG tablet Take 1 tablet by mouth daily.   naproxen 500 MG tablet Commonly known as: NAPROSYN Take 500 mg by mouth 2 (two) times daily as needed for mild pain or moderate pain.   omeprazole 40 MG capsule Commonly known as: PRILOSEC Take 40 mg by mouth daily.   pregabalin 25 MG capsule Commonly known as: LYRICA Take by mouth.   tamsulosin 0.4 MG Caps  capsule Commonly known as: FLOMAX Take 0.4 mg by mouth as needed.   traZODone 100 MG tablet Commonly known as: DESYREL TAKE TWO TABLETS BY MOUTH AT BEDTIME FOR SLEEP. (YOU MAY TAKE A 3RD TABLET IF NEEDED, IF YOU WAKE UP DURING THE NIGHT)   ZINC PO Take by mouth daily.        Allergies:  Allergies  Allergen Reactions   Prazosin     Other reaction(s): Dizziness    Family History: Family History  Problem Relation Age of Onset   Hypertension Mother    Cancer Mother        breast   Aneurysm Mother    Healthy Sister    Prostate cancer Neg Hx    Kidney cancer Neg Hx    Bladder Cancer Neg Hx     Social History:  reports that he quit smoking about 36 years ago. His smoking use included cigarettes, pipe, and cigars. He has a 10.00 pack-year smoking history. He has never used smokeless tobacco. He reports current alcohol use of about 6.0 standard drinks of alcohol per week. He reports that he does not use drugs.  ROS: Pertinent ROS in HPI  Physical Exam: BP 124/63   Pulse (!) 50   Ht '5\' 11"'$  (1.803 m)   Wt 165 lb (74.8 kg)   BMI 23.01 kg/m   Constitutional:  Well nourished. Alert and oriented, No acute distress. HEENT: [place] AT, moist mucus membranes.  Trachea midline Cardiovascular: No clubbing, cyanosis, or edema. Respiratory: Normal respiratory effort, no increased work of breathing. Neurologic: Grossly intact, no focal deficits, moving all 4 extremities. Psychiatric: Normal mood and affect.   Laboratory Data: N/A     Pertinent Imaging: N/A    Assessment & Plan:    1. BPH w/ LU TS -most bothersome symptoms are nocturia -continue conservative management, avoiding bladder irritants and timed voiding's -we discussed undergoing a bladder outlet procedure again  2. High risk hematuria -Former smoker -Work-up in 2021 -negative for malignant findings -CTU (06/2022) - prostatomegaly and bladder diverticula  -No reports of gross heme -UA negative for micro  heme  3. Nocturia -We will reassess when he returns in 3 month   Return in about 3 months (around 12/20/2022) for I PSS, PVR .  These  notes generated with voice recognition software. I apologize for typographical errors.  [place], [place] [address]  [place], [place] [address] [phone]

## 2022-09-20 ENCOUNTER — Encounter: Payer: Self-pay | Admitting: Urology

## 2022-09-20 ENCOUNTER — Ambulatory Visit: Payer: PPO | Admitting: Urology

## 2022-09-20 VITALS — BP 124/63 | HR 50 | Ht 71.0 in | Wt 165.0 lb

## 2022-09-20 DIAGNOSIS — R3129 Other microscopic hematuria: Secondary | ICD-10-CM

## 2022-09-20 DIAGNOSIS — N401 Enlarged prostate with lower urinary tract symptoms: Secondary | ICD-10-CM

## 2022-09-20 DIAGNOSIS — N138 Other obstructive and reflux uropathy: Secondary | ICD-10-CM | POA: Diagnosis not present

## 2022-09-20 DIAGNOSIS — R351 Nocturia: Secondary | ICD-10-CM | POA: Diagnosis not present

## 2022-09-20 LAB — URINALYSIS, COMPLETE
Bilirubin, UA: NEGATIVE
Glucose, UA: NEGATIVE
Ketones, UA: NEGATIVE
Nitrite, UA: NEGATIVE
Protein,UA: NEGATIVE
Specific Gravity, UA: 1.02 (ref 1.005–1.030)
Urobilinogen, Ur: 0.2 mg/dL (ref 0.2–1.0)
pH, UA: 6 (ref 5.0–7.5)

## 2022-09-20 LAB — MICROSCOPIC EXAMINATION

## 2022-09-26 ENCOUNTER — Telehealth: Payer: Self-pay | Admitting: Urology

## 2022-09-26 NOTE — Telephone Encounter (Signed)
Notified patient as instructed, patient pleased. Discussed follow-up appointments, appt made

## 2022-09-26 NOTE — Telephone Encounter (Signed)
Left message for patient to return call.

## 2022-09-26 NOTE — Telephone Encounter (Signed)
Would you let Timothy Scott know that Dr. Bernardo Heater does not feel he needs a repeat cysto in the office at this time?  I would like to see him in three months because that is when the finasteride should be a maximum effect for I PSS and PVR.

## 2022-11-03 DIAGNOSIS — M1612 Unilateral primary osteoarthritis, left hip: Secondary | ICD-10-CM | POA: Diagnosis not present

## 2022-11-29 ENCOUNTER — Telehealth: Payer: Self-pay | Admitting: Internal Medicine

## 2022-11-29 NOTE — Telephone Encounter (Signed)
Copied from [place] [id number]. Topic: Medicare AWV >> Nov 29, 2022  9:42 AM Devoria Glassing wrote: Reason for CRM: Called patient to reschedule Medicare Annual Wellness Visit (AWV). Left message for patient to call back and reschedule 12/26/22 Medicare Annual Wellness Visit (AWV).  Last date of AWV: 12/20/21  Please schedule an appointment at any time with Kirke Shaggy, LPN   If any questions, please contact me.  Thank you ,  Sherol Dade; [place] Direct Dial: [phone]

## 2022-12-08 DIAGNOSIS — H538 Other visual disturbances: Secondary | ICD-10-CM | POA: Diagnosis not present

## 2022-12-08 DIAGNOSIS — R5383 Other fatigue: Secondary | ICD-10-CM | POA: Diagnosis not present

## 2022-12-08 DIAGNOSIS — R09A2 Foreign body sensation, throat: Secondary | ICD-10-CM | POA: Diagnosis not present

## 2022-12-08 DIAGNOSIS — R569 Unspecified convulsions: Secondary | ICD-10-CM | POA: Diagnosis not present

## 2022-12-08 DIAGNOSIS — R42 Dizziness and giddiness: Secondary | ICD-10-CM | POA: Diagnosis not present

## 2022-12-08 DIAGNOSIS — M25552 Pain in left hip: Secondary | ICD-10-CM | POA: Diagnosis not present

## 2022-12-26 NOTE — Progress Notes (Unsigned)
12/27/2022 9:32 AM   [name] 20-May-1946 [id number]  Referring provider: Reubin Milan, MD [address] [place],  [place] [address]  Urological history: 1. BPH w/ LU TS -PSA (2022) 0.36 -cysto (2019) -  Enlarged prostate Visually obstructive ~5 cm in length - Mild trabeculation with small saccules in the posterior well - Retroflexion shows intravesical lobe noted -cysto (2021) - Marked lateral lobe enlargement of prostate gland, hypervascular - Moderate bladder neck elevation - moderate trabeculation with cellules - Retroflexion shows intravesical median lobe.  -prostate volume ~45 cc  2. High risk hematuria -former smoker -CTU (2019) - Mild prostate gland enlargement with mild circumferential wall thickening of the urinary bladder. -RUS (2021) - No acute or focal renal abnormality identified. Right extrarenal pelvis again noted.  No bladder distention. Bladder wall is again noted to be diffusely thickened at 0.7 cm. Prostate is prominent and irregular  -cysto (2019) -  Enlarged prostate Visually obstructive ~5 cm in length - Mild trabeculation with small saccules in the posterior well - Retroflexion shows intravesical lobe noted -cysto (2021) - Marked lateral lobe enlargement of prostate gland, hypervascular - Moderate bladder neck elevation - moderate trabeculation with cellules - Retroflexion shows intravesical median lobe.  -CTU (06/2022) - prostatomegaly and bladder wall thickening w/ numerous diverticula   3.  Incomplete bladder emptying -previous PVR's ~ 200 cc    HPI: Timothy Scott is a 77 y.o. male who presents today for three month follow up after restarting tamsulosin and finasteride.  At his visit on 06/18/2022, he was getting up several times a night for the last several months.  He had stopped his tamsulosin and finasteride approximately 2 years ago.  He was a [place] veteran and had a possible history of exposure to agent orange.  Per patient,  he has a remote history of nephrolithiasis.  UA yellow clear, SG 1.025, pH 6.0, trace leukocytes, trace RBC, 0-5  WBC's, 3-10 RBC's and 0-10 epithelial cells.  Urine culture was negative.  At that appointment, he was restarted on his tamsulosin 0.4 mg daily and his finasteride 5 mg daily.  He also underwent a CT urogram for further investigation into his micro heme.  CT Urogram (07/05/2022) -bladder wall was thickened with numerous diverticula no prostatic megaly with hypertrophy of the median lobe.  Prostate ~ 60 cc.  He admitted that he has not started his tamsulosin and finasteride as we discussed at the last visit.  He continues to have low urinary symptoms of frequency, urgency, difficulty urinating and a weak urinary stream.  UA yellow clear, specific gravity 1.020, blood trace, pH 6.0, 1+ leukocyte, 6-10 WBCs, 3-10 RBCs, 0-10 epithelial cells and moderate bacteria.  PVR 21 mL  At his visit on 09/20/2022, I PSS 14/3.  UA yellow clear, specific gravity 1.020, trace blood, pH 6.0, +1 leukocyte, 6-10 WBCs, 0-2 RBCs, 0-10 epithelial cells and moderate bacteria.  He has been taking the tamsulosin and finasteride.  He feels like he has improved, but he circled "mixed."  He states he can "can live with it."  Patient denies any modifying or aggravating factors.  Patient denies any gross hematuria, dysuria or suprapubic/flank pain.  Patient denies any fevers, chills, nausea or vomiting.    He has seen neurology recently for issues of vertigo.  I PSS 11/2  He is having some urge incontinence, but it is not bothersome.  Patient denies any modifying or aggravating factors.  Patient denies any gross hematuria, dysuria  or suprapubic/flank pain.  Patient denies any fevers, chills, nausea or vomiting.     IPSS     Row Name 12/27/22 0800         International Prostate Symptom Score   How often have you had the sensation of not emptying your bladder? Less than half the time     How often have you had to  urinate less than every two hours? Less than half the time     How often have you found you stopped and started again several times when you urinated? Less than 1 in 5 times     How often have you found it difficult to postpone urination? Less than half the time     How often have you had a weak urinary stream? Less than 1 in 5 times     How often have you had to strain to start urination? Not at All     How many times did you typically get up at night to urinate? 3 Times     Total IPSS Score 11       Quality of Life due to urinary symptoms   If you were to spend the rest of your life with your urinary condition just the way it is now how would you feel about that? 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    Lattice degeneration of peripheral retina    Leg varices 03/10/2015   Osteoarthrosis, unspecified whether generalized or localized, hand    Polyp of transverse colon    Posttraumatic stress disorder    Sinus bradycardia 04/17/2014   Varicose veins of bilateral lower extremities with other complications    Wears dentures    partial upper and lower    Surgical History: Past Surgical History:  Procedure Laterality Date   CATARACT EXTRACTION Right 2015   CATARACT EXTRACTION W/PHACO Left 10/21/2015   Procedure: CATARACT EXTRACTION PHACO AND INTRAOCULAR LENS PLACEMENT (IOC);  Surgeon: Lockie Mola, MD;  Location: [facility];  Service: Ophthalmology;  Laterality: Left;   CATARACT EXTRACTION, BILATERAL  2016   COLONOSCOPY  2014   benign polyps   COLONOSCOPY WITH PROPOFOL N/A 03/10/2020    Procedure: COLONOSCOPY WITH PROPOFOL;  Surgeon: Midge Minium, MD;  Location: [facility] END[facility]COPY;  Service: Endoscopy;  Laterality: N/A;  PRIORITY 4   COLONOSCOPY WITH PROPOFOL N/A 04/26/2022   Procedure: COLONOSCOPY WITH PROPOFOL;  Surgeon: Midge Minium, MD;  Location: [facility] ENDOSCOPY;  Service: Endoscopy;  Laterality: N/A;   CYSTOSCOPY     ESOPHAGEAL MANOMETRY N/A 08/03/2016   Procedure: ESOPHAGEAL MANOMETRY (EM);  Surgeon: Napoleon Form, MD;  Location: [facility] ENDOSCOPY;  Service: Endoscopy;  Laterality: N/A;   ESOPHAGOGASTRODUODENOSCOPY     ESOPHAGOGASTRODUODENOSCOPY N/A 04/26/2022   Procedure: ESOPHAGOGASTRODUODENOSCOPY (EGD);  Surgeon: Midge Minium, MD;  Location: [facility] ENDOSCOPY;  Service: Endoscopy;  Laterality: N/A;   ESOPHAGOGASTRODUODENOSCOPY (EGD) WITH PROPOFOL N/A 02/17/2016   with esoph dilatation   ESOPHAGOGASTRODUODENOSCOPY (EGD) WITH PROPOFOL N/A 03/10/2020   Procedure: ESOPHAGOGASTRODUODENOSCOPY (EGD) WITH PROPOFOL;  Surgeon: Midge Minium, MD;  Location: [facility] ENDOSCOPY;  Service: Endoscopy;  Laterality: N/A;   TONSILLECTOMY      Home Medications:  Allergies as of 12/27/2022       Reactions   Prazosin    Other reaction(s): Dizziness        Medication List        Accurate as of December 27, 2022  9:32 AM. If you have any questions, ask your nurse or doctor.          busPIRone 15 MG tablet Commonly known as: BUSPAR Take Buspar 15 mg once daily for 1 week, then increase to 15 mg twice daily and continue this dose.   CALCIUM 1200 PO Take 1 tablet by mouth daily.   cyanocobalamin 1000 MCG tablet Take by mouth.   DAILY HERBS PROSTATE PO Take 1 tablet by mouth daily.   finasteride 5 MG tablet Commonly known as: PROSCAR TAKE ONE TABLET BY MOUTH EVERY DAY FOR BENIGN PROSTATIC HYPERPLASIA   glucosamine-chondroitin 500-400 MG tablet Take 1 tablet by mouth daily.   naproxen 500 MG tablet Commonly known as: NAPROSYN Take 500 mg by mouth 2 (two) times daily as needed for  mild pain or moderate pain.   omeprazole 40 MG capsule Commonly known as: PRILOSEC Take 40 mg by mouth daily.   pregabalin 25 MG capsule Commonly known as: LYRICA Take by mouth.   tamsulosin 0.4 MG Caps capsule Commonly known as: FLOMAX Take 0.4 mg by mouth as needed.   traZODone 100 MG tablet Commonly known as: DESYREL TAKE TWO TABLETS BY MOUTH AT BEDTIME FOR SLEEP. (YOU MAY TAKE A 3RD TABLET IF NEEDED, IF YOU WAKE UP DURING THE NIGHT)   ZINC PO Take by mouth daily.        Allergies:  Allergies  Allergen Reactions   Prazosin     Other reaction(s): Dizziness    Family History: Family History  Problem Relation Age of Onset   Hypertension Mother    Cancer Mother        breast   Aneurysm Mother    Healthy Sister    Prostate cancer Neg Hx    Kidney cancer Neg Hx    Bladder Cancer Neg Hx     Social History:  reports that he quit smoking about 36 years ago. His smoking use included cigarettes, pipe, and cigars. He has a 10.00 pack-year smoking history. He has never used smokeless tobacco. He reports current alcohol use of about 6.0 standard drinks of alcohol per week. He reports that he does not use drugs.  ROS: Pertinent ROS in HPI  Physical Exam: BP 128/74   Pulse 78   Ht 5\' 11"  (1.803 m)   Wt 167 lb (75.8 kg)   BMI 23.29 kg/m   Constitutional:  Well nourished. Alert and oriented, No acute distress. HEENT: [place] AT, moist mucus membranes.  Trachea midline Cardiovascular: No clubbing, cyanosis, or edema. Respiratory: Normal respiratory effort, no increased work of breathing. Neurologic: Grossly intact, no focal deficits, moving all 4 extremities. Psychiatric: Normal mood and affect.   Laboratory Data: N/A     Pertinent Imaging: N/A    Assessment & Plan:    1. BPH w/ LU TS -most bothersome symptoms are nocturia -continue conservative management, avoiding bladder irritants and timed voiding's -at goal with tamsulosin and finasteride  2. High risk  hematuria -Former smoker -Work-up in 2021 -negative for malignant findings -CTU (06/2022) - prostatomegaly and bladder diverticula  -No reports of gross heme  3. Nocturia -not bothersome at this time   Return for I PSS, PVR and UA .  These notes generated with voice recognition software. I apologize for typographical errors.  Cloretta Ned  [facility] [address]  [address] [place], [place] [address] [phone]

## 2022-12-27 ENCOUNTER — Ambulatory Visit: Payer: PPO | Admitting: Urology

## 2022-12-27 ENCOUNTER — Encounter: Payer: Self-pay | Admitting: Urology

## 2022-12-27 VITALS — BP 128/74 | HR 78 | Ht 71.0 in | Wt 167.0 lb

## 2022-12-27 DIAGNOSIS — N401 Enlarged prostate with lower urinary tract symptoms: Secondary | ICD-10-CM

## 2022-12-27 DIAGNOSIS — R319 Hematuria, unspecified: Secondary | ICD-10-CM

## 2022-12-27 DIAGNOSIS — R351 Nocturia: Secondary | ICD-10-CM | POA: Diagnosis not present

## 2022-12-27 DIAGNOSIS — N138 Other obstructive and reflux uropathy: Secondary | ICD-10-CM

## 2023-01-03 ENCOUNTER — Telehealth: Payer: Self-pay | Admitting: Internal Medicine

## 2023-01-03 NOTE — Telephone Encounter (Signed)
Copied from CRM [id number]. Topic: Medicare AWV >> Jan 03, 2023  2:35 PM Payton Doughty wrote: Reason for CRM: Called patient to schedule Medicare Annual Wellness Visit (AWV). Left message for patient to call back and schedule Medicare Annual Wellness Visit (AWV).  Last date of AWV: 12/20/2021   Please schedule an appointment at any time with Kennedy Bucker, LPN  .  If any questions, please contact me.  Thank you ,  Verlee Rossetti; Care Guide Ambulatory Clinical Support [place] [facility] Direct Dial: [phone]

## 2023-02-27 DIAGNOSIS — H43813 Vitreous degeneration, bilateral: Secondary | ICD-10-CM | POA: Diagnosis not present

## 2023-02-27 DIAGNOSIS — Z961 Presence of intraocular lens: Secondary | ICD-10-CM | POA: Diagnosis not present

## 2023-02-27 DIAGNOSIS — D3131 Benign neoplasm of right choroid: Secondary | ICD-10-CM | POA: Diagnosis not present

## 2023-02-27 DIAGNOSIS — H35371 Puckering of macula, right eye: Secondary | ICD-10-CM | POA: Diagnosis not present

## 2023-03-02 ENCOUNTER — Ambulatory Visit: Payer: Self-pay

## 2023-03-02 NOTE — Telephone Encounter (Signed)
Message from Randol Kern sent at 03/02/2023  1:32 PM EDT  Summary: Fatigued, seeking appt   Pt wants an appt to be seen, he feels fatigued and has no energy. He knows this is unusual and would like an appt. Please advise  Best contact: [phone]         Chief Complaint: weakness and fatigue Symptoms: occasional lightheadedness that comes and goes, sneezing, h/o vertigo and left hip pain Frequency: 3 weeks  Pertinent Negatives: Patient denies chest pain, fever, cough, SOB, vomiting, diarrhea, bleeding Disposition: [] ED /[] Urgent Care (no appt availability in office) / [] Appointment(In office/virtual)/ []  [place] [facility]/ [] Home Care/ [] Refused Recommended Disposition /[] [facility]/ []  Follow-up with PCP Additional Notes: no appts with PCP- called office and spoke to [place] - advised of assessment. Sending triage note and pt informed and verbalized understanding.   Reason for Disposition  [1] MODERATE weakness (i.e., interferes with work, school, normal activities) AND [2] persists > 3 days  Answer Assessment - Initial Assessment Questions 1. DESCRIPTION: "Describe how you are feeling."     Runs out of energy  feels like something has zapped out the energy  2. SEVERITY: "How bad is it?"  "Can you stand and walk?"   - MILD (0-3): Feels weak or tired, but does not interfere with work, school or normal activities.   - MODERATE (4-7): Able to stand and walk; weakness interferes with work, school, or normal activities.   - SEVERE (8-10): Unable to stand or walk; unable to do usual activities.     moderate 3. ONSET: "When did these symptoms begin?" (e.g., hours, days, weeks, months)     Several weeks  4. CAUSE: "What do you think is causing the weakness or fatigue?" (e.g., not drinking enough fluids, medical problem, trouble sleeping)     Unsure  5. NEW MEDICINES:  "Have you started on any new medicines recently?" (e.g., opioid pain medicines, benzodiazepines,  muscle relaxants, antidepressants, antihistamines, neuroleptics, beta blockers)     no 6. OTHER SYMPTOMS: "Do you have any other symptoms?" (e.g., chest pain, fever, cough, SOB, vomiting, diarrhea, bleeding, other areas of pain)     Lightheadedness several times a week last 10- 15 minutes, eye swelling- sneezing  Protocols used: Weakness (Generalized) and Fatigue-A-AH

## 2023-03-03 NOTE — Telephone Encounter (Signed)
Please call pt to schedule an appt.  KP

## 2023-03-06 ENCOUNTER — Other Ambulatory Visit
Admission: RE | Admit: 2023-03-06 | Discharge: 2023-03-06 | Disposition: A | Discharge status: Home / Self Care | Payer: PPO | Attending: Internal Medicine | Admitting: Internal Medicine

## 2023-03-06 ENCOUNTER — Ambulatory Visit (INDEPENDENT_AMBULATORY_CARE_PROVIDER_SITE_OTHER): Payer: PPO | Admitting: Internal Medicine

## 2023-03-06 ENCOUNTER — Encounter: Payer: Self-pay | Admitting: Internal Medicine

## 2023-03-06 VITALS — BP 102/64 | HR 52 | Ht 71.0 in | Wt 165.2 lb

## 2023-03-06 DIAGNOSIS — F4310 Post-traumatic stress disorder, unspecified: Secondary | ICD-10-CM | POA: Diagnosis not present

## 2023-03-06 DIAGNOSIS — I6522 Occlusion and stenosis of left carotid artery: Secondary | ICD-10-CM

## 2023-03-06 DIAGNOSIS — I495 Sick sinus syndrome: Secondary | ICD-10-CM

## 2023-03-06 DIAGNOSIS — J449 Chronic obstructive pulmonary disease, unspecified: Secondary | ICD-10-CM | POA: Diagnosis not present

## 2023-03-06 LAB — CBC WITH DIFFERENTIAL/PLATELET
Abs Immature Granulocytes: 0.02 10*3/uL (ref 0.00–0.07)
Basophils Absolute: 0.1 10*3/uL (ref 0.0–0.1)
Basophils Relative: 1 %
Eosinophils Absolute: 0.1 10*3/uL (ref 0.0–0.5)
Eosinophils Relative: 1 %
HCT: 39.2 % (ref 39.0–52.0)
Hemoglobin: 13.6 g/dL (ref 13.0–17.0)
Immature Granulocytes: 0 %
Lymphocytes Relative: 11 %
Lymphs Abs: 1 10*3/uL (ref 0.7–4.0)
MCH: 33.6 pg (ref 26.0–34.0)
MCHC: 34.7 g/dL (ref 30.0–36.0)
MCV: 96.8 fL (ref 80.0–100.0)
Monocytes Absolute: 0.9 10*3/uL (ref 0.1–1.0)
Monocytes Relative: 9 %
Neutro Abs: 7.4 10*3/uL (ref 1.7–7.7)
Neutrophils Relative %: 78 %
Platelets: 251 10*3/uL (ref 150–400)
RBC: 4.05 MIL/uL — ABNORMAL LOW (ref 4.22–5.81)
RDW: 12.3 % (ref 11.5–15.5)
WBC: 9.4 10*3/uL (ref 4.0–10.5)
nRBC: 0 % (ref 0.0–0.2)

## 2023-03-06 LAB — COMPREHENSIVE METABOLIC PANEL
ALT: 19 U/L (ref 0–44)
AST: 20 U/L (ref 15–41)
Albumin: 4.2 g/dL (ref 3.5–5.0)
Alkaline Phosphatase: 38 U/L (ref 38–126)
Anion gap: 5 (ref 5–15)
BUN: 21 mg/dL (ref 8–23)
CO2: 26 mmol/L (ref 22–32)
Calcium: 8.9 mg/dL (ref 8.9–10.3)
Chloride: 106 mmol/L (ref 98–111)
Creatinine, Ser: 0.84 mg/dL (ref 0.61–1.24)
GFR, Estimated: >60 mL/min (ref >60)
Glucose, Bld: 100 mg/dL — ABNORMAL HIGH (ref 70–99)
Potassium: 4.1 mmol/L (ref 3.5–5.1)
Sodium: 137 mmol/L (ref 135–145)
Total Bilirubin: 0.8 mg/dL (ref 0.3–1.2)
Total Protein: 6.6 g/dL (ref 6.5–8.1)

## 2023-03-06 LAB — TSH: TSH: 2.247 u[IU]/mL (ref 0.350–4.500)

## 2023-03-06 NOTE — Progress Notes (Signed)
Date:  03/06/2023   Name:  Timothy Scott   DOB:  [birth year]   MRN:  [id number]   Chief Complaint: Fatigue (Started a few weeks ago. Not sleeping well. Trazodone only helps slightly.)  Insomnia Primary symptoms: sleep disturbance, premature morning awakening.   The onset quality is undetermined. The problem occurs nightly. Treatments tried: taking 150 mg Trazodone.  SSS - has not seen Cardiology in some time.  No angina or shortness of breath.  Feels tired and sleepy but does not nap.   Hx of left carotid bruit and last [place] 2022 (unable to see result).  Not on any statin.   Lab Results  Component Value Date   NA 135 05/26/2022   K 4.0 05/26/2022   CO2 25 05/26/2022   GLUCOSE 104 (H) 05/26/2022   BUN 14 05/26/2022   CREATININE 0.79 05/26/2022   CALCIUM 8.6 (L) 05/26/2022   GFRNONAA >60 05/26/2022   Lab Results  Component Value Date   CHOL 185 05/26/2022   HDL 71 05/26/2022   LDLCALC 107 (H) 05/26/2022   TRIG 33 05/26/2022   CHOLHDL 2.6 05/26/2022   Lab Results  Component Value Date   TSH 1.50 04/13/2020   Lab Results  Component Value Date   HGBA1C 5.1 05/26/2022   Lab Results  Component Value Date   WBC 6.6 05/26/2022   HGB 14.6 05/26/2022   HCT 42.3 05/26/2022   MCV 96.1 05/26/2022   PLT 281 05/26/2022   Lab Results  Component Value Date   ALT 20 05/26/2022   AST 22 05/26/2022   ALKPHOS 41 05/26/2022   BILITOT 0.4 05/26/2022   Lab Results  Component Value Date   VD25OH 32 04/13/2020     Review of Systems  Constitutional:  Positive for fatigue. Negative for chills, fever and unexpected weight change.  HENT:  Positive for trouble swallowing.   Respiratory:  Negative for chest tightness, shortness of breath and wheezing.   Cardiovascular:  Negative for chest pain.  Gastrointestinal:  Negative for abdominal pain, constipation and diarrhea.  Musculoskeletal:  Negative for arthralgias and back pain.  Neurological:  Negative for dizziness,  light-headedness and headaches.  Psychiatric/Behavioral:  Positive for dysphoric mood and sleep disturbance. The patient is nervous/anxious and has insomnia.     Patient Active Problem List   Diagnosis Date Noted   Gastric intestinal metaplasia    Change in bowel habits    Leg pain, bilateral 07/22/2021   Sensory polyneuropathy 07/22/2021   Intestinal metaplasia of gastric mucosa 05/20/2021   Senile nuclear sclerosis 12/02/2020   Restless legs syndrome 12/02/2020   Osteoarthritis of hip 12/02/2020   Myopia 12/02/2020   Esophageal reflux 12/02/2020   Hearing loss 12/02/2020   Actinic keratosis 12/02/2020   Chronic obstructive lung disease (HCC) 12/02/2020   Chronic neck pain 12/02/2020   Other ill-defined and unknown causes of morbidity and mortality 12/02/2020   Neurocardiogenic pre-syncope 10/13/2020   Dietary B12 deficiency 05/14/2020   Headache disorder 04/13/2020   Other diseases of stomach and duodenum    Hx of transient ischemic attack (TIA) 01/28/2020   Elevated left ventricular end-diastolic pressure (LVEDP) 06/01/2019   Moderate aortic regurgitation 06/01/2019   Carotid artery plaque, left 07/09/2018   Sick sinus syndrome (HCC) 03/09/2017   Anosmia 05/05/2016   Dysphagia 02/03/2016   Tobacco use disorder, moderate, in sustained remission 03/11/2015   Lumbar disc herniation with radiculopathy 03/11/2015   Degeneration of intervertebral disc 03/10/2015   BPH with obstruction/lower urinary  tract symptoms 03/10/2015   Degenerative arthritis of finger 03/10/2015   Post-traumatic stress disorder 03/10/2015   Lattice degeneration 03/10/2015   Leg varices 03/10/2015    Allergies  Allergen Reactions   Prazosin     Other reaction(s): Dizziness    Past Surgical History:  Procedure Laterality Date   CATARACT EXTRACTION Right 2015   CATARACT EXTRACTION W/PHACO Left 10/21/2015   Procedure: CATARACT EXTRACTION PHACO AND INTRAOCULAR LENS PLACEMENT (IOC);  Surgeon: Lockie Mola, MD;  [facility];  Service: Ophthalmology;  Laterality: Left;   CATARACT EXTRACTION, BILATERAL  2016   COLONOSCOPY  2014   benign polyps   COLONOSCOPY WITH PROPOFOL N/A 03/10/2020   Procedure: COLONOSCOPY WITH PROPOFOL;  Surgeon: Midge Minium, MD;  Location[facility];  Service: Endoscopy;  Laterality: N/A;  PRIORITY 4   COLONOSCOPY WITH PROPOFOL N/A 04/26/2022   Procedure: COLONOSCOPY WITH PROPOFOL;  Surgeon: Midge Minium, MD;  Location: [facility];  Service: Endoscopy;  Laterality: N/A;   CYSTOSCOPY     ESOPHAGEAL MANOMETRY N/A 08/03/2016   Procedure: ESOPHAGEAL MANOMETRY (EM);  Surgeon: Napoleon Form, MD;  Location: [facility] ENDOSCOPY;  Service: Endoscopy;  Laterality: N/A;   ESOPHAGOGASTRODUODENOSCOPY     ESOPHAGOGASTRODUODENOSCOPY N/A 04/26/2022   Procedure: ESOPHAGOGASTRODUODENOSCOPY (EGD);  Surgeon: Midge Minium, MD;  Location: [facility] ENDOSCOPY;  Service: Endoscopy;  Laterality: N/A;   ESOPHAGOGASTRODUODENOSCOPY (EGD) WITH PROPOFOL N/A 02/17/2016   with esoph dilatation   ESOPHAGOGASTRODUODENOSCOPY (EGD) WITH PROPOFOL N/A 03/10/2020   Procedure: ESOPHAGOGASTRODUODENOSCOPY (EGD) WITH PROPOFOL;  Surgeon: Midge Minium, MD;  Location: [facility] ENDOSCOPY;  Service: Endoscopy;  Laterality: N/A;   TONSILLECTOMY      Social History   Tobacco Use   Smoking status: Former    Packs/day: 0.50    Years: 20.00    Additional pack years: 0.00    Total pack years: 10.00    Types: Cigarettes, Pipe, Cigars    Quit date: 04/05/1986    Years since quitting: 36.9   Smokeless tobacco: Never   Tobacco comments:    smoking cessation materials not required  Vaping Use   Vaping Use: Never used  Substance Use Topics   Alcohol use: Yes    Alcohol/week: 6.0 standard drinks of alcohol    Types: 6 Standard drinks or equivalent per week    Comment: socially   Drug use: No     Medication list has been reviewed and updated.  Current Meds  Medication Sig   Calcium  Carbonate-Vit D-Min (CALCIUM 1200 PO) Take 1 tablet by mouth daily.    cyanocobalamin 1000 MCG tablet Take by mouth.   finasteride (PROSCAR) 5 MG tablet TAKE ONE TABLET BY MOUTH EVERY DAY FOR BENIGN PROSTATIC HYPERPLASIA   glucosamine-chondroitin 500-400 MG tablet Take 1 tablet by mouth daily.   Misc Natural Products (DAILY HERBS PROSTATE PO) Take 1 tablet by mouth daily.   Multiple Vitamins-Minerals (ZINC PO) Take by mouth daily.   naproxen (NAPROSYN) 500 MG tablet Take 500 mg by mouth 2 (two) times daily as needed for mild pain or moderate pain.    omeprazole (PRILOSEC) 40 MG capsule Take 40 mg by mouth daily.    tamsulosin (FLOMAX) 0.4 MG CAPS capsule Take 0.4 mg by mouth as needed.   traZODone (DESYREL) 100 MG tablet TAKE TWO TABLETS BY MOUTH AT BEDTIME FOR SLEEP. (YOU MAY TAKE A 3RD TABLET IF NEEDED, IF YOU WAKE UP DURING THE NIGHT)   [DISCONTINUED] busPIRone (BUSPAR) 15 MG tablet Take Buspar 15 mg once daily for  1 week, then increase to 15 mg twice daily and continue this dose.   [DISCONTINUED] pregabalin (LYRICA) 25 MG capsule Take by mouth.       03/06/2023    9:58 AM 07/20/2022   11:44 AM 05/26/2022    8:28 AM 05/20/2021    9:51 AM  GAD 7 : Generalized Anxiety Score  Nervous, Anxious, on Edge 1 1 1  0  Control/stop worrying 1 1 1  0  Worry too much - different things 0 1 1 0  Trouble relaxing 1 0 2 0  Restless 2 2 2  0  Easily annoyed or irritable 2 2 2  0  Afraid - awful might happen 1 1 1  0  Total GAD 7 Score 8 8 10  0  Anxiety Difficulty Somewhat difficult Not difficult at all Very difficult        03/06/2023    9:58 AM 07/20/2022   11:44 AM 05/26/2022    8:28 AM  Depression screen PHQ 2/9  Decreased Interest 1 1 1   Down, Depressed, Hopeless 1 1 2   PHQ - 2 Score 2 2 3   Altered sleeping 2 0 3  Tired, decreased energy 3 0 2  Change in appetite 3 0 1  Feeling bad or failure about yourself  1 0 2  Trouble concentrating 2 1 1   Moving slowly or fidgety/restless 1 0 0   Suicidal thoughts 0 0 2  PHQ-9 Score 14 3 14   Difficult doing work/chores Somewhat difficult Not difficult at all Somewhat difficult    BP Readings from Last 3 Encounters:  03/06/23 102/64  12/27/22 128/74  09/20/22 124/63    Physical Exam Vitals and nursing note reviewed.  Constitutional:      General: He is not in acute distress.    Appearance: Normal appearance. He is well-developed.  HENT:     Head: Normocephalic and atraumatic.  Neck:     Vascular: Carotid bruit present.  Cardiovascular:     Rate and Rhythm: Regular rhythm. Bradycardia present.     Heart sounds: No murmur heard. Pulmonary:     Effort: Pulmonary effort is normal. No respiratory distress.     Breath sounds: No wheezing or rhonchi.  Abdominal:     General: Abdomen is flat.     Palpations: Abdomen is soft.     Tenderness: There is no abdominal tenderness.  Musculoskeletal:     Cervical back: Normal range of motion.     Right lower leg: No edema.     Left lower leg: No edema.  Lymphadenopathy:     Cervical: No cervical adenopathy.  Skin:    General: Skin is warm and dry.     Findings: No rash.  Neurological:     General: No focal deficit present.     Mental Status: He is alert and oriented to person, place, and time.  Psychiatric:        Mood and Affect: Mood normal.        Behavior: Behavior normal.     Wt Readings from Last 3 Encounters:  03/06/23 165 lb 3.2 oz (74.9 kg)  12/27/22 167 lb (75.8 kg)  09/20/22 165 lb (74.8 kg)    BP 102/64   Pulse (!) 52   Ht 5\' 11"  (1.803 m)   Wt 165 lb 3.2 oz (74.9 kg)   SpO2 96%   BMI 23.04 kg/m   Assessment and Plan:  Problem List Items Addressed This Visit     Sick sinus syndrome (HCC)  Has not seen Cardiology in some time May consider follow up to discuss options such as pacemaker which may reduce his fatigue symptoms       Relevant Orders   EKG 12-Lead (Completed)   CBC with Differential/Platelet   Comprehensive metabolic panel    TSH   Post-traumatic stress disorder - Primary (Chronic)    On high dose trazodone - may be causing his daytime fatigue Recommend reducing the dose to 100 mg at bedtime and observe      Chronic obstructive lung disease (HCC) (Chronic)    Stable mild symptoms on PRN albuterol      Carotid artery plaque, left (Chronic)   Relevant Orders   US Carotid Duplex Bilateral    No follow-ups on file.   Partially dictated using Dragon software, any errors are not intentional.  Reubin Milan, MD [facility] [place], [place]

## 2023-03-06 NOTE — Assessment & Plan Note (Signed)
Has not seen Cardiology in some time May consider follow up to discuss options such as pacemaker which may reduce his fatigue symptoms

## 2023-03-06 NOTE — Assessment & Plan Note (Signed)
Stable mild symptoms on PRN albuterol

## 2023-03-06 NOTE — Assessment & Plan Note (Signed)
On high dose trazodone - may be causing his daytime fatigue Recommend reducing the dose to 100 mg at bedtime and observe

## 2023-03-14 ENCOUNTER — Ambulatory Visit
Admission: RE | Admit: 2023-03-14 | Discharge: 2023-03-14 | Disposition: A | Discharge status: Home / Self Care | Payer: PPO | Source: Ambulatory Visit | Attending: Internal Medicine | Admitting: Internal Medicine

## 2023-03-14 DIAGNOSIS — I6522 Occlusion and stenosis of left carotid artery: Secondary | ICD-10-CM | POA: Insufficient documentation

## 2023-03-14 DIAGNOSIS — I6523 Occlusion and stenosis of bilateral carotid arteries: Secondary | ICD-10-CM | POA: Diagnosis not present

## 2023-03-14 DIAGNOSIS — H539 Unspecified visual disturbance: Secondary | ICD-10-CM | POA: Diagnosis not present

## 2023-03-23 ENCOUNTER — Ambulatory Visit: Payer: PPO

## 2023-03-23 VITALS — Ht 71.0 in | Wt 165.0 lb

## 2023-03-23 DIAGNOSIS — Z0000 Encounter for general adult medical examination without abnormal findings: Secondary | ICD-10-CM

## 2023-03-23 NOTE — Progress Notes (Signed)
Subjective:   Timothy Scott is a 77 y.o. male who presents for Medicare Annual/Subsequent preventive examination.  Per patient no change in vitals since last visit, unable to obtain new vitals due to telehealth visit   Visit Complete: Virtual  I connected with  Timothy Scott on 03/23/23 by a audio enabled telemedicine application and verified that I am speaking with the correct person using two identifiers.  Patient Location: Home  Provider Location: Office/Clinic  I discussed the limitations of evaluation and management by telemedicine. The patient expressed understanding and agreed to proceed.  Review of Systems     Cardiac Risk Factors include: advanced age (>73men, >16 women);dyslipidemia;male gender;sedentary lifestyle     Objective:    Today's Vitals   03/23/23 0856 03/23/23 0910  Weight:  165 lb (74.8 kg)  Height:  5\' 11"  (1.803 m)  PainSc: 4     Body mass index is 23.01 kg/m.     03/23/2023    9:01 AM 04/26/2022    8:03 AM 12/20/2021   10:38 AM 12/02/2020   10:54 AM 03/10/2020   10:06 AM 05/31/2019    5:02 PM 05/31/2019    2:06 PM  Advanced Directives  Does Patient Have a Medical Advance Directive? No Yes Yes Yes Yes  Yes  Type of Special educational needs teacher of [place];Living will Healthcare Power of [place];Living will Healthcare Power of [place];Living will Healthcare Power of [place];Living will Healthcare Power of [place];Living will Living will  Does patient want to make changes to medical advance directive?       No - Patient declined  Copy of Healthcare Power of Attorney in Chart?  No - copy requested No - copy requested No - copy requested Yes - validated most recent copy scanned in chart (See row information) No - copy requested   Would patient like information on creating a medical advance directive? No - Patient declined          Current Medications (verified) Outpatient Encounter Medications as of 03/23/2023  Medication Sig   Calcium  Carbonate-Vit D-Min (CALCIUM 1200 PO) Take 1 tablet by mouth daily.    cyanocobalamin 1000 MCG tablet Take by mouth.   finasteride (PROSCAR) 5 MG tablet TAKE ONE TABLET BY MOUTH EVERY DAY FOR BENIGN PROSTATIC HYPERPLASIA   glucosamine-chondroitin 500-400 MG tablet Take 1 tablet by mouth daily.   Misc Natural Products (DAILY HERBS PROSTATE PO) Take 1 tablet by mouth daily.   Multiple Vitamins-Minerals (ZINC PO) Take by mouth daily.   naproxen (NAPROSYN) 500 MG tablet Take 500 mg by mouth 2 (two) times daily as needed for mild pain or moderate pain.    omeprazole (PRILOSEC) 40 MG capsule Take 40 mg by mouth daily.    tamsulosin (FLOMAX) 0.4 MG CAPS capsule Take 0.4 mg by mouth as needed.   traZODone (DESYREL) 100 MG tablet TAKE TWO TABLETS BY MOUTH AT BEDTIME FOR SLEEP. (YOU MAY TAKE A 3RD TABLET IF NEEDED, IF YOU WAKE UP DURING THE NIGHT)   No facility-administered encounter medications on file as of 03/23/2023.    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    Lattice  degeneration of peripheral retina    Leg varices 03/10/2015   Osteoarthrosis, unspecified whether generalized or localized, hand    Polyp of transverse colon    Posttraumatic stress disorder    Sinus bradycardia 04/17/2014   Varicose veins of bilateral lower extremities with other complications    Wears dentures    partial upper and lower   Past Surgical History:  Procedure Laterality Date   CATARACT EXTRACTION Right 2015   CATARACT EXTRACTION W/PHACO Left 10/21/2015   Procedure: CATARACT EXTRACTION PHACO AND INTRAOCULAR LENS PLACEMENT (IOC);  Surgeon: Lockie Mola, MD;  Location: [facility];  Service: Ophthalmology;  Laterality: Left;   CATARACT EXTRACTION, BILATERAL  2016   COLONOSCOPY  2014   benign polyps   COLONOSCOPY WITH PROPOFOL N/A 03/10/2020   Procedure: COLONOSCOPY WITH PROPOFOL;  Surgeon: Midge Minium, MD;  Location: [facility];  Service: Endoscopy;  Laterality: N/A;  PRIORITY 4   COLONOSCOPY WITH PROPOFOL N/A 04/26/2022   Procedure: COLONOSCOPY WITH PROPOFOL;  Surgeon: Midge Minium, MD;  Location: [facility] END[facility]COPY;  Service: Endoscopy;  Laterality: N/A;   CYSTOSCOPY     ESOPHAGEAL MANOMETRY N/A 08/03/2016   Procedure: ESOPHAGEAL MANOMETRY (EM);  Surgeon: Napoleon Form, MD;  Location: [facility];  Service: Endoscopy;  Laterality: N/A;   ESOPHAGOGASTRODUODENOSCOPY     ESOPHAGOGASTRODUODENOSCOPY N/A 04/26/2022   Procedure: ESOPHAGOGASTRODUODENOSCOPY (EGD);  Surgeon: Midge Minium, MD;  Location: [facility] END[facility]COPY;  Service: Endoscopy;  Laterality: N/A;   ESOPHAGOGASTRODUODENOSCOPY (EGD) WITH PROPOFOL N/A 02/17/2016   with esoph dilatation   ESOPHAGOGASTRODUODENOSCOPY (EGD) WITH PROPOFOL N/A 03/10/2020   Procedure: ESOPHAGOGASTRODUODENOSCOPY (EGD) WITH PROPOFOL;  Surgeon: Midge Minium, MD;  Location: [facility] ENDOSCOPY;  Service: Endoscopy;  Laterality: N/A;   TONSILLECTOMY     Family History  Problem Relation Age of Onset   Hypertension Mother    Cancer Mother        breast   Aneurysm Mother    Healthy Sister    Prostate cancer Neg Hx    Kidney cancer Neg Hx    Bladder Cancer Neg Hx    Social History   Socioeconomic History   Marital status: Divorced    Spouse name: Not on file   Number of children: 1   Years of education: some college   Highest education level: 12th grade  Occupational History   Occupation: Retired  Tobacco Use   Smoking status: Former    Current packs/day: 0.00    Average packs/day: 0.5 packs/day for 20.0 years (10.0 ttl pk-yrs)    Types: Cigarettes, Pipe, Cigars    Start date: 04/05/1966    Quit date: 04/05/1986    Years since  quitting: 36.9   Smokeless tobacco: Never   Tobacco comments:    smoking cessation materials not required  Vaping Use   Vaping status: Never Used  Substance and Sexual Activity   Alcohol use: Yes    Alcohol/week: 6.0 standard drinks of alcohol    Types: 6 Standard drinks or equivalent per week    Comment: socially   Drug use: No   Sexual activity: Not Currently  Other Topics Concern   Not on file  Social History Narrative   Pt lives alone   Social Determinants of Health   Financial Resource Strain: Low Risk  (03/23/2023)   Overall Financial Resource Strain (CARDIA)    Difficulty of Paying Living Expenses: Not hard at all  Food Insecurity: No Food Insecurity (03/23/2023)   Hunger Vital Sign    Worried About Running Out  of Food in the Last Year: Never true    Ran Out of Food in the Last Year: Never true  Transportation Needs: No Transportation Needs (03/23/2023)   PRAPARE - Administrator, Civil Service (Medical): No    Lack of Transportation (Non-Medical): No  Physical Activity: Inactive (03/23/2023)   Exercise Vital Sign    Days of Exercise per Week: 0 days    Minutes of Exercise per Session: 0 min  Stress: No Stress Concern Present (03/23/2023)   [employer] of Occupational Health - Occupational Stress Questionnaire    Feeling of Stress : Only a little  Social Connections: Socially Isolated (03/23/2023)   Social Connection and Isolation Panel [NHANES]    Frequency of Communication with Friends and Family: More than three times a week    Frequency of Social Gatherings with Friends and Family: Three times a week    Attends Religious Services: Never    Active Member of Clubs or Organizations: No    Attends Engineer, structural: Never    Marital Status: Divorced    Tobacco Counseling Counseling given: Not Answered Tobacco comments: smoking cessation materials not required   Clinical Intake:  Pre-visit preparation completed: Yes  Pain :  0-10 Pain Score: 4  Pain Type: Chronic pain Pain Location: Leg Pain Orientation: Left     Nutritional Risks: None Diabetes: No  How often do you need to have someone help you when you read instructions, pamphlets, or other written materials from your doctor or pharmacy?: 1 - Never  Interpreter Needed?: No  Information entered by :: Kennedy Bucker, LPN   Activities of Daily Living    03/23/2023    9:01 AM  In your present state of health, do you have any difficulty performing the following activities:  Hearing? 0  Vision? 0  Difficulty concentrating or making decisions? 0  Walking or climbing stairs? 1  Comment hip pain  Dressing or bathing? 0  Doing errands, shopping? 0  Preparing Food and eating ? N  Using the Toilet? N  In the past six months, have you accidently leaked urine? N  Do you have problems with loss of bowel control? N  Managing your Medications? N  Managing your Finances? N  Housekeeping or managing your Housekeeping? N    Patient Care Team: Reubin Milan, MD as PCP - General (Internal Medicine) Jesusita Oka, MD (Dermatology) Midge Minium, MD as Consulting Physician (Gastroenterology) Morene Crocker, MD as Referring Physician (Neurology)  Indicate any recent Medical Services you may have received from other than [facility] providers in the past year (date may be approximate).     Assessment:   This is a routine wellness examination for K[place].  Hearing/Vision screen Hearing Screening - Comments:: No aids Vision Screening - Comments:: No glasses- Dr.Brasington  Dietary issues and exercise activities discussed:     Goals Addressed             This Visit's Progress    DIET - EAT MORE FRUITS AND VEGETABLES         Depression Screen    03/23/2023    8:59 AM 03/06/2023    9:58 AM 07/20/2022   11:44 AM 05/26/2022    8:28 AM 12/20/2021   10:35 AM 05/20/2021    9:51 AM 12/02/2020   10:51 AM  PHQ 2/9 Scores  PHQ - 2 Score 2 2 2 3 2  0  3  PHQ- 9 Score 4 14 3 14 8  0 10  Fall Risk    03/23/2023    9:01 AM 03/06/2023    9:58 AM 07/20/2022   11:45 AM 05/26/2022    8:28 AM 12/20/2021   10:38 AM  Fall Risk   Falls in the past year? 1 1 0 0 0  Number falls in past yr: 0 0 0 0 0  Injury with Fall? 0 0 0 0 0  Risk for fall due to : History of fall(s) No Fall Risks No Fall Risks No Fall Risks No Fall Risks  Follow up Falls prevention discussed;Falls evaluation completed Falls evaluation completed Falls evaluation completed Falls evaluation completed Falls prevention discussed    MEDICARE RISK AT HOME:  Medicare Risk at Home - 03/23/23 0902     Any stairs in or around the home? No    If so, are there any without handrails? No    Home free of loose throw rugs in walkways, pet beds, electrical cords, etc? Yes    Adequate lighting in your home to reduce risk of falls? Yes    Life alert? No    Use of a cane, walker or w/c? No    Grab bars in the bathroom? No    Shower chair or bench in shower? No    Elevated toilet seat or a handicapped toilet? No             TIMED UP AND GO:  Was the test performed?  No    Cognitive Function:        03/23/2023    9:02 AM 12/02/2020   10:57 AM 04/15/2019   10:25 AM 03/21/2018    1:40 PM  6CIT Screen  What Year? 0 points 0 points 0 points 0 points  What month? 0 points 0 points 0 points 0 points  What time? 0 points 0 points 0 points 0 points  Count back from 20 0 points 0 points 0 points 0 points  Months in reverse 2 points 2 points 0 points 0 points  Repeat phrase 0 points 0 points 2 points 0 points  Total Score 2 points 2 points 2 points 0 points    Immunizations Immunization History  Administered Date(s) Administered   H1N1 10/06/2008   Influenza, High Dose Seasonal PF 06/11/2019   Influenza, Seasonal, Injecte, Preservative Fre 08/03/2009, 07/19/2013   Influenza,inj,Quad PF,6+ Mos 07/21/2017, 06/18/2018   Influenza-Unspecified 07/18/2002, 08/22/2003, 07/15/2004,  07/07/2005, 07/25/2006, 08/21/2007, 05/22/2008, 10/06/2008, 08/05/2010, 07/07/2011, 06/05/2012, 07/06/2014, 07/02/2015, 09/06/2015, 07/06/2016, 06/05/2017, 06/18/2018, 07/06/2018, 06/06/2019, 06/30/2020   Moderna Covid-19 Vaccine Bivalent Booster 38yrs & up 08/28/2021   Moderna Sars-Covid-2 Vaccination 11/04/2019, 12/05/2019, 07/14/2020, 12/29/2020   Pneumococcal Conjugate-13 01/28/2014, 05/28/2015   Pneumococcal Polysaccharide-23 03/30/2001, 09/06/2010, 09/19/2011   Tdap 09/06/2010, 09/19/2011   Zoster Recombinant(Shingrix) 08/07/2018, 10/12/2018   Zoster, Live 09/06/2010, 02/04/2011    TDAP status: Due, Education has been provided regarding the importance of this vaccine. Advised may receive this vaccine at local pharmacy or Health Dept. Aware to provide a copy of the vaccination record if obtained from local pharmacy or Health Dept. Verbalized acceptance and understanding.  Flu Vaccine status: Declined, Education has been provided regarding the importance of this vaccine but patient still declined. Advised may receive this vaccine at local pharmacy or Health Dept. Aware to provide a copy of the vaccination record if obtained from local pharmacy or Health Dept. Verbalized acceptance and understanding.  Pneumococcal vaccine status: Up to date  Covid-19 vaccine status: Completed vaccines  Qualifies for Shingles Vaccine? Yes   Zostavax completed  Yes   Shingrix Completed?: Yes  Screening Tests Health Maintenance  Topic Date Due   DTaP/Tdap/Td (3 - Td or Tdap) 09/18/2021   INFLUENZA VACCINE  04/06/2023   Medicare Annual Wellness (AWV)  03/22/2024   Colonoscopy  04/27/2027   Pneumonia Vaccine 9+ Years old  Completed   Hepatitis C Screening  Completed   Zoster Vaccines- Shingrix  Completed   HPV VACCINES  Aged Out   COVID-19 Vaccine  Discontinued    Health Maintenance  Health Maintenance Due  Topic Date Due   DTaP/Tdap/Td (3 - Td or Tdap) 09/18/2021    Colorectal cancer  screening: No longer required.   Lung Cancer Screening: (Low Dose CT Chest recommended if Age 70-80 years, 20 pack-year currently smoking OR have quit w/in 15years.) does not qualify.    Additional Screening:  Hepatitis C Screening: does qualify; Completed 05/09/19  Vision Screening: Recommended annual ophthalmology exams for early detection of glaucoma and other disorders of the eye. Is the patient up to date with their annual eye exam?  Yes  Who is the provider or what is the name of the office in which the patient attends annual eye exams? Dr.Brasington If pt is not established with a provider, would they like to be referred to a provider to establish care? No .   Dental Screening: Recommended annual dental exams for proper oral hygiene  Community Resource Referral / Chronic Care Management: CRR required this visit?  No   CCM required this visit?  No     Plan:     I have personally reviewed and noted the following in the patient's chart:   Medical and social history Use of alcohol, tobacco or illicit drugs  Current medications and supplements including opioid prescriptions. Patient is not currently taking opioid prescriptions. Functional ability and status Nutritional status Physical activity Advanced directives List of other physicians Hospitalizations, surgeries, and ER visits in previous 12 months Vitals Screenings to include cognitive, depression, and falls Referrals and appointments  In addition, I have reviewed and discussed with patient certain preventive protocols, quality metrics, and best practice recommendations. A written personalized care plan for preventive services as well as general preventive health recommendations were provided to patient.     Hal Hope, LPN   [id number]   After Visit Summary: (MyChart) Due to this being a telephonic visit, the after visit summary with patients personalized plan was offered to patient via MyChart   Nurse Notes:  none

## 2023-03-23 NOTE — Patient Instructions (Signed)
Timothy Scott , Thank you for taking time to come for your Medicare Wellness Visit. I appreciate your ongoing commitment to your health goals. Please review the following plan we discussed and let me know if I can assist you in the future.   These are the goals we discussed:  Goals      DIET - EAT MORE FRUITS AND VEGETABLES     DIET - INCREASE WATER INTAKE     Recommend to drink at least 6-8 8oz glasses of water per day.     Patient Stated     Would like to start going back to gym and start playing golf again - to be in better health overall        This is a list of the screening recommended for you and due dates:  Health Maintenance  Topic Date Due   DTaP/Tdap/Td vaccine (3 - Td or Tdap) 09/18/2021   Flu Shot  04/06/2023   Medicare Annual Wellness Visit  03/22/2024   Colon Cancer Screening  04/27/2027   Pneumonia Vaccine  Completed   Hepatitis C Screening  Completed   Zoster (Shingles) Vaccine  Completed   HPV Vaccine  Aged Out   COVID-19 Vaccine  Discontinued    Advanced directives: no  Conditions/risks identified: none  Next appointment: Follow up in one year for your annual wellness visit. 04/03/24 @ 8:15 am by phone  Preventive Care 65 Years and Older, Male  Preventive care refers to lifestyle choices and visits with your health care provider that can promote health and wellness. What does preventive care include? A yearly physical exam. This is also called an annual well check. Dental exams once or twice a year. Routine eye exams. Ask your health care provider how often you should have your eyes checked. Personal lifestyle choices, including: Daily care of your teeth and gums. Regular physical activity. Eating a healthy diet. Avoiding tobacco and drug use. Limiting alcohol use. Practicing safe sex. Taking low doses of aspirin every day. Taking vitamin and mineral supplements as recommended by your health care provider. What happens during an annual well  check? The services and screenings done by your health care provider during your annual well check will depend on your age, overall health, lifestyle risk factors, and family history of disease. Counseling  Your health care provider may ask you questions about your: Alcohol use. Tobacco use. Drug use. Emotional well-being. Home and relationship well-being. Sexual activity. Eating habits. History of falls. Memory and ability to understand (cognition). Work and work Astronomer. Screening  You may have the following tests or measurements: Height, weight, and BMI. Blood pressure. Lipid and cholesterol levels. These may be checked every 5 years, or more frequently if you are over 41 years old. Skin check. Lung cancer screening. You may have this screening every year starting at age 27 if you have a 30-pack-year history of smoking and currently smoke or have quit within the past 15 years. Fecal occult blood test (FOBT) of the stool. You may have this test every year starting at age 40. Flexible sigmoidoscopy or colonoscopy. You may have a sigmoidoscopy every 5 years or a colonoscopy every 10 years starting at age 28. Prostate cancer screening. Recommendations will vary depending on your family history and other risks. Hepatitis C blood test. Hepatitis B blood test. Sexually transmitted disease (STD) testing. Diabetes screening. This is done by checking your blood sugar (glucose) after you have not eaten for a while (fasting). You may have this done  every 1-3 years. Abdominal aortic aneurysm (AAA) screening. You may need this if you are a current or former smoker. Osteoporosis. You may be screened starting at age 43 if you are at high risk. Talk with your health care provider about your test results, treatment options, and if necessary, the need for more tests. Vaccines  Your health care provider may recommend certain vaccines, such as: Influenza vaccine. This is recommended every  year. Tetanus, diphtheria, and acellular pertussis (Tdap, Td) vaccine. You may need a Td booster every 10 years. Zoster vaccine. You may need this after age 62. Pneumococcal 13-valent conjugate (PCV13) vaccine. One dose is recommended after age 47. Pneumococcal polysaccharide (PPSV23) vaccine. One dose is recommended after age 28. Talk to your health care provider about which screenings and vaccines you need and how often you need them. This information is not intended to replace advice given to you by your health care provider. Make sure you discuss any questions you have with your health care provider. Document Released: 09/18/2015 Document Revised: 05/11/2016 Document Reviewed: 06/23/2015 Elsevier Interactive Patient Education  2017 [employer].  Fall Prevention in the Home Falls can cause injuries. They can happen to people of all ages. There are many things you can do to make your home safe and to help prevent falls. What can I do on the outside of my home? Regularly fix the edges of walkways and driveways and fix any cracks. Remove anything that might make you trip as you walk through a door, such as a raised step or threshold. Trim any bushes or trees on the path to your home. Use bright outdoor lighting. Clear any walking paths of anything that might make someone trip, such as rocks or tools. Regularly check to see if handrails are loose or broken. Make sure that both sides of any steps have handrails. Any raised decks and porches should have guardrails on the edges. Have any leaves, snow, or ice cleared regularly. Use sand or salt on walking paths during winter. Clean up any spills in your garage right away. This includes oil or grease spills. What can I do in the bathroom? Use night lights. Install grab bars by the toilet and in the tub and shower. Do not use towel bars as grab bars. Use non-skid mats or decals in the tub or shower. If you need to sit down in the shower, use a  plastic, non-slip stool. Keep the floor dry. Clean up any water that spills on the floor as soon as it happens. Remove soap buildup in the tub or shower regularly. Attach bath mats securely with double-sided non-slip rug tape. Do not have throw rugs and other things on the floor that can make you trip. What can I do in the bedroom? Use night lights. Make sure that you have a light by your bed that is easy to reach. Do not use any sheets or blankets that are too big for your bed. They should not hang down onto the floor. Have a firm chair that has side arms. You can use this for support while you get dressed. Do not have throw rugs and other things on the floor that can make you trip. What can I do in the kitchen? Clean up any spills right away. Avoid walking on wet floors. Keep items that you use a lot in easy-to-reach places. If you need to reach something above you, use a strong step stool that has a grab bar. Keep electrical cords out of the  way. Do not use floor polish or wax that makes floors slippery. If you must use wax, use non-skid floor wax. Do not have throw rugs and other things on the floor that can make you trip. What can I do with my stairs? Do not leave any items on the stairs. Make sure that there are handrails on both sides of the stairs and use them. Fix handrails that are broken or loose. Make sure that handrails are as long as the stairways. Check any carpeting to make sure that it is firmly attached to the stairs. Fix any carpet that is loose or worn. Avoid having throw rugs at the top or bottom of the stairs. If you do have throw rugs, attach them to the floor with carpet tape. Make sure that you have a light switch at the top of the stairs and the bottom of the stairs. If you do not have them, ask someone to add them for you. What else can I do to help prevent falls? Wear shoes that: Do not have high heels. Have rubber bottoms. Are comfortable and fit you  well. Are closed at the toe. Do not wear sandals. If you use a stepladder: Make sure that it is fully opened. Do not climb a closed stepladder. Make sure that both sides of the stepladder are locked into place. Ask someone to hold it for you, if possible. Clearly mark and make sure that you can see: Any grab bars or handrails. First and last steps. Where the edge of each step is. Use tools that help you move around (mobility aids) if they are needed. These include: Canes. Walkers. Scooters. Crutches. Turn on the lights when you go into a dark area. Replace any light bulbs as soon as they burn out. Set up your furniture so you have a clear path. Avoid moving your furniture around. If any of your floors are uneven, fix them. If there are any pets around you, be aware of where they are. Review your medicines with your doctor. Some medicines can make you feel dizzy. This can increase your chance of falling. Ask your doctor what other things that you can do to help prevent falls. This information is not intended to replace advice given to you by your health care provider. Make sure you discuss any questions you have with your health care provider. Document Released: 06/18/2009 Document Revised: 01/28/2016 Document Reviewed: 09/26/2014 Elsevier Interactive Patient Education  2017 [employer].

## 2023-04-10 DIAGNOSIS — R42 Dizziness and giddiness: Secondary | ICD-10-CM | POA: Diagnosis not present

## 2023-04-10 DIAGNOSIS — R09A2 Foreign body sensation, throat: Secondary | ICD-10-CM | POA: Diagnosis not present

## 2023-04-10 DIAGNOSIS — M25552 Pain in left hip: Secondary | ICD-10-CM | POA: Diagnosis not present

## 2023-04-12 ENCOUNTER — Other Ambulatory Visit: Payer: Self-pay | Admitting: Student

## 2023-04-12 DIAGNOSIS — R42 Dizziness and giddiness: Secondary | ICD-10-CM

## 2023-04-12 DIAGNOSIS — H5713 Ocular pain, bilateral: Secondary | ICD-10-CM

## 2023-04-29 ENCOUNTER — Ambulatory Visit
Admission: RE | Admit: 2023-04-29 | Discharge: 2023-04-29 | Disposition: A | Discharge status: Home / Self Care | Payer: PPO | Source: Ambulatory Visit | Attending: Student | Admitting: Student

## 2023-04-29 DIAGNOSIS — R42 Dizziness and giddiness: Secondary | ICD-10-CM

## 2023-04-29 DIAGNOSIS — R2689 Other abnormalities of gait and mobility: Secondary | ICD-10-CM | POA: Diagnosis not present

## 2023-04-29 DIAGNOSIS — R519 Headache, unspecified: Secondary | ICD-10-CM | POA: Diagnosis not present

## 2023-04-29 DIAGNOSIS — H5713 Ocular pain, bilateral: Secondary | ICD-10-CM

## 2023-05-30 ENCOUNTER — Ambulatory Visit (INDEPENDENT_AMBULATORY_CARE_PROVIDER_SITE_OTHER): Payer: PPO | Admitting: Internal Medicine

## 2023-05-30 ENCOUNTER — Encounter: Payer: Self-pay | Admitting: Internal Medicine

## 2023-05-30 ENCOUNTER — Other Ambulatory Visit
Admission: RE | Admit: 2023-05-30 | Discharge: 2023-05-30 | Disposition: A | Discharge status: Home / Self Care | Payer: PPO | Attending: Internal Medicine | Admitting: Internal Medicine

## 2023-05-30 VITALS — BP 126/70 | HR 84 | Ht 71.0 in | Wt 167.0 lb

## 2023-05-30 DIAGNOSIS — N401 Enlarged prostate with lower urinary tract symptoms: Secondary | ICD-10-CM | POA: Diagnosis not present

## 2023-05-30 DIAGNOSIS — K219 Gastro-esophageal reflux disease without esophagitis: Secondary | ICD-10-CM

## 2023-05-30 DIAGNOSIS — N138 Other obstructive and reflux uropathy: Secondary | ICD-10-CM

## 2023-05-30 DIAGNOSIS — I2510 Atherosclerotic heart disease of native coronary artery without angina pectoris: Secondary | ICD-10-CM | POA: Insufficient documentation

## 2023-05-30 DIAGNOSIS — M1612 Unilateral primary osteoarthritis, left hip: Secondary | ICD-10-CM | POA: Diagnosis not present

## 2023-05-30 DIAGNOSIS — Z0000 Encounter for general adult medical examination without abnormal findings: Secondary | ICD-10-CM

## 2023-05-30 DIAGNOSIS — N400 Enlarged prostate without lower urinary tract symptoms: Secondary | ICD-10-CM | POA: Insufficient documentation

## 2023-05-30 DIAGNOSIS — F4310 Post-traumatic stress disorder, unspecified: Secondary | ICD-10-CM

## 2023-05-30 DIAGNOSIS — I6522 Occlusion and stenosis of left carotid artery: Secondary | ICD-10-CM

## 2023-05-30 DIAGNOSIS — I495 Sick sinus syndrome: Secondary | ICD-10-CM

## 2023-05-30 DIAGNOSIS — E538 Deficiency of other specified B group vitamins: Secondary | ICD-10-CM | POA: Diagnosis not present

## 2023-05-30 LAB — CBC WITH DIFFERENTIAL/PLATELET
Abs Immature Granulocytes: 0.04 10*3/uL (ref 0.00–0.07)
Basophils Absolute: 0 10*3/uL (ref 0.0–0.1)
Basophils Relative: 1 %
Eosinophils Absolute: 0.1 10*3/uL (ref 0.0–0.5)
Eosinophils Relative: 1 %
HCT: 38.6 % — ABNORMAL LOW (ref 39.0–52.0)
Hemoglobin: 13.4 g/dL (ref 13.0–17.0)
Immature Granulocytes: 1 %
Lymphocytes Relative: 12 %
Lymphs Abs: 1 10*3/uL (ref 0.7–4.0)
MCH: 34.1 pg — ABNORMAL HIGH (ref 26.0–34.0)
MCHC: 34.7 g/dL (ref 30.0–36.0)
MCV: 98.2 fL (ref 80.0–100.0)
Monocytes Absolute: 0.8 10*3/uL (ref 0.1–1.0)
Monocytes Relative: 10 %
Neutro Abs: 6.4 10*3/uL (ref 1.7–7.7)
Neutrophils Relative %: 75 %
Platelets: 255 10*3/uL (ref 150–400)
RBC: 3.93 MIL/uL — ABNORMAL LOW (ref 4.22–5.81)
RDW: 12.6 % (ref 11.5–15.5)
WBC: 8.3 10*3/uL (ref 4.0–10.5)
nRBC: 0 % (ref 0.0–0.2)

## 2023-05-30 LAB — LIPID PANEL
Cholesterol: 163 mg/dL (ref 0–200)
HDL: 63 mg/dL (ref >40)
LDL Cholesterol: 83 mg/dL (ref 0–99)
Total CHOL/HDL Ratio: 2.6 RATIO
Triglycerides: 84 mg/dL (ref <150)
VLDL: 17 mg/dL (ref 0–40)

## 2023-05-30 LAB — COMPREHENSIVE METABOLIC PANEL
ALT: 17 U/L (ref 0–44)
AST: 18 U/L (ref 15–41)
Albumin: 4 g/dL (ref 3.5–5.0)
Alkaline Phosphatase: 35 U/L — ABNORMAL LOW (ref 38–126)
Anion gap: 6 (ref 5–15)
BUN: 21 mg/dL (ref 8–23)
CO2: 28 mmol/L (ref 22–32)
Calcium: 9.1 mg/dL (ref 8.9–10.3)
Chloride: 106 mmol/L (ref 98–111)
Creatinine, Ser: 0.84 mg/dL (ref 0.61–1.24)
GFR, Estimated: >60 mL/min (ref >60)
Glucose, Bld: 102 mg/dL — ABNORMAL HIGH (ref 70–99)
Potassium: 4.4 mmol/L (ref 3.5–5.1)
Sodium: 140 mmol/L (ref 135–145)
Total Bilirubin: 0.5 mg/dL (ref 0.3–1.2)
Total Protein: 6.4 g/dL — ABNORMAL LOW (ref 6.5–8.1)

## 2023-05-30 LAB — PSA: Prostatic Specific Antigen: 0.19 ng/mL (ref 0.00–4.00)

## 2023-05-30 NOTE — Progress Notes (Signed)
Date:  05/30/2023   Name:  Timothy Scott   DOB:  [birth year]   MRN:  [id number]   Chief Complaint: Annual Exam Timothy Scott is a 77 y.o. male who presents today for his Complete Annual Exam. He feels fairly well. He reports exercising - some. He reports he is sleeping fairly well.   Colonoscopy: 04/2022 repeat 5 yrs  Immunization History  Administered Date(s) Administered   H1N1 10/06/2008   Influenza, High Dose Seasonal PF 06/11/2019   Influenza, Seasonal, Injecte, Preservative Fre 08/03/2009, 07/19/2013   Influenza,inj,Quad PF,6+ Mos 07/21/2017, 06/18/2018   Influenza-Unspecified 07/18/2002, 08/22/2003, 07/15/2004, 07/07/2005, 07/25/2006, 08/21/2007, 05/22/2008, 10/06/2008, 08/05/2010, 07/07/2011, 06/05/2012, 07/06/2014, 07/02/2015, 09/06/2015, 07/06/2016, 06/05/2017, 06/18/2018, 07/06/2018, 06/06/2019, 06/30/2020   Moderna Covid-19 Vaccine Bivalent Booster 56yrs & up 08/28/2021   Moderna Sars-Covid-2 Vaccination 11/04/2019, 12/05/2019, 07/14/2020, 12/29/2020   Pneumococcal Conjugate-13 01/28/2014, 05/28/2015   Pneumococcal Polysaccharide-23 03/30/2001, 09/06/2010, 09/19/2011   Tdap 09/06/2010, 09/19/2011   Zoster Recombinant(Shingrix) 08/07/2018, 10/12/2018   Zoster, Live 09/06/2010, 02/04/2011   Health Maintenance Due  Topic Date Due   DTaP/Tdap/Td (3 - Td or Tdap) 09/18/2021    Lab Results  Component Value Date   PSA1 0.2 05/09/2019   PSA1 0.2 02/23/2017   PSA 0.2 12/13/2017    Hip Pain  There was no injury mechanism. The pain is present in the left hip. The quality of the pain is described as aching and cramping. The pain is moderate. The pain has been Fluctuating since onset.  Gastroesophageal Reflux He complains of globus sensation. He reports no abdominal pain, no chest pain, no choking or no wheezing. This is a chronic problem. The problem occurs occasionally. Associated symptoms include fatigue. He has tried a PPI for the symptoms.  Fatigue/weakness - he has  SSS but has not seen Cardiology.  Carotid US stable with minimal plaque.  Recommend he see Cards for evaluation.   Lab Results  Component Value Date   NA 137 03/06/2023   K 4.1 03/06/2023   CO2 26 03/06/2023   GLUCOSE 100 (H) 03/06/2023   BUN 21 03/06/2023   CREATININE 0.84 03/06/2023   CALCIUM 8.9 03/06/2023   GFRNONAA >60 03/06/2023   Lab Results  Component Value Date   CHOL 185 05/26/2022   HDL 71 05/26/2022   LDLCALC 107 (H) 05/26/2022   TRIG 33 05/26/2022   CHOLHDL 2.6 05/26/2022   Lab Results  Component Value Date   TSH 2.247 03/06/2023   Lab Results  Component Value Date   HGBA1C 5.1 05/26/2022   Lab Results  Component Value Date   WBC 8.3 05/30/2023   HGB 13.4 05/30/2023   HCT 38.6 (L) 05/30/2023   MCV 98.2 05/30/2023   PLT 255 05/30/2023   Lab Results  Component Value Date   ALT 19 03/06/2023   AST 20 03/06/2023   ALKPHOS 38 03/06/2023   BILITOT 0.8 03/06/2023   Lab Results  Component Value Date   VD25OH 32 04/13/2020   Lab Results  Component Value Date   VITAMINB12 1,083 (H) 05/26/2022     Review of Systems  Constitutional:  Positive for fatigue. Negative for appetite change, chills, diaphoresis and unexpected weight change.  HENT:  Positive for trouble swallowing. Negative for hearing loss, tinnitus and voice change.   Eyes:  Negative for visual disturbance.  Respiratory:  Negative for choking, shortness of breath and wheezing.   Cardiovascular:  Negative for chest pain, palpitations and leg swelling.  Gastrointestinal:  Negative for abdominal pain,  blood in stool, constipation and diarrhea.  Genitourinary:  Negative for difficulty urinating, dysuria and frequency.  Musculoskeletal:  Positive for arthralgias and gait problem (left hip). Negative for back pain and myalgias.  Skin:  Negative for color change and rash.  Neurological:  Negative for dizziness, syncope and headaches.  Hematological:  Negative for adenopathy.   Psychiatric/Behavioral:  Positive for dysphoric mood and sleep disturbance. The patient is nervous/anxious.     Patient Active Problem List   Diagnosis Date Noted   Gastric intestinal metaplasia    Leg pain, bilateral 07/22/2021   Sensory polyneuropathy 07/22/2021   Intestinal metaplasia of gastric mucosa 05/20/2021   Senile nuclear sclerosis 12/02/2020   Restless legs syndrome 12/02/2020   Osteoarthritis of hip 12/02/2020   Myopia 12/02/2020   Esophageal reflux 12/02/2020   Hearing loss 12/02/2020   Actinic keratosis 12/02/2020   Chronic obstructive lung disease (HCC) 12/02/2020   Chronic neck pain 12/02/2020   Other ill-defined and unknown causes of morbidity and mortality 12/02/2020   Neurocardiogenic pre-syncope 10/13/2020   Dietary B12 deficiency 05/14/2020   Headache disorder 04/13/2020   Other diseases of stomach and duodenum    Hx of transient ischemic attack (TIA) 01/28/2020   Elevated left ventricular end-diastolic pressure (LVEDP) 06/01/2019   Moderate aortic regurgitation 06/01/2019   Carotid artery plaque, left 07/09/2018   Sick sinus syndrome (HCC) 03/09/2017   Anosmia 05/05/2016   Dysphagia 02/03/2016   Tobacco use disorder, moderate, in sustained remission 03/11/2015   Lumbar disc herniation with radiculopathy 03/11/2015   Degeneration of intervertebral disc 03/10/2015   BPH with obstruction/lower urinary tract symptoms 03/10/2015   Degenerative arthritis of finger 03/10/2015   Post-traumatic stress disorder 03/10/2015   Lattice degeneration 03/10/2015   Leg varices 03/10/2015    Allergies  Allergen Reactions   Prazosin     Other reaction(s): Dizziness    Past Surgical History:  Procedure Laterality Date   CATARACT EXTRACTION Right 2015   CATARACT EXTRACTION W/PHACO Left 10/21/2015   Procedure: CATARACT EXTRACTION PHACO AND INTRAOCULAR LENS PLACEMENT (IOC);  Surgeon: Lockie Mola, MD;  Location[facility];  Service: Ophthalmology;   Laterality: Left;   CATARACT EXTRACTION, BILATERAL  2016   COLONOSCOPY  2014   benign polyps   COLONOSCOPY WITH PROPOFOL N/A 03/10/2020   Procedure: COLONOSCOPY WITH PROPOFOL;  Surgeon: Midge Minium, MD;  Location: [facility] END[facility]COPY;  Service: Endoscopy;  Laterality: N/A;  PRIORITY 4   COLONOSCOPY WITH PROPOFOL N/A 04/26/2022   Procedure: COLONOSCOPY WITH PROPOFOL;  Surgeon: Midge Minium, MD;  Location: [facility] ENDOSCOPY;  Service: Endoscopy;  Laterality: N/A;   CYSTOSCOPY     ESOPHAGEAL MANOMETRY N/A 08/03/2016   Procedure: ESOPHAGEAL MANOMETRY (EM);  Surgeon: Napoleon Form, MD;  Location: [facility] ENDOSCOPY;  Service: Endoscopy;  Laterality: N/A;   ESOPHAGOGASTRODUODENOSCOPY     ESOPHAGOGASTRODUODENOSCOPY N/A 04/26/2022   Procedure: ESOPHAGOGASTRODUODENOSCOPY (EGD);  Surgeon: Midge Minium, MD;  Location: [facility] ENDOSCOPY;  Service: Endoscopy;  Laterality: N/A;   ESOPHAGOGASTRODUODENOSCOPY (EGD) WITH PROPOFOL N/A 02/17/2016   with esoph dilatation   ESOPHAGOGASTRODUODENOSCOPY (EGD) WITH PROPOFOL N/A 03/10/2020   Procedure: ESOPHAGOGASTRODUODENOSCOPY (EGD) WITH PROPOFOL;  Surgeon: Midge Minium, MD;  Location: [facility] ENDOSCOPY;  Service: Endoscopy;  Laterality: N/A;   TONSILLECTOMY      Social History   Tobacco Use   Smoking status: Former    Current packs/day: 0.00    Average packs/day: 0.5 packs/day for 20.0 years (10.0 ttl pk-yrs)    Types: Cigarettes, Pipe, Cigars    Start date:  04/05/1966    Quit date: 04/05/1986    Years since quitting: 37.1   Smokeless tobacco: Never   Tobacco comments:    smoking cessation materials not required  Vaping Use   Vaping status: Never Used  Substance Use Topics   Alcohol use: Yes    Alcohol/week: 6.0 standard drinks of alcohol    Types: 6 Standard drinks or equivalent per week    Comment: socially   Drug use: No     Medication list has been reviewed and updated.  Current Meds  Medication Sig   Calcium Carbonate-Vit D-Min (CALCIUM 1200 PO) Take 1 tablet  by mouth daily.    cyanocobalamin 1000 MCG tablet Take by mouth.   finasteride (PROSCAR) 5 MG tablet TAKE ONE TABLET BY MOUTH EVERY DAY FOR BENIGN PROSTATIC HYPERPLASIA   glucosamine-chondroitin 500-400 MG tablet Take 1 tablet by mouth daily.   Misc Natural Products (DAILY HERBS PROSTATE PO) Take 1 tablet by mouth daily.   Multiple Vitamins-Minerals (ZINC PO) Take by mouth daily.   naproxen (NAPROSYN) 500 MG tablet Take 500 mg by mouth 2 (two) times daily as needed for mild pain or moderate pain.    omeprazole (PRILOSEC) 40 MG capsule Take 40 mg by mouth daily.    tamsulosin (FLOMAX) 0.4 MG CAPS capsule Take 0.4 mg by mouth as needed.   traZODone (DESYREL) 100 MG tablet TAKE TWO TABLETS BY MOUTH AT BEDTIME FOR SLEEP. (YOU MAY TAKE A 3RD TABLET IF NEEDED, IF YOU WAKE UP DURING THE NIGHT)       05/30/2023    9:25 AM 03/06/2023    9:58 AM 07/20/2022   11:44 AM 05/26/2022    8:28 AM  GAD 7 : Generalized Anxiety Score  Nervous, Anxious, on Edge 1 1 1 1   Control/stop worrying 1 1 1 1   Worry too much - different things 1 0 1 1  Trouble relaxing 1 1 0 2  Restless 2 2 2 2   Easily annoyed or irritable 2 2 2 2   Afraid - awful might happen 0 1 1 1   Total GAD 7 Score 8 8 8 10   Anxiety Difficulty Somewhat difficult Somewhat difficult Not difficult at all Very difficult       05/30/2023    9:25 AM 03/23/2023    8:59 AM 03/06/2023    9:58 AM  Depression screen PHQ 2/9  Decreased Interest 1 1 1   Down, Depressed, Hopeless 1 1 1   PHQ - 2 Score 2 2 2   Altered sleeping 2 1 2   Tired, decreased energy 2 1 3   Change in appetite 1 0 3  Feeling bad or failure about yourself  1 0 1  Trouble concentrating 2 0 2  Moving slowly or fidgety/restless 1 0 1  Suicidal thoughts 0 0 0  PHQ-9 Score 11 4 14   Difficult doing work/chores Somewhat difficult Not difficult at all Somewhat difficult    BP Readings from Last 3 Encounters:  05/30/23 126/70  03/06/23 102/64  12/27/22 128/74    Physical Exam Vitals  and nursing note reviewed.  Constitutional:      Appearance: Normal appearance. He is well-developed.  HENT:     Head: Normocephalic.     Right Ear: Tympanic membrane, ear canal and external ear normal.     Left Ear: Tympanic membrane, ear canal and external ear normal.     Nose: Nose normal.  Eyes:     Conjunctiva/sclera: Conjunctivae normal.     Pupils: Pupils are equal, round,  and reactive to light.  Neck:     Thyroid: No thyromegaly.     Vascular: No carotid bruit.  Cardiovascular:     Rate and Rhythm: Normal rate and regular rhythm.     Heart sounds: Normal heart sounds.  Pulmonary:     Effort: Pulmonary effort is normal.     Breath sounds: Normal breath sounds. No wheezing.  Chest:  Breasts:    Right: No mass.     Left: No mass.  Abdominal:     General: Bowel sounds are normal.     Palpations: Abdomen is soft.     Tenderness: There is no abdominal tenderness.  Musculoskeletal:     Cervical back: Normal range of motion and neck supple.     Left hip: Decreased range of motion.     Right lower leg: No edema.     Left lower leg: No edema.  Lymphadenopathy:     Cervical: No cervical adenopathy.  Skin:    General: Skin is warm and dry.  Neurological:     Mental Status: He is alert and oriented to person, place, and time.     Deep Tendon Reflexes: Reflexes are normal and symmetric.  Psychiatric:        Attention and Perception: Attention normal.        Mood and Affect: Mood normal.        Thought Content: Thought content normal.     Wt Readings from Last 3 Encounters:  05/30/23 167 lb (75.8 kg)  03/23/23 165 lb (74.8 kg)  03/06/23 165 lb 3.2 oz (74.9 kg)    BP 126/70   Pulse 84   Ht 5\' 11"  (1.803 m)   Wt 167 lb (75.8 kg)   SpO2 99%   BMI 23.29 kg/m   Assessment and Plan:  Problem List Items Addressed This Visit       Unprioritized   Sick sinus syndrome (HCC)    Referred to Cardiology but never scheduled. Will refer to [facility]      Relevant  Orders   Ambulatory referral to Cardiology   Comprehensive metabolic panel   Post-traumatic stress disorder (Chronic)    Continues on Trazodone to help with sleep.      Osteoarthritis of hip    Seen by Ortho - needs hip replacement He will call for referral when he decided where he wants to go.      Esophageal reflux (Chronic)    Reflux symptoms are minimal on current therapy - omeprazole. No red flag signs such as weight loss, n/v, melena He continues to have a globus sensation        Relevant Orders   CBC with Differential/Platelet (Completed)   Dietary B12 deficiency (Chronic)    On oral supplements      Carotid artery plaque, left (Chronic)    Elevated cholesterol noted. He should be on statin medication      Relevant Orders   Lipid panel   BPH with obstruction/lower urinary tract symptoms (Chronic)    Symptoms being controlled by Flomax and Proscar      Relevant Orders   PSA   Other Visit Diagnoses     Annual physical exam    -  Primary   He will get influenza and Covid vaccines at [facility] later this month.       No follow-ups on file.    Reubin Milan, MD [facility] [place]

## 2023-05-30 NOTE — Assessment & Plan Note (Signed)
Seen by Ortho - needs hip replacement He will call for referral when he decided where he wants to go.

## 2023-05-30 NOTE — Assessment & Plan Note (Signed)
On oral supplements

## 2023-05-30 NOTE — Assessment & Plan Note (Addendum)
Referred to Cardiology but never scheduled. Will refer to [employer]

## 2023-05-30 NOTE — Assessment & Plan Note (Signed)
Continues on Trazodone to help with sleep.

## 2023-05-30 NOTE — Assessment & Plan Note (Signed)
Elevated cholesterol noted. He should be on statin medication

## 2023-05-30 NOTE — Assessment & Plan Note (Signed)
Symptoms being controlled by Flomax and Proscar

## 2023-05-30 NOTE — Assessment & Plan Note (Signed)
Reflux symptoms are minimal on current therapy - omeprazole. No red flag signs such as weight loss, n/v, melena He continues to have a globus sensation

## 2023-05-31 NOTE — Progress Notes (Signed)
Discussed labs with pt, voiced understanding.

## 2023-06-22 DIAGNOSIS — R569 Unspecified convulsions: Secondary | ICD-10-CM | POA: Diagnosis not present

## 2023-06-22 DIAGNOSIS — R42 Dizziness and giddiness: Secondary | ICD-10-CM | POA: Diagnosis not present

## 2023-06-22 DIAGNOSIS — R09A2 Foreign body sensation, throat: Secondary | ICD-10-CM | POA: Diagnosis not present

## 2023-09-08 ENCOUNTER — Ambulatory Visit: Payer: Self-pay | Admitting: *Deleted

## 2023-09-08 NOTE — Telephone Encounter (Signed)
 Message from [place] S sent at 09/08/2023  3:24 PM EST  Summary: Eye swelling, weakness   The patient has been having swelling near his eyes for about a week now. He reports no visual issues but in discomfort. He says he also has been feeling weak. He has tried Claritin and over the counter allergy tablets but that has not really helped. Please assist patient further          Call History  Contact Date/Time Type Contact Phone/Fax By  09/08/2023 03:21 PM EST Phone (Incoming) Timothy Scott, Timothy Scott (Self) [phone] (Confidential) Peri Selinda SQUIBB   Reason for Disposition  [1] MILD eyelid swelling (puffiness) AND [2] persists > 3 days  (Exception: Suspect mosquito bites.)  Answer Assessment - Initial Assessment Questions 1. ONSET: When did the swelling start? (e.g., minutes, hours, days)     I'm having puffiness around my eyes.  That started a week ago   Watery. 2. LOCATION: What part of the eyelids is swollen?     It feels like around my whole eye itself is swollen.   They don't look swollen around my eyelids and underneath. 3. SEVERITY: How swollen is it?     Not looking swollen 4. ITCHING: Is there any itching? If Yes, ask: How much?   (Scale 1-10; mild, moderate or severe)     I rub them a lot 5. PAIN: Is the swelling painful to touch? If Yes, ask: How painful is it?   (Scale 1-10; mild, moderate or severe)     No 6. FEVER: Do you have a fever? If Yes, ask: What is it, how was it measured, and when did it start?      No 7. CAUSE: What do you think is causing the swelling?     I don't know 8. RECURRENT SYMPTOM: Have you had eyelid swelling before? If Yes, ask: When was the last time? What happened that time?     I 9. OTHER SYMPTOMS: Do you have any other symptoms? (e.g., blurred vision, eye discharge, rash, runny nose)     Watery eyes 10. PREGNANCY: Is there any chance you are pregnant? When was your last menstrual period?       N/A  Protocols used: Eye  - Swelling-A-AH

## 2023-09-08 NOTE — Telephone Encounter (Signed)
  Chief Complaint: Bilateral eye puffiness    Antihistamines not helping Symptoms: Watery eyes, rubbing them a lot.   Some itching Frequency: For a week Pertinent Negatives: Patient denies burning, pain, redness eyelid pain.    My eyes don't look puffy when I look in the mirror but they feel puffy.   Disposition: [] ED /[] Urgent Care (no appt availability in office) / [x] Appointment(In office/virtual)/ []  [facility]/ [] Home Care/ [] Refused Recommended Disposition /[] [facility]/ []  Follow-up with PCP Additional Notes: Appt made with Dr. Justus 09/19/2023 8:40.

## 2023-09-19 ENCOUNTER — Ambulatory Visit: Payer: PPO | Admitting: Internal Medicine

## 2023-09-21 DIAGNOSIS — H04123 Dry eye syndrome of bilateral lacrimal glands: Secondary | ICD-10-CM | POA: Diagnosis not present

## 2023-09-21 DIAGNOSIS — H02883 Meibomian gland dysfunction of right eye, unspecified eyelid: Secondary | ICD-10-CM | POA: Diagnosis not present

## 2023-10-09 DIAGNOSIS — I495 Sick sinus syndrome: Secondary | ICD-10-CM | POA: Diagnosis not present

## 2023-10-30 DIAGNOSIS — I495 Sick sinus syndrome: Secondary | ICD-10-CM | POA: Diagnosis not present

## 2023-11-15 DIAGNOSIS — I495 Sick sinus syndrome: Secondary | ICD-10-CM | POA: Diagnosis not present

## 2023-12-07 DIAGNOSIS — M2012 Hallux valgus (acquired), left foot: Secondary | ICD-10-CM | POA: Diagnosis not present

## 2023-12-07 DIAGNOSIS — M2011 Hallux valgus (acquired), right foot: Secondary | ICD-10-CM | POA: Diagnosis not present

## 2023-12-07 DIAGNOSIS — M2042 Other hammer toe(s) (acquired), left foot: Secondary | ICD-10-CM | POA: Diagnosis not present

## 2023-12-07 DIAGNOSIS — D2371 Other benign neoplasm of skin of right lower limb, including hip: Secondary | ICD-10-CM | POA: Diagnosis not present

## 2023-12-07 DIAGNOSIS — M79672 Pain in left foot: Secondary | ICD-10-CM | POA: Diagnosis not present

## 2023-12-07 DIAGNOSIS — M2041 Other hammer toe(s) (acquired), right foot: Secondary | ICD-10-CM | POA: Diagnosis not present

## 2023-12-07 DIAGNOSIS — M79671 Pain in right foot: Secondary | ICD-10-CM | POA: Diagnosis not present

## 2023-12-11 ENCOUNTER — Other Ambulatory Visit: Payer: Self-pay | Admitting: Internal Medicine

## 2023-12-11 ENCOUNTER — Telehealth: Payer: Self-pay | Admitting: Internal Medicine

## 2023-12-11 DIAGNOSIS — N401 Enlarged prostate with lower urinary tract symptoms: Secondary | ICD-10-CM

## 2023-12-11 DIAGNOSIS — R09A2 Foreign body sensation, throat: Secondary | ICD-10-CM | POA: Insufficient documentation

## 2023-12-11 NOTE — Progress Notes (Unsigned)
 Date:  12/11/2023   Name:  Timothy Scott   DOB:  [birth year]   MRN:  [id number]   Chief Complaint: No chief complaint on file.  HPI  Review of Systems   Lab Results  Component Value Date   NA 140 05/30/2023   K 4.4 05/30/2023   CO2 28 05/30/2023   GLUCOSE 102 (H) 05/30/2023   BUN 21 05/30/2023   CREATININE 0.84 05/30/2023   CALCIUM 9.1 05/30/2023   GFRNONAA >60 05/30/2023   Lab Results  Component Value Date   CHOL 163 05/30/2023   HDL 63 05/30/2023   LDLCALC 83 05/30/2023   TRIG 84 05/30/2023   CHOLHDL 2.6 05/30/2023   Lab Results  Component Value Date   TSH 2.247 03/06/2023   Lab Results  Component Value Date   HGBA1C 5.1 05/26/2022   Lab Results  Component Value Date   WBC 8.3 05/30/2023   HGB 13.4 05/30/2023   HCT 38.6 (L) 05/30/2023   MCV 98.2 05/30/2023   PLT 255 05/30/2023   Lab Results  Component Value Date   ALT 17 05/30/2023   AST 18 05/30/2023   ALKPHOS 35 (L) 05/30/2023   BILITOT 0.5 05/30/2023   Lab Results  Component Value Date   VD25OH 32 04/13/2020     Patient Active Problem List   Diagnosis Date Noted   Gastric intestinal metaplasia    Leg pain, bilateral 07/22/2021   Sensory polyneuropathy 07/22/2021   Intestinal metaplasia of gastric mucosa 05/20/2021   Senile nuclear sclerosis 12/02/2020   Restless legs syndrome 12/02/2020   Osteoarthritis of hip 12/02/2020   Myopia 12/02/2020   Esophageal reflux 12/02/2020   Hearing loss 12/02/2020   Actinic keratosis 12/02/2020   Chronic obstructive lung disease (HCC) 12/02/2020   Chronic neck pain 12/02/2020   Other ill-defined and unknown causes of morbidity and mortality 12/02/2020   Neurocardiogenic pre-syncope 10/13/2020   Dietary B12 deficiency 05/14/2020   Headache disorder 04/13/2020   Other diseases of stomach and duodenum    Hx of transient ischemic attack (TIA) 01/28/2020   Elevated left ventricular end-diastolic pressure (LVEDP) 06/01/2019   Moderate aortic  regurgitation 06/01/2019   Carotid artery plaque, left 07/09/2018   Sick sinus syndrome (HCC) 03/09/2017   Anosmia 05/05/2016   Dysphagia 02/03/2016   Tobacco use disorder, moderate, in sustained remission 03/11/2015   Lumbar disc herniation with radiculopathy 03/11/2015   Degeneration of intervertebral disc 03/10/2015   BPH with obstruction/lower urinary tract symptoms 03/10/2015   Degenerative arthritis of finger 03/10/2015   Post-traumatic stress disorder 03/10/2015   Lattice degeneration 03/10/2015   Leg varices 03/10/2015    Allergies  Allergen Reactions   Prazosin     Other reaction(s): Dizziness    Past Surgical History:  Procedure Laterality Date   CATARACT EXTRACTION Right 2015   CATARACT EXTRACTION W/PHACO Left 10/21/2015   Procedure: CATARACT EXTRACTION PHACO AND INTRAOCULAR LENS PLACEMENT (IOC);  Surgeon: Lockie Mola, MD;  Location: [facility];  Service: Ophthalmology;  Laterality: Left;   CATARACT EXTRACTION, BILATERAL  2016   COLONOSCOPY  2014   benign polyps   COLONOSCOPY WITH PROPOFOL N/A 03/10/2020   Procedure: COLONOSCOPY WITH PROPOFOL;  Surgeon: Midge Minium, MD;  Location: [facility] ENDOSCOPY;  Service: Endoscopy;  Laterality: N/A;  PRIORITY 4   COLONOSCOPY WITH PROPOFOL N/A 04/26/2022   Procedure: COLONOSCOPY WITH PROPOFOL;  Surgeon: Midge Minium, MD;  Location: [facility] ENDOSCOPY;  Service: Endoscopy;  Laterality: N/A;   CYSTOSCOPY  ESOPHAGEAL MANOMETRY N/A 08/03/2016   Procedure: ESOPHAGEAL MANOMETRY (EM);  Surgeon: Napoleon Form, MD;  Location: [facility] ENDOSCOPY;  Service: Endoscopy;  Laterality: N/A;   ESOPHAGOGASTRODUODENOSCOPY     ESOPHAGOGASTRODUODENOSCOPY N/A 04/26/2022   Procedure: ESOPHAGOGASTRODUODENOSCOPY (EGD);  Surgeon: Midge Minium, MD;  Location: [facility] ENDOSCOPY;  Service: Endoscopy;  Laterality: N/A;   ESOPHAGOGASTRODUODENOSCOPY (EGD) WITH PROPOFOL N/A 02/17/2016   with esoph dilatation   ESOPHAGOGASTRODUODENOSCOPY (EGD) WITH PROPOFOL  N/A 03/10/2020   Procedure: ESOPHAGOGASTRODUODENOSCOPY (EGD) WITH PROPOFOL;  Surgeon: Midge Minium, MD;  Location: [facility] ENDOSCOPY;  Service: Endoscopy;  Laterality: N/A;   TONSILLECTOMY      Social History   Tobacco Use   Smoking status: Former    Current packs/day: 0.00    Average packs/day: 0.5 packs/day for 20.0 years (10.0 ttl pk-yrs)    Types: Cigarettes, Pipe, Cigars    Start date: 04/05/1966    Quit date: 04/05/1986    Years since quitting: 37.7   Smokeless tobacco: Never   Tobacco comments:    smoking cessation materials not required  Vaping Use   Vaping status: Never Used  Substance Use Topics   Alcohol use: Yes    Alcohol/week: 6.0 standard drinks of alcohol    Types: 6 Standard drinks or equivalent per week    Comment: socially   Drug use: No     Medication list has been reviewed and updated.  No outpatient medications have been marked as taking for the 12/11/23 encounter (Orders Only) with Reubin Milan, MD.       05/30/2023    9:25 AM 03/06/2023    9:58 AM 07/20/2022   11:44 AM 05/26/2022    8:28 AM  GAD 7 : Generalized Anxiety Score  Nervous, Anxious, on Edge 1 1 1 1   Control/stop worrying 1 1 1 1   Worry too much - different things 1 0 1 1  Trouble relaxing 1 1 0 2  Restless 2 2 2 2   Easily annoyed or irritable 2 2 2 2   Afraid - awful might happen 0 1 1 1   Total GAD 7 Score 8 8 8 10   Anxiety Difficulty Somewhat difficult Somewhat difficult Not difficult at all Very difficult       05/30/2023    9:25 AM 03/23/2023    8:59 AM 03/06/2023    9:58 AM  Depression screen PHQ 2/9  Decreased Interest 1 1 1   Down, Depressed, Hopeless 1 1 1   PHQ - 2 Score 2 2 2   Altered sleeping 2 1 2   Tired, decreased energy 2 1 3   Change in appetite 1 0 3  Feeling bad or failure about yourself  1 0 1  Trouble concentrating 2 0 2  Moving slowly or fidgety/restless 1 0 1  Suicidal thoughts 0 0 0  PHQ-9 Score 11 4 14   Difficult doing work/chores Somewhat difficult Not  difficult at all Somewhat difficult    BP Readings from Last 3 Encounters:  05/30/23 126/70  03/06/23 102/64  12/27/22 128/74    Physical Exam  Wt Readings from Last 3 Encounters:  05/30/23 167 lb (75.8 kg)  03/23/23 165 lb (74.8 kg)  03/06/23 165 lb 3.2 oz (74.9 kg)    There were no vitals taken for this visit.  Assessment and Plan:  Problem List Items Addressed This Visit   None   No follow-ups on file.    Reubin Milan, MD [facility] [place]

## 2023-12-11 NOTE — Telephone Encounter (Signed)
 Patient needs referral for Neurology sent to Dr. Carlynn Purl and a referral for Urology to Dr. Marvel Plan. His insurance changed to HMO so a referral will have to be placed even though he see's them already.

## 2023-12-11 NOTE — Telephone Encounter (Signed)
 Copied from CRM [id number]. Topic: Referral - Question >> Dec 11, 2023 11:42 AM Alcus Dad wrote: Reason for CRM: Needs referral for insurance. Insurance changed to [facility] and it requires a referral

## 2023-12-20 DIAGNOSIS — R42 Dizziness and giddiness: Secondary | ICD-10-CM | POA: Diagnosis not present

## 2023-12-20 DIAGNOSIS — R5383 Other fatigue: Secondary | ICD-10-CM | POA: Diagnosis not present

## 2023-12-26 NOTE — Progress Notes (Unsigned)
 12/27/2023 1:15 PM   Timothy Scott 04/18/1946 [id number]  Referring provider: Sheron Dixons, MD [address] [place],  [place] [address]  Urological history: 1. BPH w/ LU TS -PSA (05/2023) 0.19 -cysto (2019) -  Enlarged prostate visually obstructive ~5 cm in length - Mild trabeculation with small saccules in the posterior well - Retroflexion shows intravesical lobe noted -cysto (2021) - Marked lateral lobe enlargement of prostate gland, hypervascular - Moderate bladder neck elevation - moderate trabeculation with cellules - Retroflexion shows intravesical median lobe.  -prostate volume ~45 cc  2. High risk hematuria -former smoker -CTU (2019) - Mild prostate gland enlargement with mild circumferential wall thickening of the urinary bladder. -RUS (2021) - No acute or focal renal abnormality identified. Right extrarenal pelvis again noted.  No bladder distention. Bladder wall is again noted to be diffusely thickened at 0.7 cm. Prostate is prominent and irregular  -cysto (2019) -  Enlarged prostate Visually obstructive ~5 cm in length - Mild trabeculation with small saccules in the posterior well - Retroflexion shows intravesical lobe noted -cysto (2021) - Marked lateral lobe enlargement of prostate gland, hypervascular - Moderate bladder neck elevation - moderate trabeculation with cellules - Retroflexion shows intravesical median lobe.  -CTU (06/2022) - prostatomegaly and bladder wall thickening w/ numerous diverticula   3.  Incomplete bladder emptying -previous PVR's ~ 200 cc   No chief complaint on file.   HPI: Timothy Scott is a 78 y.o. man who presents today for three month follow up after restarting tamsulosin and finasteride .  Previous records reviewed.     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    Lattice degeneration of peripheral retina    Leg varices 03/10/2015   Osteoarthrosis, unspecified whether generalized or localized, hand    Polyp of transverse colon    Posttraumatic stress disorder    Sinus bradycardia 04/17/2014   Varicose veins of bilateral lower extremities with other complications    Wears dentures    partial upper and lower    Surgical History: Past Surgical History:  Procedure Laterality Date   CATARACT EXTRACTION Right 2015   CATARACT EXTRACTION W/PHACO Left 10/21/2015   Procedure: CATARACT EXTRACTION PHACO AND INTRAOCULAR LENS PLACEMENT (IOC);  Surgeon: Annell Kidney, MD;  Location: [facility];  Service: Ophthalmology;  Laterality: Left;   CATARACT EXTRACTION, BILATERAL  2016   COLONOSCOPY  2014   benign polyps   COLONOSCOPY WITH PROPOFOL  N/A 03/10/2020   Procedure: COLONOSCOPY WITH PROPOFOL ;  Surgeon: Marnee Sink, MD;  Location: [facility] ENDOSCOPY;  Service: Endoscopy;  Laterality: N/A;  PRIORITY 4   COLONOSCOPY WITH PROPOFOL  N/A 04/26/2022   Procedure: COLONOSCOPY WITH PROPOFOL ;  Surgeon: Marnee Sink, MD;  Location: [facility] ENDOSCOPY;  Service: Endoscopy;  Laterality: N/A;   CYSTOSCOPY     ESOPHAGEAL MANOMETRY N/A 08/03/2016   Procedure: ESOPHAGEAL MANOMETRY (EM);  Surgeon: Sergio Dandy, MD;  Location: [facility] ENDOSCOPY;  Service: Endoscopy;  Laterality: N/A;   ESOPHAGOGASTRODUODENOSCOPY     ESOPHAGOGASTRODUODENOSCOPY N/A 04/26/2022  Procedure: ESOPHAGOGASTRODUODENOSCOPY (EGD);  Surgeon: Marnee Sink, MD;  Location: [facility] ENDOSCOPY;  Service: Endoscopy;  Laterality: N/A;   ESOPHAGOGASTRODUODENOSCOPY (EGD) WITH PROPOFOL  N/A 02/17/2016   with esoph dilatation   ESOPHAGOGASTRODUODENOSCOPY (EGD) WITH PROPOFOL  N/A 03/10/2020    Procedure: ESOPHAGOGASTRODUODENOSCOPY (EGD) WITH PROPOFOL ;  Surgeon: Marnee Sink, MD;  Location: [facility] ENDOSCOPY;  Service: Endoscopy;  Laterality: N/A;   TONSILLECTOMY      Home Medications:  Allergies as of 12/27/2023       Reactions   Prazosin    Other reaction(s): Dizziness        Medication List        Accurate as of December 26, 2023  1:15 PM. If you have any questions, ask your nurse or doctor.          CALCIUM  1200 PO Take 1 tablet by mouth daily.   cyanocobalamin  1000 MCG tablet Take by mouth.   DAILY HERBS PROSTATE PO Take 1 tablet by mouth daily.   finasteride  5 MG tablet Commonly known as: PROSCAR  TAKE ONE TABLET BY MOUTH EVERY DAY FOR BENIGN PROSTATIC HYPERPLASIA   glucosamine-chondroitin 500-400 MG tablet Take 1 tablet by mouth daily.   naproxen 500 MG tablet Commonly known as: NAPROSYN Take 500 mg by mouth 2 (two) times daily as needed for mild pain or moderate pain.   omeprazole 40 MG capsule Commonly known as: PRILOSEC Take 40 mg by mouth daily.   tamsulosin 0.4 MG Caps capsule Commonly known as: FLOMAX Take 0.4 mg by mouth as needed.   traZODone  100 MG tablet Commonly known as: DESYREL  TAKE TWO TABLETS BY MOUTH AT BEDTIME FOR SLEEP. (YOU MAY TAKE A 3RD TABLET IF NEEDED, IF YOU WAKE UP DURING THE NIGHT)   ZINC PO Take by mouth daily.        Allergies:  Allergies  Allergen Reactions   Prazosin     Other reaction(s): Dizziness    Family History: Family History  Problem Relation Age of Onset   Hypertension Mother    Cancer Mother        breast   Aneurysm Mother    Healthy Sister    Prostate cancer Neg Hx    Kidney cancer Neg Hx    Bladder Cancer Neg Hx     Social History:  reports that he quit smoking about 37 years ago. His smoking use included cigarettes, pipe, and cigars. He started smoking about 57 years ago. He has a 10 pack-year smoking history. He has never used smokeless tobacco. He reports current alcohol use of  about 6.0 standard drinks of alcohol per week. He reports that he does not use drugs.  ROS: Pertinent ROS in HPI  Physical Exam: There were no vitals taken for this visit.  Constitutional:  Well nourished. Alert and oriented, No acute distress. HEENT: [place] AT, moist mucus membranes.  Trachea midline, no masses. Cardiovascular: No clubbing, cyanosis, or edema. Respiratory: Normal respiratory effort, no increased work of breathing. GI: Abdomen is soft, non tender, non distended, no abdominal masses. Liver and spleen not palpable.  No hernias appreciated.  Stool sample for occult testing is not indicated.   GU: No CVA tenderness.  No bladder fullness or masses.  Patient with circumcised/uncircumcised phallus. ***Foreskin easily retracted***  Urethral meatus is patent.  No penile discharge. No penile lesions or rashes. Scrotum without lesions, cysts, rashes and/or edema.  Testicles are located scrotally bilaterally. No masses are appreciated in the testicles. Left and right epididymis are normal. Rectal: Patient with  normal sphincter tone. Anus and perineum without scarring or rashes. No rectal masses are appreciated. Prostate is approximately *** grams, *** nodules are appreciated. Seminal vesicles are normal. Skin: No rashes, bruises or suspicious lesions. Lymph: No cervical or inguinal adenopathy. Neurologic: Grossly intact, no focal deficits, moving all 4 extremities. Psychiatric: Normal mood and affect.   Laboratory Data: Lipid Panel     Component Value Date/Time   CHOL 163 05/30/2023 0941   CHOL 149 05/09/2019 1032   TRIG 84 05/30/2023 0941   HDL 63 05/30/2023 0941   HDL 60 05/09/2019 1032   CHOLHDL 2.6 05/30/2023 0941   VLDL 17 05/30/2023 0941   LDLCALC 83 05/30/2023 0941   LDLCALC 61 05/09/2019 1032   LABVLDL 28 05/09/2019 1032    CMP     Component Value Date/Time   NA 140 05/30/2023 0941   NA 143 05/09/2019 1032   K 4.4 05/30/2023 0941   CL 106 05/30/2023 0941   CO2 28  05/30/2023 0941   GLUCOSE 102 (H) 05/30/2023 0941   BUN 21 05/30/2023 0941   BUN 17 05/09/2019 1032   CREATININE 0.84 05/30/2023 0941   CALCIUM  9.1 05/30/2023 0941   PROT 6.4 (L) 05/30/2023 0941   PROT 6.6 05/09/2019 1032   ALBUMIN 4.0 05/30/2023 0941   ALBUMIN 4.5 05/09/2019 1032   AST 18 05/30/2023 0941   ALT 17 05/30/2023 0941   ALKPHOS 35 (L) 05/30/2023 0941   BILITOT 0.5 05/30/2023 0941   BILITOT 0.4 05/09/2019 1032   GFRNONAA >60 05/30/2023 0941    CBC    Component Value Date/Time   WBC 8.3 05/30/2023 0941   RBC 3.93 (L) 05/30/2023 0941   HGB 13.4 05/30/2023 0941   HGB 14.2 05/09/2019 1032   HCT 38.6 (L) 05/30/2023 0941   HCT 42.3 05/09/2019 1032   PLT 255 05/30/2023 0941   PLT 320 05/09/2019 1032   MCV 98.2 05/30/2023 0941   MCV 98 (H) 05/09/2019 1032   MCH 34.1 (H) 05/30/2023 0941   MCHC 34.7 05/30/2023 0941   RDW 12.6 05/30/2023 0941   RDW 12.0 05/09/2019 1032   LYMPHSABS 1.0 05/30/2023 0941   LYMPHSABS 1.1 05/09/2019 1032   MONOABS 0.8 05/30/2023 0941   EOSABS 0.1 05/30/2023 0941   EOSABS 0.1 05/09/2019 1032   BASOSABS 0.0 05/30/2023 0941   BASOSABS 0.1 05/09/2019 1032    Component     Latest Ref Rng 05/30/2023  Prostatic Specific Antigen     0.00 - 4.00 ng/mL 0.19   I have reviewed the labs.  See HPI.      Pertinent Imaging: ***    Assessment & Plan:    1. BPH w/ LU TS -most bothersome symptoms are nocturia -continue conservative management, avoiding bladder irritants and timed voiding's -at goal with tamsulosin and finasteride   2. High risk hematuria -Former smoker -Work-up in 2021 -negative for malignant findings -CTU (06/2022) - prostatomegaly and bladder diverticula  -No reports of gross heme -UA ***  3. Nocturia -not bothersome at this time   No follow-ups on file.  These notes generated with voice recognition software. I apologize for typographical errors.  Briant Camper  [facility] [address]  [address] [place], [place] [address] [phone]

## 2023-12-27 ENCOUNTER — Ambulatory Visit (INDEPENDENT_AMBULATORY_CARE_PROVIDER_SITE_OTHER): Payer: Self-pay | Admitting: Urology

## 2023-12-27 ENCOUNTER — Encounter: Payer: Self-pay | Admitting: Urology

## 2023-12-27 VITALS — BP 139/68 | HR 60 | Ht 71.5 in | Wt 166.4 lb

## 2023-12-27 DIAGNOSIS — R319 Hematuria, unspecified: Secondary | ICD-10-CM

## 2023-12-27 DIAGNOSIS — N401 Enlarged prostate with lower urinary tract symptoms: Secondary | ICD-10-CM

## 2023-12-27 DIAGNOSIS — R351 Nocturia: Secondary | ICD-10-CM

## 2023-12-27 DIAGNOSIS — N138 Other obstructive and reflux uropathy: Secondary | ICD-10-CM

## 2023-12-27 LAB — MICROSCOPIC EXAMINATION

## 2023-12-27 LAB — URINALYSIS, COMPLETE
Bilirubin, UA: NEGATIVE
Glucose, UA: NEGATIVE
Ketones, UA: NEGATIVE
Nitrite, UA: NEGATIVE
Protein,UA: NEGATIVE
RBC, UA: NEGATIVE
Specific Gravity, UA: 1.02 (ref 1.005–1.030)
Urobilinogen, Ur: 0.2 mg/dL (ref 0.2–1.0)
pH, UA: 6 (ref 5.0–7.5)

## 2023-12-27 LAB — BLADDER SCAN AMB NON-IMAGING

## 2023-12-27 MED ORDER — FINASTERIDE 5 MG PO TABS: 5.0000 mg | ORAL_TABLET | Freq: Every day | ORAL | 3 refills | Status: AC

## 2023-12-30 LAB — CULTURE, URINE COMPREHENSIVE

## 2024-02-12 ENCOUNTER — Ambulatory Visit: Attending: Neurology

## 2024-02-12 DIAGNOSIS — R262 Difficulty in walking, not elsewhere classified: Secondary | ICD-10-CM | POA: Diagnosis not present

## 2024-02-12 DIAGNOSIS — R42 Dizziness and giddiness: Secondary | ICD-10-CM | POA: Diagnosis not present

## 2024-02-12 DIAGNOSIS — R2681 Unsteadiness on feet: Secondary | ICD-10-CM | POA: Diagnosis not present

## 2024-02-12 NOTE — Therapy (Signed)
 OUTPATIENT PHYSICAL THERAPY VESTIBULAR EVALUATION     Patient Name: Timothy Scott MRN: [id number] DOB:1945/09/10, 78 y.o., male Today's Date: 02/12/2024  END OF SESSION:  PT End of Session - 02/12/24 1113     Visit Number 1    Number of Visits 25    Date for PT Re-Evaluation 05/06/24    PT Start Time 0846    PT Stop Time 0934    PT Time Calculation (min) 48 min    Activity Tolerance Patient tolerated treatment well    Behavior During Therapy [facility] for tasks assessed/performed             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    Lattice degeneration of peripheral retina    Leg varices 03/10/2015   Osteoarthrosis, unspecified whether generalized or localized, hand    Polyp of transverse colon    Posttraumatic stress disorder    Sinus bradycardia 04/17/2014   Varicose veins of bilateral lower extremities with other complications    Wears dentures    partial upper and lower   Past Surgical History:  Procedure Laterality Date   CATARACT EXTRACTION Right 2015   CATARACT EXTRACTION W/PHACO Left 10/21/2015   Procedure: CATARACT EXTRACTION PHACO AND INTRAOCULAR LENS PLACEMENT (IOC);  Surgeon: Annell Kidney, MD;  Location: [facility];  Service: Ophthalmology;  Laterality: Left;   CATARACT EXTRACTION, BILATERAL  2016   COLONOSCOPY  2014   benign polyps   COLONOSCOPY WITH PROPOFOL  N/A 03/10/2020   Procedure: COLONOSCOPY WITH PROPOFOL ;  Surgeon: Marnee Sink, MD;  Location: [facility];  Service: Endoscopy;  Laterality: N/A;  PRIORITY 4   COLONOSCOPY WITH PROPOFOL  N/A 04/26/2022   Procedure: COLONOSCOPY WITH PROPOFOL ;  Surgeon: Marnee Sink, MD;  Location: [facility];  Service:  Endoscopy;  Laterality: N/A;   CYSTOSCOPY     ESOPHAGEAL MANOMETRY N/A 08/03/2016   Procedure: ESOPHAGEAL MANOMETRY (EM);  Surgeon: Sergio Dandy, MD;  Location: [facility];  Service: Endoscopy;  Laterality: N/A;   ESOPHAGOGASTRODUODENOSCOPY     ESOPHAGOGASTRODUODENOSCOPY N/A 04/26/2022   Procedure: ESOPHAGOGASTRODUODENOSCOPY (EGD);  Surgeon: Marnee Sink, MD;  Location: [facility]COPY;  Service: Endoscopy;  Laterality: N/A;   ESOPHAGOGASTRODUODENOSCOPY (EGD) WITH PROPOFOL  N/A 02/17/2016   with esoph dilatation   ESOPHAGOGASTRODUODENOSCOPY (EGD) WITH PROPOFOL  N/A 03/10/2020   Procedure: ESOPHAGOGASTRODUODENOSCOPY (EGD) WITH PROPOFOL ;  Surgeon: Marnee Sink, MD;  Location: [facility] END[facility]COPY;  Service: Endoscopy;  Laterality: N/A;   TONSILLECTOMY     Patient Active Problem List   Diagnosis Date Noted   Globus sensation 12/11/2023   Gastric intestinal metaplasia    Leg pain, bilateral 07/22/2021   Sensory polyneuropathy 07/22/2021   Intestinal metaplasia of gastric mucosa 05/20/2021   Senile nuclear sclerosis 12/02/2020   Restless legs syndrome 12/02/2020   Osteoarthritis of hip 12/02/2020   Myopia 12/02/2020   Esophageal reflux 12/02/2020   Hearing loss 12/02/2020   Actinic keratosis 12/02/2020   Chronic obstructive lung disease (HCC) 12/02/2020   Chronic neck pain 12/02/2020   Other ill-defined and unknown causes of morbidity and mortality 12/02/2020   Neurocardiogenic pre-syncope 10/13/2020   Dietary B12 deficiency 05/14/2020   Headache disorder 04/13/2020   Other diseases of stomach and duodenum  Hx of transient ischemic attack (TIA) 01/28/2020   Elevated left ventricular end-diastolic pressure (LVEDP) 06/01/2019   Moderate aortic regurgitation 06/01/2019   Carotid artery plaque, left 07/09/2018   Sick sinus syndrome (HCC) 03/09/2017   Anosmia 05/05/2016   Dysphagia 02/03/2016   Tobacco use disorder, moderate, in sustained remission 03/11/2015   Lumbar disc herniation  with radiculopathy 03/11/2015   Degeneration of intervertebral disc 03/10/2015   BPH with obstruction/lower urinary tract symptoms 03/10/2015   Degenerative arthritis of finger 03/10/2015   Post-traumatic stress disorder 03/10/2015   Lattice degeneration 03/10/2015   Leg varices 03/10/2015    PCP: Sheron Dixons, MD REFERRING PROVIDER: Bufford Carne, MD  REFERRING DIAG:  Diagnosis  R42 (ICD-10-CM) - Dizziness and giddiness    THERAPY DIAG:  Dizziness and giddiness  Unsteadiness on feet  Difficulty in walking, not elsewhere classified  ONSET DATE: 3 months ago (around March 2025)  Rationale for Evaluation and Treatment: Rehabilitation  SUBJECTIVE:   SUBJECTIVE STATEMENT: The pt is a pleasant 78 y/o male presenting to PT for dizziness and imbalance He reports onset of symptoms approx 3 months ago: dizziness, lightheadedness, "zero energy," and general fatigue. At times he feels he cannot focus his eyes. He reports decreased balance. He reports no falls but some close-calls when walking or when on stairs/steps.He at times feels his head is spinning around. The world can look like it's spinning quite often. He does not think rolling causes spinning. Bending does not cause spinning, but causes lightheadedness he reports lightheadedness and spinning are sort of the same thing for him). Tilting his head back can cause symptoms. Turning his head quickly side to side can cause symptoms. Symptoms can happen even at rest when pt is sitting and perfectly still.  He reports rare ear fullness, rarely hsa headaches, occ ear ringing (bilaterally) . Pt reports halter monitor did not show anything abnormal.   Pt accompanied by: self  PERTINENT HISTORY:   PMH includes chronic airway obstruction, DDD, degeneration of cervical intervertebral disc, dysphagia, HLD, intervertebral disc disorder of lumbar region with myelopathy, lattice degeneration of peripheral retina, OA, PTSD, sinus  bradycardia, carotid artery plaque, left, hx TIA, B12 deficiency, tobacco use disorder, anosmia, sick sinus syndrome, hearing loss, chronic neck pain, sensory polyneuropathy, please refer to chart for full details  PAIN:  Are you having pain? Pt reports no major pains, seeing podiatrist for some R toe pain. Hx of throat pain but says they never could figure out cause   PRECAUTIONS: Fall  RED FLAGS: None   WEIGHT BEARING RESTRICTIONS: No  FALLS: Has patient fallen in last 6 months? No  LIVING ENVIRONMENT: Lives with: lives alone Lives in: House/apartment Stairs: 2 steps (no handrails), one level home  Has following equipment at home: Crutches and SPC but not currently using   PLOF: Independent  PATIENT GOALS: wants to improve lightheadedness, weakness, visual focus, balance and dizziness   OBJECTIVE:  Note: Objective measures were completed at Evaluation unless otherwise noted.  DIAGNOSTIC FINDINGS:  via chart  MR BRAIN 05/05/23: "FINDINGS: Brain: No acute infarction, hemorrhage, hydrocephalus, extra-axial collection or mass lesion. Mild to moderate for age scattered T2/FLAIR hyperintensities in the white matter, nonspecific but compatible with chronic microvascular ischemic change.   Vascular: Major arterial flow voids are maintained at the skull base.   Skull and upper cervical spine: Normal marrow signal.   Sinuses/Orbits: Negative.   Other: No mastoid effusions.   IMPRESSION: No evidence of acute intracranial abnormality.  Electronically Signed   By: Stevenson Elbe M.D.   On: 05/05/2023 14:31"  US  carotid 03/14/23: "IMPRESSION: 1. Minimal to moderate amount of left-sided atherosclerotic plaque, morphologically similar to the 09/2018 examination and again not resulting in a hemodynamically significant stenosis. 2. Very minimal amount of right-sided intimal thickening/atherosclerotic plaque, morphologically similar to the 09/2018 examination and again not  resulting in a hemodynamically significant stenosis.     Electronically Signed   By: Robbi Childs M.D.   On: 03/14/2023 16:35"   COGNITION: Overall cognitive status: Within functional limits for tasks assessed   SENSATION: Pt reports no numbness, but some tingling in his legs that he has off and on for several months. Formal assessment not completed at this time  EDEMA:  Reports rarely gets some swelling in his legs (has worked with vascular, reports he was told he has "bad veins," and was instructed to wear compression socks)   POSTURE:  rounded shoulders and forward head  Cervical ROM:    Quick cervical screen completed, some minor pain with end range motion with cervical flexion but all other movements pt demo's sufficient AROM for testing that is pain-free     BED MOBILITY:  Impaired per DHI. In session did demo lightheadedness with supine>sit and sit>stand   TRANSFERS: Assistive device utilized: None  Sit to stand: Complete Independence Stand to sit: Complete Independence Chair to chair: Complete Independence *does at times require extra time standing near chair due to lightheaded feeling/imbalance prior to ambulating     GAIT: Gait pattern: per observation of pt ambulating in and out of clinic pt with variability in BOS, mostly utilizes wider BOS, must briefly stop upon first standing due to symptoms. Distance walked: clinic distances Comments: I suspect scanning is likely impaired with gait as pt does report quick head turns can cause symptoms  FUNCTIONAL TESTS:  Dynamic Gait Index: deferred  PATIENT SURVEYS:  DHI 30  VESTIBULAR ASSESSMENT:   OCULOMOTOR EXAM:  Ocular Alignment: normal  Ocular ROM: some limitations with L eye with end-range gaze to L   Spontaneous Nystagmus: absent  Gaze-Induced Nystagmus: absent  Smooth Pursuits: saccadic throughout, most notable with vertical gaze   Saccades: corrective saccade consistent with L gaze - pt notes this  makes him slightly dizzy  Convergence/Divergence: sees double about 4" away; L eye has difficulty converging/does not converge, R eye slightly converges   VESTIBULAR - OCULAR REFLEX: deferred to future visit  Slow VOR:   VOR Cancellation:   Head-Impulse Test:   Dynamic Visual Acuity:    POSITIONAL TESTING:  Room light: Dix-Hallpike R and L no nystagmus present, but pt does feel consistently lightheaded/imbalance in each testing position, no reported vertigo in testing positions. Pt lightheaded with feeling of self-spinning upon sitting up but still no nystagmus present. Roll Test: negative bilaterally - lightheaded with sitting back up  OTHOSTATICS: plan to assess next 1-2 visits  FUNCTIONAL GAIT: No formal assessment completed (DGI deferred), but per observation of pt ambulating in and out of clinic pt with variability in BOS, mostly utilizes wider BOS, must briefly stop upon first standing due to symptoms.  TREATMENT DATE:   SELF CARE Reviewed findings of assessment with pt, purpose of testing, indications of findings on prognosis & plan. PT recommends follow-up with eye doc due to L eye findings. Pt reports he has upcoming visit (annual)  PATIENT EDUCATION: Education details: Reviewed findings of assessment with pt, purpose of testing, indications of findings on prognosis & plan. PT recommends follow-up with eye doc due to L eye findings. Pt reports he has upcoming visit (annual) Person educated: Patient Education method: Explanation Education comprehension: verbalized understanding  HOME EXERCISE PROGRAM:  GOALS: Goals reviewed with patient? Yes initiated     SHORT TERM GOALS: Target date: 03/25/2024   Patient will be independent in home exercise program to improve balance, strength and mobility for better functional independence with ADLs. Baseline:  to be initiated next 1-2 visits Goal status: INITIAL   LONG TERM GOALS: Target date: 05/06/2024    Patient will reduce dizziness handicap inventory score to <20, for less dizziness with ADLs and increased safety with home and work tasks.  Baseline: 30 Goal status: INITIAL   2.  Patient will increase ABC scale score >80% to demonstrate better functional mobility and better confidence with ADLs.  Baseline: to be assessed Goal status: INITIAL  3.  Patient will increase dynamic gait index score to >19/24 as to demonstrate reduced fall risk and improved dynamic gait balance for better safety with community/home ambulation.   Baseline: to be assessed Goal status: INITIAL  4. Pt will demonstrate ability to go from supine>sit without spinning symptoms to indicate improved ease with bed mobility and safety.   Baseline: consistently makes pt lightheaded/feel self-spinning  Goal status: INITIAL   ASSESSMENT:  CLINICAL IMPRESSION: Patient is a pleasant 78 y.o. male who was seen today for physical therapy evaluation and treatment for dizziness and imbalance. Pt with no observable nystagmus or vertigo symptoms when in Dix-Hallpike or Roll test positions, but did report lightheadedness and feelings of imbalance. Pt consistently with lightheaded feeling and reports of self-spinning when going from supine>sit, but with no observable nystagmus. Pt with some abnormal findings on oculomotor exam: notably L eye had difficulty with convergence, horizontal end-gaze to L and pt with corrective saccade with L gaze and saccadic smooth pursuits, all of which indicate possible central contributors to dizziness. PT did also advise follow-up with eye doctor.  Pt's consistent lightheadedness throughout eval also warrants orthostatic assessment next visit. The pt will benefit from further skilled PT to improve imbalance and dizziness to increase ease and safety with ADLs and decrease fall risk.  OBJECTIVE IMPAIRMENTS:  Abnormal gait, decreased balance, difficulty walking, decreased strength, dizziness, impaired sensation, improper body mechanics, postural dysfunction, and pain.   ACTIVITY LIMITATIONS: bending, sitting, standing, stairs, transfers, bed mobility, and locomotion level  PARTICIPATION LIMITATIONS: meal prep, cleaning, shopping, community activity, and yard work  PERSONAL FACTORS: Age and 3+ comorbidities: PMH includes chronic airway obstruction, DDD, degeneration of cervical intervertebral disc, dysphagia, HLD, intervertebral disc disorder of lumbar region with myelopathy, lattice degeneration of peripheral retina, OA, PTSD, sinus bradycardia, carotid artery plaque, left, hx TIA, B12 deficiency, tobacco use disorder, anosmia, sick sinus syndrome, hearing loss, chronic neck pain, sensory polyneuropathy, please refer to chart for full details are also affecting patient's functional outcome.   REHAB POTENTIAL: Good  CLINICAL DECISION MAKING: Evolving/moderate complexity  EVALUATION COMPLEXITY: Moderate   PLAN:  PT FREQUENCY: 1-2x/week  PT DURATION: 12 weeks  PLANNED INTERVENTIONS: 97164- PT Re-evaluation, 97750- Physical Performance Testing, 97110-Therapeutic exercises, 97530- Therapeutic activity, [id number]- Neuromuscular  re-education, [phone]- Self Care, 62130- Manual therapy, [phone]- Gait training, [phone]- Orthotic Initial, [phone]- Orthotic/Prosthetic subsequent, [phone]- Canalith repositioning, [id number]- Splinting, [phone]- Electrical stimulation (manual), Patient/Family education, Balance training, Stair training, Taping, Joint mobilization, Spinal mobilization, Vestibular training, Visual/preceptual remediation/compensation, DME instructions, Wheelchair mobility training, Cryotherapy, and Moist heat  PLAN FOR NEXT SESSION: Orthostatics, DGI, initiate HEP, assessment with vestibular goggles if time   Samie Crews, PT 02/12/2024, 11:36 AM

## 2024-02-15 ENCOUNTER — Ambulatory Visit

## 2024-02-19 ENCOUNTER — Ambulatory Visit

## 2024-02-19 DIAGNOSIS — R262 Difficulty in walking, not elsewhere classified: Secondary | ICD-10-CM

## 2024-02-19 DIAGNOSIS — R42 Dizziness and giddiness: Secondary | ICD-10-CM

## 2024-02-19 DIAGNOSIS — R2681 Unsteadiness on feet: Secondary | ICD-10-CM

## 2024-02-19 NOTE — Therapy (Signed)
 OUTPATIENT PHYSICAL THERAPY VESTIBULAR TREATMENT     Patient Name: Timothy Scott MRN: [id number] DOB:[birth year], 78 y.o., male Today's Date: 02/19/2024  END OF SESSION:  PT End of Session - 02/19/24 0806     Visit Number 2    Number of Visits 25    Date for PT Re-Evaluation 05/06/24    PT Start Time 0805    PT Stop Time 0845    PT Time Calculation (min) 40 min    Equipment Utilized During Treatment Gait belt    Activity Tolerance Patient tolerated treatment well    Behavior During Therapy WFL for tasks assessed/performed           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    Lattice degeneration of peripheral retina    Leg varices 03/10/2015   Osteoarthrosis, unspecified whether generalized or localized, hand    Polyp of transverse colon    Posttraumatic stress disorder    Sinus bradycardia 04/17/2014   Varicose veins of bilateral lower extremities with other complications    Wears dentures    partial upper and lower   Past Surgical History:  Procedure Laterality Date   CATARACT EXTRACTION Right 2015   CATARACT EXTRACTION W/PHACO Left 10/21/2015   Procedure: CATARACT EXTRACTION PHACO AND INTRAOCULAR LENS PLACEMENT (IOC);  Surgeon: Annell Kidney, MD;  Location: [facility];  Service: Ophthalmology;  Laterality: Left;   CATARACT EXTRACTION, BILATERAL  2016   COLONOSCOPY  2014   benign polyps   COLONOSCOPY WITH PROPOFOL  N/A 03/10/2020   Procedure: COLONOSCOPY WITH PROPOFOL ;  Surgeon: Marnee Sink, MD;  Location: [facility] ENDOSCOPY;  Service: Endoscopy;  Laterality: N/A;  PRIORITY 4   COLONOSCOPY WITH PROPOFOL  N/A 04/26/2022   Procedure: COLONOSCOPY WITH PROPOFOL ;  Surgeon: Marnee Sink, MD;  Location: [facility] ENDOSCOPY;  Service: Endoscopy;  Laterality: N/A;   CYSTOSCOPY     ESOPHAGEAL MANOMETRY N/A 08/03/2016   Procedure: ESOPHAGEAL MANOMETRY (EM);  Surgeon: Sergio Dandy, MD;  Location: [facility] ENDOSCOPY;  Service: Endoscopy;  Laterality: N/A;   ESOPHAGOGASTRODUODENOSCOPY     ESOPHAGOGASTRODUODENOSCOPY N/A 04/26/2022   Procedure: ESOPHAGOGASTRODUODENOSCOPY (EGD);  Surgeon: Marnee Sink, MD;  Location: [facility] ENDOSCOPY;  Service: Endoscopy;  Laterality: N/A;   ESOPHAGOGASTRODUODENOSCOPY (EGD) WITH PROPOFOL  N/A 02/17/2016   with esoph dilatation   ESOPHAGOGASTRODUODENOSCOPY (EGD) WITH PROPOFOL  N/A 03/10/2020   Procedure: ESOPHAGOGASTRODUODENOSCOPY (EGD) WITH PROPOFOL ;  Surgeon: Marnee Sink, MD;  Location: [facility] ENDOSCOPY;  Service: Endoscopy;  Laterality: N/A;   TONSILLECTOMY     Patient Active Problem List   Diagnosis Date Noted   Globus sensation 12/11/2023   Gastric intestinal metaplasia    Leg pain, bilateral 07/22/2021   Sensory polyneuropathy 07/22/2021   Intestinal metaplasia of gastric mucosa 05/20/2021   Senile nuclear sclerosis 12/02/2020   Restless legs syndrome 12/02/2020   Osteoarthritis of hip 12/02/2020   Myopia 12/02/2020   Esophageal reflux 12/02/2020   Hearing loss 12/02/2020   Actinic keratosis 12/02/2020   Chronic obstructive lung disease (HCC) 12/02/2020   Chronic neck pain 12/02/2020   Other ill-defined and unknown causes of morbidity and mortality 12/02/2020   Neurocardiogenic pre-syncope 10/13/2020   Dietary B12 deficiency 05/14/2020   Headache disorder 04/13/2020  Other diseases of stomach and duodenum    Hx of transient ischemic attack (TIA) 01/28/2020   Elevated left ventricular end-diastolic pressure (LVEDP) 06/01/2019   Moderate aortic regurgitation 06/01/2019   Carotid artery plaque, left 07/09/2018   Sick sinus syndrome (HCC) 03/09/2017   Anosmia 05/05/2016   Dysphagia 02/03/2016   Tobacco use disorder, moderate, in sustained  remission 03/11/2015   Lumbar disc herniation with radiculopathy 03/11/2015   Degeneration of intervertebral disc 03/10/2015   BPH with obstruction/lower urinary tract symptoms 03/10/2015   Degenerative arthritis of finger 03/10/2015   Post-traumatic stress disorder 03/10/2015   Lattice degeneration 03/10/2015   Leg varices 03/10/2015    PCP: Sheron Dixons, MD REFERRING PROVIDER: Bufford Carne, MD  REFERRING DIAG:  Diagnosis  R42 (ICD-10-CM) - Dizziness and giddiness    THERAPY DIAG:  No diagnosis found.  ONSET DATE: 3 months ago (around March 2025)  Rationale for Evaluation and Treatment: Rehabilitation  SUBJECTIVE:   SUBJECTIVE STATEMENT: Pt reports things seem a bit out of balance. He reports continued lightheaded feeling.  Pt accompanied by: self  PERTINENT HISTORY:  From eval:  The pt is a pleasant 78 y/o male presenting to PT for dizziness and imbalance He reports onset of symptoms approx 3 months ago: dizziness, lightheadedness, zero energy, and general fatigue. At times he feels he cannot focus his eyes. He reports decreased balance. He reports no falls but some close-calls when walking or when on stairs/steps.He at times feels his head is spinning around. The world can look like it's spinning quite often. He does not think rolling causes spinning. Bending does not cause spinning, but causes lightheadedness he reports lightheadedness and spinning are sort of the same thing for him). Tilting his head back can cause symptoms. Turning his head quickly side to side can cause symptoms. Symptoms can happen even at rest when pt is sitting and perfectly still.  He reports rare ear fullness, rarely hsa headaches, occ ear ringing (bilaterally) . Pt reports halter monitor did not show anything abnormal.   PMH includes chronic airway obstruction, DDD, degeneration of cervical intervertebral disc, dysphagia, HLD, intervertebral disc disorder of lumbar region with myelopathy,  lattice degeneration of peripheral retina, OA, PTSD, sinus bradycardia, carotid artery plaque, left, hx TIA, B12 deficiency, tobacco use disorder, anosmia, sick sinus syndrome, hearing loss, chronic neck pain, sensory polyneuropathy, please refer to chart for full details  PAIN:  Are you having pain? Pt reports no major pains, seeing podiatrist for some R toe pain. Hx of throat pain but says they never could figure out cause   PRECAUTIONS: Fall  RED FLAGS: None   WEIGHT BEARING RESTRICTIONS: No  FALLS: Has patient fallen in last 6 months? No  LIVING ENVIRONMENT: Lives with: lives alone Lives in: House/apartment Stairs: 2 steps (no handrails), one level home  Has following equipment at home: Crutches and SPC but not currently using   PLOF: Independent  PATIENT GOALS: wants to improve lightheadedness, weakness, visual focus, balance and dizziness   OBJECTIVE:  Note: Objective measures were completed at Evaluation unless otherwise noted.  DIAGNOSTIC FINDINGS:  via chart  MR BRAIN 05/05/23: FINDINGS: Brain: No acute infarction, hemorrhage, hydrocephalus, extra-axial collection or mass lesion. Mild to moderate for age scattered T2/FLAIR hyperintensities in the white matter, nonspecific but compatible with chronic microvascular ischemic change.   Vascular: Major arterial flow voids are maintained at the skull base.   Skull and upper cervical spine: Normal marrow signal.   Sinuses/Orbits: Negative.   Other:  No mastoid effusions.   IMPRESSION: No evidence of acute intracranial abnormality.     Electronically Signed   By: Stevenson Elbe M.D.   On: 05/05/2023 14:31  US  carotid 03/14/23: IMPRESSION: 1. Minimal to moderate amount of left-sided atherosclerotic plaque, morphologically similar to the 09/2018 examination and again not resulting in a hemodynamically significant stenosis. 2. Very minimal amount of right-sided intimal thickening/atherosclerotic plaque,  morphologically similar to the 09/2018 examination and again not resulting in a hemodynamically significant stenosis.     Electronically Signed   By: Robbi Childs M.D.   On: 03/14/2023 16:35   COGNITION: Overall cognitive status: Within functional limits for tasks assessed   SENSATION: Pt reports no numbness, but some tingling in his legs that he has off and on for several months. Formal assessment not completed at this time  EDEMA:  Reports rarely gets some swelling in his legs (has worked with vascular, reports he was told he has bad veins, and was instructed to wear compression socks)   POSTURE:  rounded shoulders and forward head  Cervical ROM:    Quick cervical screen completed, some minor pain with end range motion with cervical flexion but all other movements pt demo's sufficient AROM for testing that is pain-free     BED MOBILITY:  Impaired per DHI. In session did demo lightheadedness with supine>sit and sit>stand   TRANSFERS: Assistive device utilized: None  Sit to stand: Complete Independence Stand to sit: Complete Independence Chair to chair: Complete Independence *does at times require extra time standing near chair due to lightheaded feeling/imbalance prior to ambulating     GAIT: Gait pattern: per observation of pt ambulating in and out of clinic pt with variability in BOS, mostly utilizes wider BOS, must briefly stop upon first standing due to symptoms. Distance walked: clinic distances Comments: I suspect scanning is likely impaired with gait as pt does report quick head turns can cause symptoms  FUNCTIONAL TESTS:  Dynamic Gait Index: deferred  PATIENT SURVEYS:  DHI 30  VESTIBULAR ASSESSMENT:   OCULOMOTOR EXAM:  Ocular Alignment: normal  Ocular ROM: some limitations with L eye with end-range gaze to L   Spontaneous Nystagmus: absent  Gaze-Induced Nystagmus: absent  Smooth Pursuits: saccadic throughout, most notable with vertical gaze    Saccades: corrective saccade consistent with L gaze - pt notes this makes him slightly dizzy  Convergence/Divergence: sees double about 4 away; L eye has difficulty converging/does not converge, R eye slightly converges   02/19/24: DVA: POSITIVE Change from 7 to line 4    VESTIBULAR - OCULAR REFLEX: 02/19/24:  Slow VOR: WNL  VOR Cancellation: movement WNL but reports issues with moving background  Head-Impulse Test: positive bilaterally   Dynamic Visual Acuity:    POSITIONAL TESTING:  Room light: Dix-Hallpike R and L no nystagmus present, but pt does feel consistently lightheaded/imbalance in each testing position, no reported vertigo in testing positions. Pt lightheaded with feeling of self-spinning upon sitting up but still no nystagmus present. Roll Test: negative bilaterally - lightheaded with sitting back up  OTHOSTATICS: Orthostatics- 02/19/24 Supine: 114/66 mmHg HR 60  bpm Seated: 118/65 mmHg HR 63 bpm  Standing 132/67 mmHg HR 60 bpm   FUNCTIONAL GAIT: No formal assessment completed (DGI deferred), but per observation of pt ambulating in and out of clinic pt with variability in BOS, mostly utilizes wider BOS, must briefly stop upon first standing due to symptoms.  TREATMENT DATE:   Physical Performance: PT instructed pt in DGI. See below for results. Demonstrates increased fall risk with score of 20/24. (<19 indicates increased fall risk)   [facility] PT Assessment - 02/19/24 0001       Standardized Balance Assessment   Standardized Balance Assessment Dynamic Gait Index      Dynamic Gait Index   Level Surface Normal    Change in Gait Speed Normal    Gait with Horizontal Head Turns Mild Impairment    Gait with Vertical Head Turns Mild Impairment    Gait and Pivot Turn Mild Impairment    Step Over Obstacle Normal    Step Around Obstacles Normal     Steps Mild Impairment    Total Score 20           NMR: Orthostatics- Supine: 114/66 mmHg HR 60  bpm Seated: 118/65 mmHg HR 63 bpm  Standing 132/67 mmHg HR 60 bpm   VESTIBULAR - OCULAR REFLEX:   Slow VOR: WNL  VOR Cancellation: movement WNL but reports issues with moving background  Head-Impulse Test: positive bilaterally   DVA: POSITIVE Change from 7 to line 4    Reviewed pt's HEP in session; pt completes sets/reps as prescribed below to confirm technique Access Code: [email or web address] URL: [email or web address] Date: 02/19/2024 Prepared by: Aminta Kales  Exercises - Seated Gaze Stabilization with Head Rotation  - 1 x daily - 7 x weekly - 4 sets - 1 reps - 30 seconds hold   PATIENT EDUCATION: Education details: Reviewed findings of assessment with pt, purpose of testing, indications of findings on prognosis & plan. PT recommends follow-up with eye doc due to L eye findings. Pt reports he has upcoming visit (annual) Person educated: Patient Education method: Explanation Education comprehension: verbalized understanding  HOME EXERCISE PROGRAM:  GOALS: Goals reviewed with patient? Yes initiated     SHORT TERM GOALS: Target date: 04/01/2024   Patient will be independent in home exercise program to improve balance, strength and mobility for better functional independence with ADLs. Baseline: to be initiated next 1-2 visits Goal status: INITIAL   LONG TERM GOALS: Target date: 05/13/2024    Patient will reduce dizziness handicap inventory score to <20, for less dizziness with ADLs and increased safety with home and work tasks.  Baseline: 30 Goal status: INITIAL   2.  Patient will increase ABC scale score >80% to demonstrate better functional mobility and better confidence with ADLs.  Baseline: to be assessed Goal status: INITIAL  3.  Patient will increase dynamic gait index score to >19/24 as to demonstrate reduced fall risk and improved dynamic gait balance  for better safety with community/home ambulation.   Baseline: 20/24 Goal status: INITIAL  4. Pt will demonstrate ability to go from supine>sit without spinning symptoms to indicate improved ease with bed mobility and safety.   Baseline: consistently makes pt lightheaded/feel self-spinning  Goal status: INITIAL   ASSESSMENT:  CLINICAL IMPRESSION: Further assessment complete. Pt current findings are suggestive of gaze stabilization impairment that is likely impacting balance/symptoms. PT initiated HEP to address this deficit. It would be beneficial to further investigate if visual motion sensitivity is also a component. Orthostatics were WNL. The pt will benefit from further skilled PT to improve imbalance and dizziness to increase ease and safety with ADLs and decrease fall risk.  OBJECTIVE IMPAIRMENTS: Abnormal gait, decreased balance, difficulty walking, decreased strength, dizziness, impaired sensation, improper body mechanics, postural dysfunction, and pain.   ACTIVITY LIMITATIONS: bending, sitting, standing, stairs, transfers,  bed mobility, and locomotion level  PARTICIPATION LIMITATIONS: meal prep, cleaning, shopping, community activity, and yard work  PERSONAL FACTORS: Age and 3+ comorbidities: PMH includes chronic airway obstruction, DDD, degeneration of cervical intervertebral disc, dysphagia, HLD, intervertebral disc disorder of lumbar region with myelopathy, lattice degeneration of peripheral retina, OA, PTSD, sinus bradycardia, carotid artery plaque, left, hx TIA, B12 deficiency, tobacco use disorder, anosmia, sick sinus syndrome, hearing loss, chronic neck pain, sensory polyneuropathy, please refer to chart for full details are also affecting patient's functional outcome.   REHAB POTENTIAL: Good  CLINICAL DECISION MAKING: Evolving/moderate complexity  EVALUATION COMPLEXITY: Moderate   PLAN:  PT FREQUENCY: 1-2x/week  PT DURATION: 12 weeks  PLANNED INTERVENTIONS: [id number]-  PT Re-evaluation, 97750- Physical Performance Testing, 97110-Therapeutic exercises, 97530- Therapeutic activity, [id number]- Neuromuscular re-education, 97535- Self Care, 16109- Manual therapy, [id number]- Gait training, [phone]- Orthotic Initial, [phone]- Orthotic/Prosthetic subsequent, [phone]- Canalith repositioning, [phone]- Splinting, [phone]- Electrical stimulation (manual), Patient/Family education, Balance training, Stair training, Taping, Joint mobilization, Spinal mobilization, Vestibular training, Visual/preceptual remediation/compensation, DME instructions, Wheelchair mobility training, Cryotherapy, and Moist heat  PLAN FOR NEXT SESSION: VOR training, gaze stabilization, assessment with vestibular goggles if time   Samie Crews, PT 02/19/2024, 12:28 PM

## 2024-02-22 ENCOUNTER — Ambulatory Visit

## 2024-02-22 DIAGNOSIS — R42 Dizziness and giddiness: Secondary | ICD-10-CM

## 2024-02-22 DIAGNOSIS — R2681 Unsteadiness on feet: Secondary | ICD-10-CM

## 2024-02-22 NOTE — Therapy (Signed)
 OUTPATIENT PHYSICAL THERAPY VESTIBULAR TREATMENT     Patient Name: Timothy Scott MRN: [id number] DOB:November 04, 1945, 78 y.o., male Today's Date: 02/22/2024  END OF SESSION:  PT End of Session - 02/22/24 0801     Visit Number 3    Number of Visits 25    Date for PT Re-Evaluation 05/06/24    PT Start Time 0806    PT Stop Time 0846    PT Time Calculation (min) 40 min    Equipment Utilized During Treatment Gait belt    Activity Tolerance Patient tolerated treatment well    Behavior During Therapy WFL for tasks assessed/performed           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    Lattice degeneration of peripheral retina    Leg varices 03/10/2015   Osteoarthrosis, unspecified whether generalized or localized, hand    Polyp of transverse colon    Posttraumatic stress disorder    Sinus bradycardia 04/17/2014   Varicose veins of bilateral lower extremities with other complications    Wears dentures    partial upper and lower   Past Surgical History:  Procedure Laterality Date   CATARACT EXTRACTION Right 2015   CATARACT EXTRACTION W/PHACO Left 10/21/2015   Procedure: CATARACT EXTRACTION PHACO AND INTRAOCULAR LENS PLACEMENT (IOC);  Surgeon: Annell Kidney, MD;  Location: [facility];  Service: Ophthalmology;  Laterality: Left;   CATARACT EXTRACTION, BILATERAL  2016   COLONOSCOPY  2014   benign polyps   COLONOSCOPY WITH PROPOFOL  N/A 03/10/2020   Procedure: COLONOSCOPY WITH PROPOFOL ;  Surgeon: Marnee Sink, MD;  Location: [facility] END[facility]COPY;  Service: Endoscopy;  Laterality: N/A;  PRIORITY 4   COLONOSCOPY WITH PROPOFOL  N/A 04/26/2022   Procedure: COLONOSCOPY WITH PROPOFOL ;  Surgeon: Marnee Sink, MD;  Location: [facility] ENDOSCOPY;  Service: Endoscopy;  Laterality: N/A;   CYSTOSCOPY     ESOPHAGEAL MANOMETRY N/A 08/03/2016   Procedure: ESOPHAGEAL MANOMETRY (EM);  Surgeon: Sergio Dandy, MD;  Location: [facility];  Service: Endoscopy;  Laterality: N/A;   ESOPHAGOGASTRODUODENOSCOPY     ESOPHAGOGASTRODUODENOSCOPY N/A 04/26/2022   Procedure: ESOPHAGOGASTRODUODENOSCOPY (EGD);  Surgeon: Marnee Sink, MD;  Location: [facility]COPY;  Service: Endoscopy;  Laterality: N/A;   ESOPHAGOGASTRODUODENOSCOPY (EGD) WITH PROPOFOL  N/A 02/17/2016   with esoph dilatation   ESOPHAGOGASTRODUODENOSCOPY (EGD) WITH PROPOFOL  N/A 03/10/2020   Procedure: ESOPHAGOGASTRODUODENOSCOPY (EGD) WITH PROPOFOL ;  Surgeon: Marnee Sink, MD;  Location: [facility] ENDOSCOPY;  Service: Endoscopy;  Laterality: N/A;   TONSILLECTOMY     Patient Active Problem List   Diagnosis Date Noted   Globus sensation 12/11/2023   Gastric intestinal metaplasia    Leg pain, bilateral 07/22/2021   Sensory polyneuropathy 07/22/2021   Intestinal metaplasia of gastric mucosa 05/20/2021   Senile nuclear sclerosis 12/02/2020   Restless legs syndrome 12/02/2020   Osteoarthritis of hip 12/02/2020   Myopia 12/02/2020   Esophageal reflux 12/02/2020   Hearing loss 12/02/2020   Actinic keratosis 12/02/2020   Chronic obstructive lung disease (HCC) 12/02/2020   Chronic neck pain 12/02/2020   Other ill-defined and unknown causes of morbidity and mortality 12/02/2020   Neurocardiogenic pre-syncope 10/13/2020   Dietary B12 deficiency 05/14/2020   Headache disorder 04/13/2020  Other diseases of stomach and duodenum    Hx of transient ischemic attack (TIA) 01/28/2020   Elevated left ventricular end-diastolic pressure (LVEDP) 06/01/2019   Moderate aortic regurgitation 06/01/2019   Carotid artery plaque, left 07/09/2018   Sick sinus syndrome (HCC) 03/09/2017   Anosmia 05/05/2016   Dysphagia 02/03/2016   Tobacco use disorder, moderate, in sustained  remission 03/11/2015   Lumbar disc herniation with radiculopathy 03/11/2015   Degeneration of intervertebral disc 03/10/2015   BPH with obstruction/lower urinary tract symptoms 03/10/2015   Degenerative arthritis of finger 03/10/2015   Post-traumatic stress disorder 03/10/2015   Lattice degeneration 03/10/2015   Leg varices 03/10/2015    PCP: Sheron Dixons, MD REFERRING PROVIDER: Bufford Carne, MD  REFERRING DIAG:  Diagnosis  R42 (ICD-10-CM) - Dizziness and giddiness    THERAPY DIAG:  Dizziness and giddiness  Unsteadiness on feet  ONSET DATE: 3 months ago (around March 2025)  Rationale for Evaluation and Treatment: Rehabilitation  SUBJECTIVE:   SUBJECTIVE STATEMENT: No updates from last visit. Pt completing HEP Pt accompanied by: self  PERTINENT HISTORY:  From eval:  The pt is a pleasant 78 y/o male presenting to PT for dizziness and imbalance He reports onset of symptoms approx 3 months ago: dizziness, lightheadedness, zero energy, and general fatigue. At times he feels he cannot focus his eyes. He reports decreased balance. He reports no falls but some close-calls when walking or when on stairs/steps.He at times feels his head is spinning around. The world can look like it's spinning quite often. He does not think rolling causes spinning. Bending does not cause spinning, but causes lightheadedness he reports lightheadedness and spinning are sort of the same thing for him). Tilting his head back can cause symptoms. Turning his head quickly side to side can cause symptoms. Symptoms can happen even at rest when pt is sitting and perfectly still.  He reports rare ear fullness, rarely hsa headaches, occ ear ringing (bilaterally) . Pt reports halter monitor did not show anything abnormal.   PMH includes chronic airway obstruction, DDD, degeneration of cervical intervertebral disc, dysphagia, HLD, intervertebral disc disorder of lumbar region with myelopathy, lattice  degeneration of peripheral retina, OA, PTSD, sinus bradycardia, carotid artery plaque, left, hx TIA, B12 deficiency, tobacco use disorder, anosmia, sick sinus syndrome, hearing loss, chronic neck pain, sensory polyneuropathy, please refer to chart for full details  PAIN:  Are you having pain? Pt reports no major pains, seeing podiatrist for some R toe pain. Hx of throat pain but says they never could figure out cause   PRECAUTIONS: Fall  RED FLAGS: None   WEIGHT BEARING RESTRICTIONS: No  FALLS: Has patient fallen in last 6 months? No  LIVING ENVIRONMENT: Lives with: lives alone Lives in: House/apartment Stairs: 2 steps (no handrails), one level home  Has following equipment at home: Crutches and SPC but not currently using   PLOF: Independent  PATIENT GOALS: wants to improve lightheadedness, weakness, visual focus, balance and dizziness   OBJECTIVE:  Note: Objective measures were completed at Evaluation unless otherwise noted.  DIAGNOSTIC FINDINGS:  via chart  MR BRAIN 05/05/23: FINDINGS: Brain: No acute infarction, hemorrhage, hydrocephalus, extra-axial collection or mass lesion. Mild to moderate for age scattered T2/FLAIR hyperintensities in the white matter, nonspecific but compatible with chronic microvascular ischemic change.   Vascular: Major arterial flow voids are maintained at the skull base.   Skull and upper cervical spine: Normal marrow signal.   Sinuses/Orbits: Negative.   Other: No mastoid effusions.  IMPRESSION: No evidence of acute intracranial abnormality.     Electronically Signed   By: Stevenson Elbe M.D.   On: 05/05/2023 14:31  US  carotid 03/14/23: IMPRESSION: 1. Minimal to moderate amount of left-sided atherosclerotic plaque, morphologically similar to the 09/2018 examination and again not resulting in a hemodynamically significant stenosis. 2. Very minimal amount of right-sided intimal thickening/atherosclerotic plaque,  morphologically similar to the 09/2018 examination and again not resulting in a hemodynamically significant stenosis.     Electronically Signed   By: Robbi Childs M.D.   On: 03/14/2023 16:35   COGNITION: Overall cognitive status: Within functional limits for tasks assessed   SENSATION: Pt reports no numbness, but some tingling in his legs that he has off and on for several months. Formal assessment not completed at this time  EDEMA:  Reports rarely gets some swelling in his legs (has worked with vascular, reports he was told he has bad veins, and was instructed to wear compression socks)   POSTURE:  rounded shoulders and forward head  Cervical ROM:    Quick cervical screen completed, some minor pain with end range motion with cervical flexion but all other movements pt demo's sufficient AROM for testing that is pain-free     BED MOBILITY:  Impaired per DHI. In session did demo lightheadedness with supine>sit and sit>stand   TRANSFERS: Assistive device utilized: None  Sit to stand: Complete Independence Stand to sit: Complete Independence Chair to chair: Complete Independence *does at times require extra time standing near chair due to lightheaded feeling/imbalance prior to ambulating     GAIT: Gait pattern: per observation of pt ambulating in and out of clinic pt with variability in BOS, mostly utilizes wider BOS, must briefly stop upon first standing due to symptoms. Distance walked: clinic distances Comments: I suspect scanning is likely impaired with gait as pt does report quick head turns can cause symptoms  FUNCTIONAL TESTS:  Dynamic Gait Index: deferred  PATIENT SURVEYS:  DHI 30  VESTIBULAR ASSESSMENT:   OCULOMOTOR EXAM:  Ocular Alignment: normal  Ocular ROM: some limitations with L eye with end-range gaze to L   Spontaneous Nystagmus: absent  Gaze-Induced Nystagmus: absent  Smooth Pursuits: saccadic throughout, most notable with vertical gaze    Saccades: corrective saccade consistent with L gaze - pt notes this makes him slightly dizzy  Convergence/Divergence: sees double about 4 away; L eye has difficulty converging/does not converge, R eye slightly converges   02/19/24: DVA: POSITIVE Change from 7 to line 4    VESTIBULAR - OCULAR REFLEX: 02/19/24:  Slow VOR: WNL  VOR Cancellation: movement WNL but reports issues with moving background  Head-Impulse Test: positive bilaterally   Dynamic Visual Acuity:    POSITIONAL TESTING:  Room light: Dix-Hallpike R and L no nystagmus present, but pt does feel consistently lightheaded/imbalance in each testing position, no reported vertigo in testing positions. Pt lightheaded with feeling of self-spinning upon sitting up but still no nystagmus present. Roll Test: negative bilaterally - lightheaded with sitting back up  OTHOSTATICS: Orthostatics- 02/19/24 Supine: 114/66 mmHg HR 60  bpm Seated: 118/65 mmHg HR 63 bpm  Standing 132/67 mmHg HR 60 bpm   FUNCTIONAL GAIT: No formal assessment completed (DGI deferred), but per observation of pt ambulating in and out of clinic pt with variability in BOS, mostly utilizes wider BOS, must briefly stop upon first standing due to symptoms.  TREATMENT DATE:   NMR: Gaze stabilization: seated VORx1 with horizontal head turns 4x30 sec VORx1 with vertical head turns 4x30 sec   HEP updated with imaginary target interventions x multiple reps completed in session: Access Code: [email or web address] URL: [email or web address] Date: 02/22/2024 Prepared by: Aminta Kales  Exercises - Seated Gaze Stabilization with Head Rotation  - 1 x daily - 7 x weekly - 4 sets - 1 reps - 30 seconds hold - Imaginary Target with Head Rotation  - 1 x daily - 7 x weekly - 2 sets - 20 reps - Imaginary Target with Head Nod  - 1 x daily - 7 x weekly -  2 sets - 20 reps    Modified Motion Sensitivity Test  Movement Intensity (change from baseline, 0-10, no change=0, severe change=10) Duration  <5 sec = 0  5-10 s = 1 11-20 s = 2 21-30 s = 3 >30 s = 4 Score (Intensity + Duration)  5x Horizontal head turns 0 0   5x Vertical head turns 1 0   5x Right diagonal head turns (upper left quadrant down to right) 0 0   5x Left diagonal head turns (upper right quadrant down to left) 0 0   5x Trunk Bends (bending knees reaching to floor) 0.5  0   5x Right quarter body urns (look over right shoulder with trunk rotation, feet planted) 1 0   5x Left quarter body turns (look over left shoulder with trunk rotation, feet planted) 0 0   1x 360 degree turn to right 1.5 3   1x 360 degree turn to left 0 0   5x VOR cancellation (follow thumbs horizontally with head/trunk rotation x45 degrees each way) 2 1   TOTAL SCORE      MSQ = total score x (# of positions)/14   0-10 mild range 11-30 moderate range 31-100 severe range   3.6 mild    Bending habituation activity 2x5, CGA  Standing WBOS vertical head turns 2x5 --repeats NBOS 2x5  With CGA    PATIENT EDUCATION: Education details: exercise technique, HEP Person educated: Patient Education method: Explanation Education comprehension: verbalized understanding  HOME EXERCISE PROGRAM:  GOALS: Goals reviewed with patient? Yes initiated     SHORT TERM GOALS: Target date: 04/04/2024   Patient will be independent in home exercise program to improve balance, strength and mobility for better functional independence with ADLs. Baseline: to be initiated next 1-2 visits Goal status: INITIAL   LONG TERM GOALS: Target date: 05/16/2024    Patient will reduce dizziness handicap inventory score to <20, for less dizziness with ADLs and increased safety with home and work tasks.  Baseline: 30 Goal status: INITIAL   2.  Patient will increase ABC scale score >80% to demonstrate better functional  mobility and better confidence with ADLs.  Baseline: to be assessed Goal status: INITIAL  3.  Patient will increase dynamic gait index score to >19/24 as to demonstrate reduced fall risk and improved dynamic gait balance for better safety with community/home ambulation.   Baseline: 20/24 Goal status: INITIAL  4. Pt will demonstrate ability to go from supine>sit without spinning symptoms to indicate improved ease with bed mobility and safety.   Baseline: consistently makes pt lightheaded/feel self-spinning  Goal status: INITIAL   ASSESSMENT:  CLINICAL IMPRESSION: HEP updated to further progress habituation activities with comprehensive instruction during visit, but pt would likely benefit from further instruction in technique future visits. Pt also completed MMST with findings of mild motion sensitivity.  The pt will benefit from further skilled PT to improve imbalance and dizziness to increase ease and safety with ADLs and decrease fall risk.  OBJECTIVE IMPAIRMENTS: Abnormal gait, decreased balance, difficulty walking, decreased strength, dizziness, impaired sensation, improper body mechanics, postural dysfunction, and pain.   ACTIVITY LIMITATIONS: bending, sitting, standing, stairs, transfers, bed mobility, and locomotion level  PARTICIPATION LIMITATIONS: meal prep, cleaning, shopping, community activity, and yard work  PERSONAL FACTORS: Age and 3+ comorbidities: PMH includes chronic airway obstruction, DDD, degeneration of cervical intervertebral disc, dysphagia, HLD, intervertebral disc disorder of lumbar region with myelopathy, lattice degeneration of peripheral retina, OA, PTSD, sinus bradycardia, carotid artery plaque, left, hx TIA, B12 deficiency, tobacco use disorder, anosmia, sick sinus syndrome, hearing loss, chronic neck pain, sensory polyneuropathy, please refer to chart for full details are also affecting patient's functional outcome.   REHAB POTENTIAL: Good  CLINICAL  DECISION MAKING: Evolving/moderate complexity  EVALUATION COMPLEXITY: Moderate   PLAN:  PT FREQUENCY: 1-2x/week  PT DURATION: 12 weeks  PLANNED INTERVENTIONS: 97164- PT Re-evaluation, 97750- Physical Performance Testing, 97110-Therapeutic exercises, 97530- Therapeutic activity, [id number]- Neuromuscular re-education, 97535- Self Care, 16109- Manual therapy, [id number]- Gait training, [phone]- Orthotic Initial, [phone]- Orthotic/Prosthetic subsequent, [phone]- Canalith repositioning, [phone]- Splinting, [phone]- Electrical stimulation (manual), Patient/Family education, Balance training, Stair training, Taping, Joint mobilization, Spinal mobilization, Vestibular training, Visual/preceptual remediation/compensation, DME instructions, Wheelchair mobility training, Cryotherapy, and Moist heat  PLAN FOR NEXT SESSION: VOR training, gaze stabilization, assessment with vestibular goggles if time, ABC   Samie Crews, PT 02/22/2024, 8:53 AM

## 2024-02-26 ENCOUNTER — Ambulatory Visit

## 2024-02-26 DIAGNOSIS — R2681 Unsteadiness on feet: Secondary | ICD-10-CM

## 2024-02-26 DIAGNOSIS — R42 Dizziness and giddiness: Secondary | ICD-10-CM | POA: Diagnosis not present

## 2024-02-26 DIAGNOSIS — R262 Difficulty in walking, not elsewhere classified: Secondary | ICD-10-CM

## 2024-02-26 NOTE — Therapy (Signed)
 OUTPATIENT PHYSICAL THERAPY TREATMENT  Patient Name: Timothy Scott MRN: [id number] DOB:[birth year], 78 y.o., male Today's Date: 02/26/2024  END OF SESSION:  PT End of Session - 02/26/24 0849     Visit Number 4    Number of Visits 25    Date for PT Re-Evaluation 05/06/24    PT Start Time 0847    PT Stop Time 0927    PT Time Calculation (min) 40 min    Equipment Utilized During Treatment Gait belt    Activity Tolerance Patient tolerated treatment well;No increased pain    Behavior During Therapy WFL for tasks assessed/performed           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    Lattice degeneration of peripheral retina    Leg varices 03/10/2015   Osteoarthrosis, unspecified whether generalized or localized, hand    Polyp of transverse colon    Posttraumatic stress disorder    Sinus bradycardia 04/17/2014   Varicose veins of bilateral lower extremities with other complications    Wears dentures    partial upper and lower   Past Surgical History:  Procedure Laterality Date   CATARACT EXTRACTION Right 2015   CATARACT EXTRACTION W/PHACO Left 10/21/2015   Procedure: CATARACT EXTRACTION PHACO AND INTRAOCULAR LENS PLACEMENT (IOC);  Surgeon: Dene Etienne, MD;  Location: [facility];  Service: Ophthalmology;  Laterality: Left;   CATARACT EXTRACTION, BILATERAL  2016   COLONOSCOPY  2014   benign polyps   COLONOSCOPY WITH PROPOFOL  N/A 03/10/2020   Procedure: COLONOSCOPY WITH PROPOFOL ;  Surgeon: Jinny Carmine, MD;  Location: [facility] ENDOSCOPY;  Service: Endoscopy;  Laterality: N/A;  PRIORITY 4   COLONOSCOPY WITH PROPOFOL  N/A 04/26/2022   Procedure: COLONOSCOPY WITH PROPOFOL ;  Surgeon: Jinny Carmine, MD;  Location: [facility] ENDOSCOPY;  Service: Endoscopy;  Laterality: N/A;   CYSTOSCOPY     ESOPHAGEAL MANOMETRY N/A 08/03/2016   Procedure: ESOPHAGEAL MANOMETRY (EM);  Surgeon: Gustav LULLA Mcgee, MD;  Location: [facility] ENDOSCOPY;  Service: Endoscopy;  Laterality: N/A;   ESOPHAGOGASTRODUODENOSCOPY     ESOPHAGOGASTRODUODENOSCOPY N/A 04/26/2022   Procedure: ESOPHAGOGASTRODUODENOSCOPY (EGD);  Surgeon: Jinny Carmine, MD;  Location: [facility] ENDOSCOPY;  Service: Endoscopy;  Laterality: N/A;   ESOPHAGOGASTRODUODENOSCOPY (EGD) WITH PROPOFOL  N/A 02/17/2016   with esoph dilatation   ESOPHAGOGASTRODUODENOSCOPY (EGD) WITH PROPOFOL  N/A 03/10/2020   Procedure: ESOPHAGOGASTRODUODENOSCOPY (EGD) WITH PROPOFOL ;  Surgeon: Jinny Carmine, MD;  Location: [facility] ENDOSCOPY;  Service: Endoscopy;  Laterality: N/A;   TONSILLECTOMY     Patient Active Problem List   Diagnosis Date Noted   Globus sensation 12/11/2023   Gastric intestinal metaplasia    Leg pain, bilateral 07/22/2021   Sensory polyneuropathy 07/22/2021   Intestinal metaplasia of gastric mucosa 05/20/2021   Senile nuclear sclerosis 12/02/2020   Restless legs syndrome 12/02/2020   Osteoarthritis of hip 12/02/2020   Myopia 12/02/2020   Esophageal reflux 12/02/2020   Hearing loss 12/02/2020   Actinic keratosis 12/02/2020   Chronic obstructive lung disease (HCC) 12/02/2020   Chronic neck pain 12/02/2020   Other ill-defined and unknown causes of morbidity and mortality 12/02/2020   Neurocardiogenic pre-syncope 10/13/2020   Dietary B12 deficiency 05/14/2020   Headache disorder 04/13/2020   Other  diseases of stomach and duodenum    Hx of transient ischemic attack (TIA) 01/28/2020   Elevated left ventricular end-diastolic pressure (LVEDP) 06/01/2019   Moderate aortic regurgitation 06/01/2019   Carotid artery plaque, left 07/09/2018   Sick sinus syndrome (HCC) 03/09/2017   Anosmia 05/05/2016   Dysphagia 02/03/2016   Tobacco use disorder, moderate, in sustained  remission 03/11/2015   Lumbar disc herniation with radiculopathy 03/11/2015   Degeneration of intervertebral disc 03/10/2015   BPH with obstruction/lower urinary tract symptoms 03/10/2015   Degenerative arthritis of finger 03/10/2015   Post-traumatic stress disorder 03/10/2015   Lattice degeneration 03/10/2015   Leg varices 03/10/2015    PCP: Justus Leita DEL, MD REFERRING PROVIDER: Lane Arthea BRAVO, MD  REFERRING DIAG:  Diagnosis  R42 (ICD-10-CM) - Dizziness and giddiness    THERAPY DIAG:  Dizziness and giddiness  Unsteadiness on feet  Difficulty in walking, not elsewhere classified  ONSET DATE: 3 months ago (around March 2025)  Rationale for Evaluation and Treatment: Rehabilitation  SUBJECTIVE:   SUBJECTIVE STATEMENT: No updates from last visit. Pt completing HEP. Horizontal head turns seem to be th emost helpful at home.  Pt accompanied by: self  PERTINENT HISTORY:  The pt is a pleasant 78 y/o male presenting to PT for dizziness and imbalance He reports onset of symptoms approx 3 months ago: dizziness, lightheadedness, zero energy, and general fatigue. At times he feels he cannot focus his eyes. He reports decreased balance. He reports no falls but some close-calls when walking or when on stairs/steps.He at times feels his head is spinning around. The world can look like it's spinning quite often. He does not think rolling causes spinning. Bending does not cause spinning, but causes lightheadedness he reports lightheadedness and spinning are sort of the same thing for him). Tilting his head back can cause symptoms. Turning his head quickly side to side can cause symptoms. Symptoms can happen even at rest when pt is sitting and perfectly still. He reports rare ear fullness, rarely hsa headaches, occ ear ringing (bilaterally) .Pt reports halter monitor did not show anything abnormal. PMH includes chronic airway obstruction, DDD, degeneration of cervical intervertebral disc,  dysphagia, HLD, intervertebral disc disorder of lumbar region with myelopathy, lattice degeneration of peripheral retina, OA, PTSD, sinus bradycardia, carotid artery plaque, left, hx TIA, B12 deficiency, tobacco use disorder, anosmia, sick sinus syndrome, hearing loss, chronic neck pain, sensory polyneuropathy, please refer to chart for full details  PAIN:  Are you having pain? Chornic Rt hip pain DJD (5/10)   PRECAUTIONS: Fall  RED FLAGS: None   WEIGHT BEARING RESTRICTIONS: No  FALLS: Has patient fallen in last 6 months? No  LIVING ENVIRONMENT: Lives with: lives alone Lives in: House/apartment Stairs: 2 steps (no handrails), one level home  Has following equipment at home: Crutches and SPC but not currently using   PLOF: Independent  PATIENT GOALS: wants to improve lightheadedness, weakness, visual focus, balance and dizziness   OBJECTIVE:  Note: Objective measures were completed at Evaluation unless otherwise noted.  DIAGNOSTIC FINDINGS:  via chart  MR BRAIN 05/05/23: FINDINGS: Brain: No acute infarction, hemorrhage, hydrocephalus, extra-axial collection or mass lesion. Mild to moderate for age scattered T2/FLAIR hyperintensities in the white matter, nonspecific but compatible with chronic microvascular ischemic change.   Vascular: Major arterial flow voids are maintained at the skull base.   Skull and upper cervical spine: Normal marrow signal.   Sinuses/Orbits: Negative.   Other: No mastoid effusions.   IMPRESSION: No evidence of acute  intracranial abnormality.    Electronically Signed   By: Gilmore GORMAN Molt M.D.   On: 05/05/2023 14:31  US  carotid 03/14/23: IMPRESSION: 1. Minimal to moderate amount of left-sided atherosclerotic plaque, morphologically similar to the 09/2018 examination and again not resulting in a hemodynamically significant stenosis. 2. Very minimal amount of right-sided intimal thickening/atherosclerotic plaque, morphologically similar  to the 09/2018 examination and again not resulting in a hemodynamically significant stenosis.     Electronically Signed   By: Norleen Roulette M.D.   On: 03/14/2023 16:35  COGNITION: Overall cognitive status: Within functional limits for tasks assessed   SENSATION: Pt reports no numbness, but some tingling in his legs that he has off and on for several months. Formal assessment not completed at this time  EDEMA:  Reports rarely gets some swelling in his legs (has worked with vascular, reports he was told he has bad veins, and was instructed to wear compression socks)  POSTURE:  rounded shoulders and forward head  Cervical ROM:   Quick cervical screen completed, some minor pain with end range motion with cervical flexion but all other movements pt demo's sufficient AROM for testing that is pain-free   BED MOBILITY:  Impaired per DHI. In session did demo lightheadedness with supine>sit and sit>stand   TRANSFERS: Assistive device utilized: None  Sit to stand: Complete Independence Stand to sit: Complete Independence Chair to chair: Complete Independence *does at times require extra time standing near chair due to lightheaded feeling/imbalance prior to ambulating   GAIT: Gait pattern: per observation of pt ambulating in and out of clinic pt with variability in BOS, mostly utilizes wider BOS, must briefly stop upon first standing due to symptoms. Distance walked: clinic distances Comments: I suspect scanning is likely impaired with gait as pt does report quick head turns can cause symptoms  FUNCTIONAL TESTS:  Dynamic Gait Index: deferred  PATIENT SURVEYS:  DHI 30  VESTIBULAR ASSESSMENT:   OCULOMOTOR EXAM:  Ocular Alignment: normal  Ocular ROM: some limitations with L eye with end-range gaze to L   Spontaneous Nystagmus: absent  Gaze-Induced Nystagmus: absent  Smooth Pursuits: saccadic throughout, most notable with vertical gaze   Saccades: corrective saccade consistent  with L gaze - pt notes this makes him slightly dizzy  Convergence/Divergence: sees double about 4 away; L eye has difficulty converging/does not converge, R eye slightly converges   02/19/24: DVA: POSITIVE Change from 7 to line 4    VESTIBULAR - OCULAR REFLEX: 02/19/24:  Slow VOR: WNL  VOR Cancellation: movement WNL but reports issues with moving background  Head-Impulse Test: positive bilaterally   Dynamic Visual Acuity:    POSITIONAL TESTING:  Room light: Dix-Hallpike R and L no nystagmus present, but pt does feel consistently lightheaded/imbalance in each testing position, no reported vertigo in testing positions. Pt lightheaded with feeling of self-spinning upon sitting up but still no nystagmus present. Roll Test: negative bilaterally - lightheaded with sitting back up  OTHOSTATICS: Orthostatics- 02/19/24 Supine: 114/66 mmHg HR 60  bpm Seated: 118/65 mmHg HR 63 bpm  Standing 132/67 mmHg HR 60 bpm   FUNCTIONAL GAIT: No formal assessment completed (DGI deferred), but per observation of pt ambulating in and out of clinic pt with variability in BOS, mostly utilizes wider BOS, must briefly stop upon first standing due to symptoms.  TREATMENT DATE 02/26/24 :   Gaze stabilization with horizontal head turns 4x30 sec Gaze stabilization with vertical head turns 4x30 sec   Standing horizontal head turns, eye tracking with ball rebound to side 1x25 bilat (supervision level)  Standing vertical head turns, eye tracking with ball rebound to over door frame 1x25 (supervision level)   Standing NBOS horizontal head turns to read and call out playing card x15 bilat  -AMB 77ft scanning for peripheral visual playing cards, head turn, calling out card twice each side (some difficulty with clubs v spaces)  *no significant dizziness in session, just some gait instability which  we attribute to chronic hip DJD  -backward walking with ball self toss/catch twice each  -backward walking with card draw and call twice each   PATIENT EDUCATION: Education details: exercise technique, HEP Person educated: Patient Education method: Explanation Education comprehension: verbalized understanding  HOME EXERCISE PROGRAM:  GOALS: Goals reviewed with patient? Yes initiated  SHORT TERM GOALS: Target date: 04/08/2024   Patient will be independent in home exercise program to improve balance, strength and mobility for better functional independence with ADLs. Baseline: to be initiated next 1-2 visits Goal status: INITIAL  LONG TERM GOALS: Target date: 05/20/2024    Patient will reduce dizziness handicap inventory score to <20, for less dizziness with ADLs and increased safety with home and work tasks.  Baseline: 30 Goal status: INITIAL  2.  Patient will increase ABC scale score >80% to demonstrate better functional mobility and better confidence with ADLs.  Baseline: to be assessed Goal status: INITIAL  3.  Patient will increase dynamic gait index score to >19/24 as to demonstrate reduced fall risk and improved dynamic gait balance for better safety with community/home ambulation.   Baseline: 20/24 Goal status: INITIAL  4. Pt will demonstrate ability to go from supine>sit without spinning symptoms to indicate improved ease with bed mobility and safety.   Baseline: consistently makes pt lightheaded/feel self-spinning  Goal status: INITIAL   ASSESSMENT:  CLINICAL IMPRESSION: Conitnued to work on vestibular interventions and transitioning to more balance actiivties. Pt has instability related to hip DJD, but frank dizziness is minimal during session. The pt will benefit from further skilled PT to improve imbalance and dizziness to increase ease and safety with ADLs and decrease fall risk.  OBJECTIVE IMPAIRMENTS: Abnormal gait, decreased balance, difficulty walking,  decreased strength, dizziness, impaired sensation, improper body mechanics, postural dysfunction, and pain.   ACTIVITY LIMITATIONS: bending, sitting, standing, stairs, transfers, bed mobility, and locomotion level  PARTICIPATION LIMITATIONS: meal prep, cleaning, shopping, community activity, and yard work  PERSONAL FACTORS: Age and 3+ comorbidities: PMH includes chronic airway obstruction, DDD, degeneration of cervical intervertebral disc, dysphagia, HLD, intervertebral disc disorder of lumbar region with myelopathy, lattice degeneration of peripheral retina, OA, PTSD, sinus bradycardia, carotid artery plaque, left, hx TIA, B12 deficiency, tobacco use disorder, anosmia, sick sinus syndrome, hearing loss, chronic neck pain, sensory polyneuropathy, please refer to chart for full details are also affecting patient's functional outcome.   REHAB POTENTIAL: Good  CLINICAL DECISION MAKING: Evolving/moderate complexity  EVALUATION COMPLEXITY: Moderate   PLAN:  PT FREQUENCY: 1-2x/week  PT DURATION: 12 weeks  PLANNED INTERVENTIONS: 97164- PT Re-evaluation, 97750- Physical Performance Testing, 97110-Therapeutic exercises, 97530- Therapeutic activity, [id number]- Neuromuscular re-education, 97535- Self Care, [id number]- Manual therapy, [id number]- Gait training, [phone]- Orthotic Initial, [phone]- Orthotic/Prosthetic subsequent, [phone]- Canalith repositioning, [id number]- Splinting, [phone]- Electrical stimulation (manual), Patient/Family education, Balance training, Stair training, Taping, Joint mobilization, Spinal mobilization, Vestibular training, Visual/preceptual remediation/compensation, DME instructions,  Wheelchair mobility training, Cryotherapy, and Moist heat  PLAN FOR NEXT SESSION: VOR training, gaze stabilization, referral to hand therapy, begging planning DC  9:22 AM, 02/26/24 Peggye JAYSON Linear, PT, DPT Physical Therapist - [facility]  Outpatient Physical Therapy- Main  [facility] [phone]     Grifton C, PT 02/26/2024, 8:52 AM

## 2024-02-28 ENCOUNTER — Telehealth: Payer: Self-pay

## 2024-02-28 ENCOUNTER — Ambulatory Visit

## 2024-02-28 NOTE — Telephone Encounter (Signed)
 PT contacted pt via secure line to return pt call from yesterday and follow-up about today's appointment. Pt listed on schedule in error/pt reports he did cancel appointment for today. PT additionally provides pt with information regarding assessment findings of L eye ROM/function to bring up at his upcoming ophthalmology appointment per pt request.  Darryle Patten PT, DPT

## 2024-03-01 DIAGNOSIS — Z961 Presence of intraocular lens: Secondary | ICD-10-CM | POA: Diagnosis not present

## 2024-03-01 DIAGNOSIS — H43813 Vitreous degeneration, bilateral: Secondary | ICD-10-CM | POA: Diagnosis not present

## 2024-03-01 DIAGNOSIS — D3131 Benign neoplasm of right choroid: Secondary | ICD-10-CM | POA: Diagnosis not present

## 2024-03-01 DIAGNOSIS — H35371 Puckering of macula, right eye: Secondary | ICD-10-CM | POA: Diagnosis not present

## 2024-03-04 ENCOUNTER — Ambulatory Visit

## 2024-03-04 DIAGNOSIS — R2681 Unsteadiness on feet: Secondary | ICD-10-CM

## 2024-03-04 DIAGNOSIS — R42 Dizziness and giddiness: Secondary | ICD-10-CM | POA: Diagnosis not present

## 2024-03-04 NOTE — Therapy (Signed)
 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    Lattice degeneration of peripheral retina    Leg varices 03/10/2015   Osteoarthrosis, unspecified whether generalized or localized, hand    Polyp of transverse colon    Posttraumatic stress disorder    Sinus bradycardia 04/17/2014   Varicose veins of bilateral lower extremities with other complications    Wears dentures    partial upper and lower   Past Surgical History:  Procedure Laterality Date   CATARACT EXTRACTION Right 2015   CATARACT EXTRACTION W/PHACO Left 10/21/2015   Procedure: CATARACT EXTRACTION PHACO AND INTRAOCULAR LENS PLACEMENT (IOC);  Surgeon: Dene Etienne, MD;  Location: [facility];  Service: Ophthalmology;  Laterality: Left;   CATARACT EXTRACTION, BILATERAL  2016   COLONOSCOPY  2014   benign polyps   COLONOSCOPY WITH PROPOFOL  N/A 03/10/2020   Procedure: COLONOSCOPY WITH PROPOFOL ;  Surgeon: Jinny Carmine, MD;  Location: [facility];  Service: Endoscopy;  Laterality: N/A;  PRIORITY 4   COLONOSCOPY WITH PROPOFOL  N/A 04/26/2022   Procedure: COLONOSCOPY WITH PROPOFOL ;  Surgeon: Jinny Carmine, MD;  Location: [facility];  Service: Endoscopy;  Laterality: N/A;   CYSTOSCOPY     ESOPHAGEAL MANOMETRY N/A 08/03/2016   Procedure: ESOPHAGEAL MANOMETRY (EM);  Surgeon: Gustav LULLA Mcgee, MD;  Location: [facility];  Service: Endoscopy;  Laterality: N/A;   ESOPHAGOGASTRODUODENOSCOPY     ESOPHAGOGASTRODUODENOSCOPY N/A 04/26/2022   Procedure: ESOPHAGOGASTRODUODENOSCOPY (EGD);   Surgeon: Jinny Carmine, MD;  Location: [facility];  Service: Endoscopy;  Laterality: N/A;   ESOPHAGOGASTRODUODENOSCOPY (EGD) WITH PROPOFOL  N/A 02/17/2016   with esoph dilatation   ESOPHAGOGASTRODUODENOSCOPY (EGD) WITH PROPOFOL  N/A 03/10/2020   Procedure: ESOPHAGOGASTRODUODENOSCOPY (EGD) WITH PROPOFOL ;  Surgeon: Jinny Carmine, MD;  Location: [facility];  Service: Endoscopy;  Laterality: N/A;   TONSILLECTOMY     Patient Active Problem List   Diagnosis Date Noted   Globus sensation 12/11/2023   Gastric intestinal metaplasia    Leg pain, bilateral 07/22/2021   Sensory polyneuropathy 07/22/2021   Intestinal metaplasia of gastric mucosa 05/20/2021   Senile nuclear sclerosis 12/02/2020   Restless legs syndrome 12/02/2020   Osteoarthritis of hip 12/02/2020   Myopia 12/02/2020   Esophageal reflux 12/02/2020   Hearing loss 12/02/2020   Actinic keratosis 12/02/2020   Chronic obstructive lung disease (HCC) 12/02/2020   Chronic neck pain 12/02/2020   Other ill-defined and unknown causes of morbidity and mortality 12/02/2020   Neurocardiogenic pre-syncope 10/13/2020   Dietary B12 deficiency 05/14/2020   Headache disorder 04/13/2020   Other diseases of stomach and duodenum    Hx of transient ischemic attack (TIA) 01/28/2020   Elevated left ventricular end-diastolic pressure (LVEDP) 06/01/2019   Moderate aortic regurgitation 06/01/2019   Carotid artery plaque, left 07/09/2018   Sick sinus syndrome (HCC) 03/09/2017   Anosmia 05/05/2016   Dysphagia 02/03/2016   Tobacco use disorder, moderate, in sustained remission 03/11/2015   Lumbar disc herniation with radiculopathy 03/11/2015   Degeneration of intervertebral disc 03/10/2015   BPH with obstruction/lower urinary tract symptoms 03/10/2015   Degenerative  arthritis of finger 03/10/2015   Post-traumatic stress disorder 03/10/2015   Lattice degeneration 03/10/2015   Leg varices 03/10/2015    PCP: Justus Leita DEL, MD REFERRING PROVIDER:  Lane Arthea BRAVO, MD  REFERRING DIAG:  Diagnosis  R42 (ICD-10-CM) - Dizziness and giddiness    THERAPY DIAG:  No diagnosis found.  ONSET DATE: 3 months ago (around March 2025)  Rationale for Evaluation and Treatment: Rehabilitation  SUBJECTIVE:   SUBJECTIVE STATEMENT: Pt feeling a bit dizzy today, said felt this when he was walking. Felt his balance was off. His eyes feel swollen (no swelling upon observation currently, skin appears WNL). Pt reports nothing found on eye exam at ophthalmologist appt. Pt reports energy level has been kind of OK. Reports some stress outside of PT.  Pt accompanied by: self  PERTINENT HISTORY:  The pt is a pleasant 78 y/o male presenting to PT for dizziness and imbalance He reports onset of symptoms approx 3 months ago: dizziness, lightheadedness, zero energy, and general fatigue. At times he feels he cannot focus his eyes. He reports decreased balance. He reports no falls but some close-calls when walking or when on stairs/steps.He at times feels his head is spinning around. The world can look like it's spinning quite often. He does not think rolling causes spinning. Bending does not cause spinning, but causes lightheadedness he reports lightheadedness and spinning are sort of the same thing for him). Tilting his head back can cause symptoms. Turning his head quickly side to side can cause symptoms. Symptoms can happen even at rest when pt is sitting and perfectly still. He reports rare ear fullness, rarely hsa headaches, occ ear ringing (bilaterally) .Pt reports halter monitor did not show anything abnormal. PMH includes chronic airway obstruction, DDD, degeneration of cervical intervertebral disc, dysphagia, HLD, intervertebral disc disorder of lumbar region with myelopathy, lattice degeneration of peripheral retina, OA, PTSD, sinus bradycardia, carotid artery plaque, left, hx TIA, B12 deficiency, tobacco use disorder, anosmia, sick sinus syndrome, hearing  loss, chronic neck pain, sensory polyneuropathy, please refer to chart for full details  PAIN:  Are you having pain? Chornic Rt hip pain DJD (5/10)   PRECAUTIONS: Fall  RED FLAGS: None   WEIGHT BEARING RESTRICTIONS: No  FALLS: Has patient fallen in last 6 months? No  LIVING ENVIRONMENT: Lives with: lives alone Lives in: House/apartment Stairs: 2 steps (no handrails), one level home  Has following equipment at home: Crutches and SPC but not currently using   PLOF: Independent  PATIENT GOALS: wants to improve lightheadedness, weakness, visual focus, balance and dizziness   OBJECTIVE:  Note: Objective measures were completed at Evaluation unless otherwise noted.  DIAGNOSTIC FINDINGS:  via chart  MR BRAIN 05/05/23: FINDINGS: Brain: No acute infarction, hemorrhage, hydrocephalus, extra-axial collection or mass lesion. Mild to moderate for age scattered T2/FLAIR hyperintensities in the white matter, nonspecific but compatible with chronic microvascular ischemic change.   Vascular: Major arterial flow voids are maintained at the skull base.   Skull and upper cervical spine: Normal marrow signal.   Sinuses/Orbits: Negative.   Other: No mastoid effusions.   IMPRESSION: No evidence of acute intracranial abnormality.    Electronically Signed   By: Gilmore GORMAN Molt M.D.   On: 05/05/2023 14:31  US  carotid 03/14/23: IMPRESSION: 1. Minimal to moderate amount of left-sided atherosclerotic plaque, morphologically similar to the 09/2018 examination and again not resulting in a hemodynamically significant stenosis. 2. Very minimal amount of right-sided intimal thickening/atherosclerotic plaque, morphologically similar to the 09/2018 examination and  again not resulting in a hemodynamically significant stenosis.     Electronically Signed   By: Norleen Roulette M.D.   On: 03/14/2023 16:35  COGNITION: Overall cognitive status: Within functional limits for tasks  assessed   SENSATION: Pt reports no numbness, but some tingling in his legs that he has off and on for several months. Formal assessment not completed at this time  EDEMA:  Reports rarely gets some swelling in his legs (has worked with vascular, reports he was told he has bad veins, and was instructed to wear compression socks)  POSTURE:  rounded shoulders and forward head  Cervical ROM:   Quick cervical screen completed, some minor pain with end range motion with cervical flexion but all other movements pt demo's sufficient AROM for testing that is pain-free   BED MOBILITY:  Impaired per DHI. In session did demo lightheadedness with supine>sit and sit>stand   TRANSFERS: Assistive device utilized: None  Sit to stand: Complete Independence Stand to sit: Complete Independence Chair to chair: Complete Independence *does at times require extra time standing near chair due to lightheaded feeling/imbalance prior to ambulating   GAIT: Gait pattern: per observation of pt ambulating in and out of clinic pt with variability in BOS, mostly utilizes wider BOS, must briefly stop upon first standing due to symptoms. Distance walked: clinic distances Comments: I suspect scanning is likely impaired with gait as pt does report quick head turns can cause symptoms  FUNCTIONAL TESTS:  Dynamic Gait Index: deferred  PATIENT SURVEYS:  DHI 30  VESTIBULAR ASSESSMENT:   OCULOMOTOR EXAM:  Ocular Alignment: normal  Ocular ROM: some limitations with L eye with end-range gaze to L   Spontaneous Nystagmus: absent  Gaze-Induced Nystagmus: absent  Smooth Pursuits: saccadic throughout, most notable with vertical gaze   Saccades: corrective saccade consistent with L gaze - pt notes this makes him slightly dizzy  Convergence/Divergence: sees double about 4 away; L eye has difficulty converging/does not converge, R eye slightly converges   02/19/24: DVA: POSITIVE Change from 7 to line 4     VESTIBULAR - OCULAR REFLEX: 02/19/24:  Slow VOR: WNL  VOR Cancellation: movement WNL but reports issues with moving background  Head-Impulse Test: positive bilaterally   Dynamic Visual Acuity:    POSITIONAL TESTING:  Room light: Dix-Hallpike R and L no nystagmus present, but pt does feel consistently lightheaded/imbalance in each testing position, no reported vertigo in testing positions. Pt lightheaded with feeling of self-spinning upon sitting up but still no nystagmus present. Roll Test: negative bilaterally - lightheaded with sitting back up  OTHOSTATICS: Orthostatics- 02/19/24 Supine: 114/66 mmHg HR 60  bpm Seated: 118/65 mmHg HR 63 bpm  Standing 132/67 mmHg HR 60 bpm   FUNCTIONAL GAIT: No formal assessment completed (DGI deferred), but per observation of pt ambulating in and out of clinic pt with variability in BOS, mostly utilizes wider BOS, must briefly stop upon first standing due to symptoms.  TREATMENT DATE 03/04/24 :   TA: Nustep LE mm endurance/cardio conditioning (seat 9, UE 8.5) and to promote symptom modulation Lvl 1 min 30 sec  Lvl 2 x 1 min  Lvl 3 x 1 min Lvl 4 x 2 min 30 sec  SPM 60s-70s Comments: pt report hip feels OK with intervention   NMR: Habituation- Seated bending 10x cuing for slow speed Standing bending 2x10 - slightly symptomatic   Seated VOR cancellation habituation 2x10  Standing VOR cancellations habituation 2x10  <1 pt increase in symptoms 1/4 turns first to R only then to L only 2x10 for each, feeling slightly off balance, half point increase in symptoms  Vertical head turns WBOS 2x10 - makes pt feel a little lightheaded, some sway noted   Seated gaze stabilization VORx1 horizontal head turns 4x30 sec - makes pt slightly symptomatic    PATIENT EDUCATION: Education details: exercise technique, Person educated:  Patient Education method: Explanation Education comprehension: verbalized understanding  HOME EXERCISE PROGRAM:  GOALS: Goals reviewed with patient? Yes initiated  SHORT TERM GOALS: Target date: 04/15/2024   Patient will be independent in home exercise program to improve balance, strength and mobility for better functional independence with ADLs. Baseline: to be initiated next 1-2 visits Goal status: INITIAL  LONG TERM GOALS: Target date: 05/27/2024    Patient will reduce dizziness handicap inventory score to <20, for less dizziness with ADLs and increased safety with home and work tasks.  Baseline: 30 Goal status: INITIAL  2.  Patient will increase ABC scale score >80% to demonstrate better functional mobility and better confidence with ADLs.  Baseline: to be assessed Goal status: INITIAL  3.  Patient will increase dynamic gait index score to >19/24 as to demonstrate reduced fall risk and improved dynamic gait balance for better safety with community/home ambulation.   Baseline: 20/24 Goal status: INITIAL  4. Pt will demonstrate ability to go from supine>sit without spinning symptoms to indicate improved ease with bed mobility and safety.   Baseline: consistently makes pt lightheaded/feel self-spinning  Goal status: INITIAL   ASSESSMENT:  CLINICAL IMPRESSION: Pt initiated LE mm endurance/cardio conditioning program today to target fatigue and to promote symptom modulation.  He tolerates this well, but was instructed to monitor for any increased hip pain later. Pt still mildly symptomatic with majority of habituation and gaze stabilization interventions, will progress as appropriate. The pt will benefit from further skilled PT to improve imbalance and dizziness to increase ease and safety with ADLs and decrease fall risk.  OBJECTIVE IMPAIRMENTS: Abnormal gait, decreased balance, difficulty walking, decreased strength, dizziness, impaired sensation, improper body mechanics,  postural dysfunction, and pain.   ACTIVITY LIMITATIONS: bending, sitting, standing, stairs, transfers, bed mobility, and locomotion level  PARTICIPATION LIMITATIONS: meal prep, cleaning, shopping, community activity, and yard work  PERSONAL FACTORS: Age and 3+ comorbidities: PMH includes chronic airway obstruction, DDD, degeneration of cervical intervertebral disc, dysphagia, HLD, intervertebral disc disorder of lumbar region with myelopathy, lattice degeneration of peripheral retina, OA, PTSD, sinus bradycardia, carotid artery plaque, left, hx TIA, B12 deficiency, tobacco use disorder, anosmia, sick sinus syndrome, hearing loss, chronic neck pain, sensory polyneuropathy, please refer to chart for full details are also affecting patient's functional outcome.   REHAB POTENTIAL: Good  CLINICAL DECISION MAKING: Evolving/moderate complexity  EVALUATION COMPLEXITY: Moderate   PLAN:  PT FREQUENCY: 1-2x/week  PT DURATION: 12 weeks  PLANNED INTERVENTIONS: [id number]- PT Re-evaluation, 97750- Physical Performance Testing, 97110-Therapeutic exercises, 97530- Therapeutic activity, [id number]- Neuromuscular re-education, 97535- Self Care, [id number]-  Manual therapy, [id number]- Gait training, 02239- Orthotic Initial, [phone]- Orthotic/Prosthetic subsequent, [phone]- Canalith repositioning, 02239- Splinting, 02967- Electrical stimulation (manual), Patient/Family education, Balance training, Stair training, Taping, Joint mobilization, Spinal mobilization, Vestibular training, Visual/preceptual remediation/compensation, DME instructions, Wheelchair mobility training, Cryotherapy, and Moist heat  PLAN FOR NEXT SESSION: VOR training, gaze stabilization,cardioresp conditioning   8:49 AM, 03/04/24 Darryle Patten PT, DPT   Physical Therapist - [facility]  [facility] [phone]     Darryle JONELLE Patten, PT 03/04/2024, 8:49 AM

## 2024-03-07 ENCOUNTER — Ambulatory Visit

## 2024-03-11 ENCOUNTER — Ambulatory Visit: Attending: Neurology

## 2024-03-11 DIAGNOSIS — R2681 Unsteadiness on feet: Secondary | ICD-10-CM | POA: Insufficient documentation

## 2024-03-11 NOTE — Therapy (Signed)
 OUTPATIENT PHYSICAL THERAPY TREATMENT/DISCHARGE NOTE  Patient Name: Timothy Scott MRN: [id number] DOB:1946/01/26, 78 y.o., male Today's Date: 03/11/2024  END OF SESSION:  PT End of Session - 03/11/24 0845     Visit Number 6    Number of Visits 25    Date for PT Re-Evaluation 05/06/24    PT Start Time 0846    PT Stop Time 0914    PT Time Calculation (min) 28 min    Equipment Utilized During Treatment Gait belt    Activity Tolerance Patient tolerated treatment well    Behavior During Therapy WFL for tasks assessed/performed            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    Lattice degeneration of peripheral retina    Leg varices 03/10/2015   Osteoarthrosis, unspecified whether generalized or localized, hand    Polyp of transverse colon    Posttraumatic stress disorder    Sinus bradycardia 04/17/2014   Varicose veins of bilateral lower extremities with other complications    Wears dentures    partial upper and lower   Past Surgical History:  Procedure Laterality Date   CATARACT EXTRACTION Right 2015   CATARACT EXTRACTION W/PHACO Left 10/21/2015   Procedure: CATARACT EXTRACTION PHACO AND INTRAOCULAR LENS PLACEMENT (IOC);  Surgeon: Dene Etienne, MD;  Location: [facility];  Service: Ophthalmology;  Laterality: Left;   CATARACT EXTRACTION, BILATERAL  2016   COLONOSCOPY  2014   benign polyps   COLONOSCOPY WITH PROPOFOL  N/A 03/10/2020   Procedure: COLONOSCOPY WITH PROPOFOL ;  Surgeon: Jinny Carmine, MD;  Location: [facility];  Service: Endoscopy;  Laterality: N/A;  PRIORITY 4   COLONOSCOPY WITH PROPOFOL  N/A 04/26/2022   Procedure: COLONOSCOPY WITH PROPOFOL ;  Surgeon: Jinny Carmine, MD;  Location: [facility];  Service: Endoscopy;  Laterality: N/A;   CYSTOSCOPY     ESOPHAGEAL MANOMETRY N/A 08/03/2016   Procedure: ESOPHAGEAL MANOMETRY (EM);  Surgeon: Gustav LULLA Mcgee, MD;  Location: [facility];  Service: Endoscopy;  Laterality: N/A;   ESOPHAGOGASTRODUODENOSCOPY     ESOPHAGOGASTRODUODENOSCOPY N/A 04/26/2022   Procedure: ESOPHAGOGASTRODUODENOSCOPY (EGD);  Surgeon: Jinny Carmine, MD;  Location: [facility];  Service: Endoscopy;  Laterality: N/A;   ESOPHAGOGASTRODUODENOSCOPY (EGD) WITH PROPOFOL  N/A 02/17/2016   with esoph dilatation   ESOPHAGOGASTRODUODENOSCOPY (EGD) WITH PROPOFOL  N/A 03/10/2020   Procedure: ESOPHAGOGASTRODUODENOSCOPY (EGD) WITH PROPOFOL ;  Surgeon: Jinny Carmine, MD;  Location: [facility] ENDOSCOPY;  Service: Endoscopy;  Laterality: N/A;   TONSILLECTOMY     Patient Active Problem List   Diagnosis Date Noted   Globus sensation 12/11/2023   Gastric intestinal metaplasia    Leg pain, bilateral 07/22/2021   Sensory polyneuropathy 07/22/2021   Intestinal metaplasia of gastric mucosa 05/20/2021   Senile nuclear sclerosis 12/02/2020   Restless legs syndrome 12/02/2020   Osteoarthritis of hip 12/02/2020   Myopia 12/02/2020   Esophageal reflux 12/02/2020   Hearing loss 12/02/2020   Actinic keratosis 12/02/2020   Chronic obstructive lung disease (HCC) 12/02/2020   Chronic neck pain 12/02/2020   Other ill-defined and unknown causes of morbidity and mortality 12/02/2020   Neurocardiogenic pre-syncope 10/13/2020   Dietary B12 deficiency 05/14/2020   Headache disorder 04/13/2020   Other  diseases of stomach and duodenum    Hx of transient ischemic attack (TIA) 01/28/2020   Elevated left ventricular end-diastolic pressure (LVEDP) 06/01/2019   Moderate aortic regurgitation 06/01/2019   Carotid artery plaque, left 07/09/2018   Sick sinus syndrome (HCC) 03/09/2017   Anosmia 05/05/2016   Dysphagia 02/03/2016   Tobacco use disorder, moderate, in sustained  remission 03/11/2015   Lumbar disc herniation with radiculopathy 03/11/2015   Degeneration of intervertebral disc 03/10/2015   BPH with obstruction/lower urinary tract symptoms 03/10/2015   Degenerative arthritis of finger 03/10/2015   Post-traumatic stress disorder 03/10/2015   Lattice degeneration 03/10/2015   Leg varices 03/10/2015    PCP: Justus Leita DEL, MD REFERRING PROVIDER: Lane Arthea BRAVO, MD  REFERRING DIAG:  Diagnosis  R42 (ICD-10-CM) - Dizziness and giddiness    THERAPY DIAG:  Unsteadiness on feet  ONSET DATE: 3 months ago (around March 2025)  Rationale for Evaluation and Treatment: Rehabilitation  SUBJECTIVE:   SUBJECTIVE STATEMENT: Pt feels dizziness and balance has improved and that he is ready to discharge from PT. Thinks residual unsteadiness has more to do with chronic orthopedic/hip pain (is following up with his MD for this).  Pt accompanied by: self  PERTINENT HISTORY:  The pt is a pleasant 78 y/o male presenting to PT for dizziness and imbalance He reports onset of symptoms approx 3 months ago: dizziness, lightheadedness, zero energy, and general fatigue. At times he feels he cannot focus his eyes. He reports decreased balance. He reports no falls but some close-calls when walking or when on stairs/steps.He at times feels his head is spinning around. The world can look like it's spinning quite often. He does not think rolling causes spinning. Bending does not cause spinning, but causes lightheadedness he reports lightheadedness and spinning are sort of the same thing for him). Tilting his head back can cause symptoms. Turning his head quickly side to side can cause symptoms. Symptoms can happen even at rest when pt is sitting and perfectly still. He reports rare ear fullness, rarely hsa headaches, occ ear ringing (bilaterally) .Pt reports halter monitor did not show anything abnormal. PMH includes chronic airway obstruction, DDD, degeneration of cervical  intervertebral disc, dysphagia, HLD, intervertebral disc disorder of lumbar region with myelopathy, lattice degeneration of peripheral retina, OA, PTSD, sinus bradycardia, carotid artery plaque, left, hx TIA, B12 deficiency, tobacco use disorder, anosmia, sick sinus syndrome, hearing loss, chronic neck pain, sensory polyneuropathy, please refer to chart for full details  PAIN:  Are you having pain? Chornic Rt hip pain DJD (5/10)   PRECAUTIONS: Fall  RED FLAGS: None   WEIGHT BEARING RESTRICTIONS: No  FALLS: Has patient fallen in last 6 months? No  LIVING ENVIRONMENT: Lives with: lives alone Lives in: House/apartment Stairs: 2 steps (no handrails), one level home  Has following equipment at home: Crutches and SPC but not currently using   PLOF: Independent  PATIENT GOALS: wants to improve lightheadedness, weakness, visual focus, balance and dizziness   OBJECTIVE:  Note: Objective measures were completed at Evaluation unless otherwise noted.  DIAGNOSTIC FINDINGS:  via chart  MR BRAIN 05/05/23: FINDINGS: Brain: No acute infarction, hemorrhage, hydrocephalus, extra-axial collection or mass lesion. Mild to moderate for age scattered T2/FLAIR hyperintensities in the white matter, nonspecific but compatible with chronic microvascular ischemic change.   Vascular: Major arterial flow voids are maintained at the skull base.   Skull and upper cervical spine: Normal marrow signal.   Sinuses/Orbits: Negative.   Other: No mastoid effusions.  IMPRESSION: No evidence of acute intracranial abnormality.    Electronically Signed   By: Gilmore GORMAN Molt M.D.   On: 05/05/2023 14:31  US  carotid 03/14/23: IMPRESSION: 1. Minimal to moderate amount of left-sided atherosclerotic plaque, morphologically similar to the 09/2018 examination and again not resulting in a hemodynamically significant stenosis. 2. Very minimal amount of right-sided intimal thickening/atherosclerotic plaque,  morphologically similar to the 09/2018 examination and again not resulting in a hemodynamically significant stenosis.     Electronically Signed   By: Norleen Roulette M.D.   On: 03/14/2023 16:35  COGNITION: Overall cognitive status: Within functional limits for tasks assessed   SENSATION: Pt reports no numbness, but some tingling in his legs that he has off and on for several months. Formal assessment not completed at this time  EDEMA:  Reports rarely gets some swelling in his legs (has worked with vascular, reports he was told he has bad veins, and was instructed to wear compression socks)  POSTURE:  rounded shoulders and forward head  Cervical ROM:   Quick cervical screen completed, some minor pain with end range motion with cervical flexion but all other movements pt demo's sufficient AROM for testing that is pain-free   BED MOBILITY:  Impaired per DHI. In session did demo lightheadedness with supine>sit and sit>stand   TRANSFERS: Assistive device utilized: None  Sit to stand: Complete Independence Stand to sit: Complete Independence Chair to chair: Complete Independence *does at times require extra time standing near chair due to lightheaded feeling/imbalance prior to ambulating   GAIT: Gait pattern: per observation of pt ambulating in and out of clinic pt with variability in BOS, mostly utilizes wider BOS, must briefly stop upon first standing due to symptoms. Distance walked: clinic distances Comments: I suspect scanning is likely impaired with gait as pt does report quick head turns can cause symptoms  FUNCTIONAL TESTS:  Dynamic Gait Index: deferred  PATIENT SURVEYS:  DHI 30  VESTIBULAR ASSESSMENT:   OCULOMOTOR EXAM:  Ocular Alignment: normal  Ocular ROM: some limitations with L eye with end-range gaze to L   Spontaneous Nystagmus: absent  Gaze-Induced Nystagmus: absent  Smooth Pursuits: saccadic throughout, most notable with vertical gaze   Saccades:  corrective saccade consistent with L gaze - pt notes this makes him slightly dizzy  Convergence/Divergence: sees double about 4 away; L eye has difficulty converging/does not converge, R eye slightly converges   02/19/24: DVA: POSITIVE Change from 7 to line 4    VESTIBULAR - OCULAR REFLEX: 02/19/24:  Slow VOR: WNL  VOR Cancellation: movement WNL but reports issues with moving background  Head-Impulse Test: positive bilaterally   Dynamic Visual Acuity:    POSITIONAL TESTING:  Room light: Dix-Hallpike R and L no nystagmus present, but pt does feel consistently lightheaded/imbalance in each testing position, no reported vertigo in testing positions. Pt lightheaded with feeling of self-spinning upon sitting up but still no nystagmus present. Roll Test: negative bilaterally - lightheaded with sitting back up  OTHOSTATICS: Orthostatics- 02/19/24 Supine: 114/66 mmHg HR 60  bpm Seated: 118/65 mmHg HR 63 bpm  Standing 132/67 mmHg HR 60 bpm   FUNCTIONAL GAIT: No formal assessment completed (DGI deferred), but per observation of pt ambulating in and out of clinic pt with variability in BOS, mostly utilizes wider BOS, must briefly stop upon first standing due to symptoms.  TREATMENT DATE 03/11/24 :   Physical Performance. PT instructed pt in DGI. See below for results. Demonstrates increased fall risk with score of 23/24. (<19 indicates increased fall risk)   [facility] PT Assessment - 03/11/24 0001       Dynamic Gait Index   Level Surface Normal    Change in Gait Speed Normal    Gait with Horizontal Head Turns Normal    Gait with Vertical Head Turns Normal    Gait and Pivot Turn Mild Impairment    Step Over Obstacle Normal    Step Around Obstacles Normal    Steps Normal    Total Score 23          NMR: DHI: 14 ABC: 70% Please refer to goal section below for  other tests/full results & details of goals    PATIENT EDUCATION: Education details: exercise technique, HEP update, d/c recommendations  Person educated: Patient Education method: Explanation, VC, demo, printout Education comprehension: verbalized understanding, returned demo  HOME EXERCISE PROGRAM: Updated for d/c Access Code: [email or web address] URL: [email or web address] Date: 03/11/2024 Prepared by: Darryle Patten  Exercises - Seated Gaze Stabilization with Head Rotation  - 1 x daily - 7 x weekly - 4-6 sets - 1 reps - 30-60 seconds hold  GOALS: Goals reviewed with patient? Yes initiated  SHORT TERM GOALS: Target date: 04/22/2024   Patient will be independent in home exercise program to improve balance, strength and mobility for better functional independence with ADLs. Baseline: to be initiated next 1-2 visits; 7/7: pt indep, confirmed correct technique and understanding during visit with final HEP update Goal status: MET  LONG TERM GOALS: Target date: 06/03/2024    Patient will reduce dizziness handicap inventory score to <20, for less dizziness with ADLs and increased safety with home and work tasks.  Baseline: 30; 7/7: 14  Goal status: MET  2.  Patient will increase ABC scale score >80% to demonstrate better functional mobility and better confidence with ADLs.  Baseline: 7/7: 70% Goal status: ONGOING  3.  Patient will increase dynamic gait index score to >19/24 as to demonstrate reduced fall risk and improved dynamic gait balance for better safety with community/home ambulation.   Baseline: 20/24; 7/7: 23/24 Goal status: MET  4. Pt will demonstrate ability to go from supine>sit without spinning symptoms to indicate improved ease with bed mobility and safety.   Baseline: consistently makes pt lightheaded/feel self-spinning; 7/7: reports no spinning symptoms, very slight unsteadiness felt  Goal status: MET   ASSESSMENT:  CLINICAL IMPRESSION: Pt returns to PT  reporting feeling ready for discharge due to overall improvement in balance and dizziness. Goals reassessed and he has now met all but one therapy goal. This indicates improvements in balance, positionally-triggered dizziness, and decreased perception of handicap due to dizziness. Pt now reports he feels remaining issues with balance are orthopedic in nature and plans to continue following up with his MD for this. D/c rec and HEP updated provided. The pt does not require further skilled vestibular PT and is to be discharged on this date. Pt agreeable to this plan.    OBJECTIVE IMPAIRMENTS: Abnormal gait, decreased balance, difficulty walking, decreased strength, dizziness, impaired sensation, improper body mechanics, postural dysfunction, and pain.   ACTIVITY LIMITATIONS: bending, sitting, standing, stairs, transfers, bed mobility, and locomotion level  PARTICIPATION LIMITATIONS: meal prep, cleaning, shopping, community activity, and yard work  PERSONAL FACTORS: Age and 3+ comorbidities: PMH includes chronic airway obstruction, DDD, degeneration of cervical intervertebral disc, dysphagia, HLD, intervertebral disc  disorder of lumbar region with myelopathy, lattice degeneration of peripheral retina, OA, PTSD, sinus bradycardia, carotid artery plaque, left, hx TIA, B12 deficiency, tobacco use disorder, anosmia, sick sinus syndrome, hearing loss, chronic neck pain, sensory polyneuropathy, please refer to chart for full details are also affecting patient's functional outcome.   REHAB POTENTIAL: Good  CLINICAL DECISION MAKING: Evolving/moderate complexity  EVALUATION COMPLEXITY: Moderate   PLAN:  PT FREQUENCY: 1-2x/week  PT DURATION: 12 weeks  PLANNED INTERVENTIONS: 97164- PT Re-evaluation, 97750- Physical Performance Testing, 97110-Therapeutic exercises, 97530- Therapeutic activity, 97112- Neuromuscular re-education, 97535- Self Care, [id number]- Manual therapy, [phone]- Gait training, [phone]- Orthotic  Initial, [phone]- Orthotic/Prosthetic subsequent, [phone]- Canalith repositioning, [phone]- Splinting, [phone]- Electrical stimulation (manual), Patient/Family education, Balance training, Stair training, Taping, Joint mobilization, Spinal mobilization, Vestibular training, Visual/preceptual remediation/compensation, DME instructions, Wheelchair mobility training, Cryotherapy, and Moist heat  PLAN FOR NEXT SESSION: d/c  9:23 AM, 03/11/24 Darryle Patten PT, DPT   Physical Therapist - [facility]  [facility] [phone]     Darryle JONELLE Patten, PT 03/11/2024, 9:23 AM

## 2024-03-14 ENCOUNTER — Other Ambulatory Visit: Payer: Self-pay

## 2024-03-14 DIAGNOSIS — K31A0 Gastric intestinal metaplasia, unspecified: Secondary | ICD-10-CM

## 2024-03-18 ENCOUNTER — Ambulatory Visit

## 2024-03-19 ENCOUNTER — Ambulatory Visit

## 2024-03-20 ENCOUNTER — Ambulatory Visit

## 2024-03-25 ENCOUNTER — Ambulatory Visit

## 2024-03-27 ENCOUNTER — Ambulatory Visit

## 2024-04-01 ENCOUNTER — Ambulatory Visit

## 2024-04-03 ENCOUNTER — Ambulatory Visit

## 2024-04-08 ENCOUNTER — Ambulatory Visit

## 2024-04-10 ENCOUNTER — Ambulatory Visit

## 2024-04-15 ENCOUNTER — Ambulatory Visit

## 2024-04-15 DIAGNOSIS — G2581 Restless legs syndrome: Secondary | ICD-10-CM | POA: Diagnosis not present

## 2024-04-15 DIAGNOSIS — G608 Other hereditary and idiopathic neuropathies: Secondary | ICD-10-CM | POA: Diagnosis not present

## 2024-04-15 DIAGNOSIS — I2089 Other forms of angina pectoris: Secondary | ICD-10-CM | POA: Diagnosis not present

## 2024-04-15 DIAGNOSIS — R42 Dizziness and giddiness: Secondary | ICD-10-CM | POA: Diagnosis not present

## 2024-04-15 DIAGNOSIS — E538 Deficiency of other specified B group vitamins: Secondary | ICD-10-CM | POA: Diagnosis not present

## 2024-04-15 DIAGNOSIS — E785 Hyperlipidemia, unspecified: Secondary | ICD-10-CM | POA: Diagnosis not present

## 2024-04-15 DIAGNOSIS — Z8673 Personal history of transient ischemic attack (TIA), and cerebral infarction without residual deficits: Secondary | ICD-10-CM | POA: Diagnosis not present

## 2024-04-15 DIAGNOSIS — Z0000 Encounter for general adult medical examination without abnormal findings: Secondary | ICD-10-CM | POA: Diagnosis not present

## 2024-04-15 DIAGNOSIS — F4310 Post-traumatic stress disorder, unspecified: Secondary | ICD-10-CM | POA: Diagnosis not present

## 2024-04-15 DIAGNOSIS — Z131 Encounter for screening for diabetes mellitus: Secondary | ICD-10-CM | POA: Diagnosis not present

## 2024-04-15 DIAGNOSIS — J449 Chronic obstructive pulmonary disease, unspecified: Secondary | ICD-10-CM | POA: Diagnosis not present

## 2024-04-15 DIAGNOSIS — I495 Sick sinus syndrome: Secondary | ICD-10-CM | POA: Diagnosis not present

## 2024-04-15 DIAGNOSIS — Z1331 Encounter for screening for depression: Secondary | ICD-10-CM | POA: Diagnosis not present

## 2024-04-15 DIAGNOSIS — I6522 Occlusion and stenosis of left carotid artery: Secondary | ICD-10-CM | POA: Diagnosis not present

## 2024-04-15 DIAGNOSIS — M5116 Intervertebral disc disorders with radiculopathy, lumbar region: Secondary | ICD-10-CM | POA: Diagnosis not present

## 2024-04-17 ENCOUNTER — Ambulatory Visit

## 2024-04-17 NOTE — Progress Notes (Signed)
 04/18/2024 10:27 AM   Timothy Scott Knee 1946-03-24 [id number]  Referring provider: Justus Leita DEL, MD [address] [place],  [place] [address]  Urological history: 1. BPH w/ LU TS -PSA (05/2023) 0.19 -cysto (2019) -  Enlarged prostate visually obstructive ~5 cm in length - Mild trabeculation with small saccules in the posterior well - Retroflexion shows intravesical lobe noted -cysto (2021) - Marked lateral lobe enlargement of prostate gland, hypervascular - Moderate bladder neck elevation - moderate trabeculation with cellules - Retroflexion shows intravesical median lobe.  -prostate volume ~45 cc  2. High risk hematuria -former smoker -CTU (2019) - Mild prostate gland enlargement with mild circumferential wall thickening of the urinary bladder. -RUS (2021) - No acute or focal renal abnormality identified. Right extrarenal pelvis again noted.  No bladder distention. Bladder wall is again noted to be diffusely thickened at 0.7 cm. Prostate is prominent and irregular  -cysto (2019) -  Enlarged prostate Visually obstructive ~5 cm in length - Mild trabeculation with small saccules in the posterior well - Retroflexion shows intravesical lobe noted -cysto (2021) - Marked lateral lobe enlargement of prostate gland, hypervascular - Moderate bladder neck elevation - moderate trabeculation with cellules - Retroflexion shows intravesical median lobe.  -CTU (06/2022) - prostatomegaly and bladder wall thickening w/ numerous diverticula   3.  Incomplete bladder emptying -previous PVR's ~ 200 cc   Chief Complaint  Patient presents with   BPH with obstruction/lower urinary tract symptoms    HPI: Timothy Scott is a 78 y.o. man who presents today for pain, stinging and burn when urinating.    Previous records reviewed.     He has been having some intermittent pain in the tip of his penis.  It does not happen very often and it is not bothersome.  He has to get up several times at  night, and he is wondering if he should get back on the tamsulosin .  He also states he drinks a lot of coffee and knows he needs to decrease caffeine intake.  Patient denies any modifying or aggravating factors.  Patient denies any recent UTI's, gross hematuria, dysuria or suprapubic/flank pain.  Patient denies any fevers, chills, nausea or vomiting.    UA positive for leukocytes and hematuria  PVR 143 mL    PMH: 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    Lattice degeneration of peripheral retina    Leg varices 03/10/2015   Osteoarthrosis, unspecified whether generalized or localized, hand    Polyp of transverse colon    Posttraumatic stress disorder    Sinus bradycardia 04/17/2014   Varicose veins of bilateral lower extremities with other complications    Wears dentures    partial upper and lower    Surgical History: Past Surgical History:  Procedure Laterality Date   CATARACT EXTRACTION Right 2015   CATARACT EXTRACTION W/PHACO Left 10/21/2015   Procedure: CATARACT EXTRACTION PHACO AND INTRAOCULAR LENS PLACEMENT (IOC);  Surgeon: Dene Etienne, MD;  Location: [facility];  Service: Ophthalmology;  Laterality: Left;   CATARACT EXTRACTION, BILATERAL  2016   COLONOSCOPY  2014   benign polyps   COLONOSCOPY WITH PROPOFOL  N/A 03/10/2020  Procedure: COLONOSCOPY WITH PROPOFOL ;  Surgeon: Jinny Carmine, MD;  Location: [facility] END[facility];  Service: Endoscopy;  Laterality: N/A;  PRIORITY 4   COLONOSCOPY WITH PROPOFOL  N/A 04/26/2022   Procedure: COLONOSCOPY WITH PROPOFOL ;  Surgeon: Jinny Carmine, MD;  Location: [facility] ENDOSCOPY;  Service: Endoscopy;  Laterality: N/A;   CYSTOSCOPY     ESOPHAGEAL MANOMETRY N/A  08/03/2016   Procedure: ESOPHAGEAL MANOMETRY (EM);  Surgeon: Gustav LULLA Mcgee, MD;  Location: [facility] ENDOSCOPY;  Service: Endoscopy;  Laterality: N/A;   ESOPHAGOGASTRODUODENOSCOPY     ESOPHAGOGASTRODUODENOSCOPY N/A 04/26/2022   Procedure: ESOPHAGOGASTRODUODENOSCOPY (EGD);  Surgeon: Jinny Carmine, MD;  Location: [facility] ENDOSCOPY;  Service: Endoscopy;  Laterality: N/A;   ESOPHAGOGASTRODUODENOSCOPY (EGD) WITH PROPOFOL  N/A 02/17/2016   with esoph dilatation   ESOPHAGOGASTRODUODENOSCOPY (EGD) WITH PROPOFOL  N/A 03/10/2020   Procedure: ESOPHAGOGASTRODUODENOSCOPY (EGD) WITH PROPOFOL ;  Surgeon: Jinny Carmine, MD;  Location: [facility] ENDOSCOPY;  Service: Endoscopy;  Laterality: N/A;   TONSILLECTOMY      Home Medications:  Allergies as of 04/18/2024       Reactions   Prazosin    Other reaction(s): Dizziness        Medication List        Accurate as of April 18, 2024 10:27 AM. If you have any questions, ask your nurse or doctor.          CALCIUM  1200 PO Take 1 tablet by mouth daily.   cyanocobalamin  1000 MCG tablet Take by mouth.   DAILY HERBS PROSTATE PO Take 1 tablet by mouth daily.   finasteride  5 MG tablet Commonly known as: PROSCAR  Take 1 tablet (5 mg total) by mouth daily.   glucosamine-chondroitin 500-400 MG tablet Take 1 tablet by mouth daily.   naproxen 500 MG tablet Commonly known as: NAPROSYN Take 500 mg by mouth 2 (two) times daily as needed for mild pain or moderate pain.   omeprazole 40 MG capsule Commonly known as: PRILOSEC Take 40 mg by mouth daily.   tamsulosin  0.4 MG Caps capsule Commonly known as: FLOMAX  Take 0.4 mg by mouth as needed. What changed: Another medication with the same name was added. Make sure you understand how and when to take each. Changed by: CLOTILDA CORNWALL   tamsulosin  0.4 MG Caps capsule Commonly known as: FLOMAX  Take 1 capsule (0.4 mg total) by mouth daily. What changed: You were already taking a medication with the same name, and this  prescription was added. Make sure you understand how and when to take each. Changed by: Jacion Dismore   traZODone  100 MG tablet Commonly known as: DESYREL  TAKE TWO TABLETS BY MOUTH AT BEDTIME FOR SLEEP. (YOU MAY TAKE A 3RD TABLET IF NEEDED, IF YOU WAKE UP DURING THE NIGHT)   ZINC PO Take by mouth daily.        Allergies:  Allergies  Allergen Reactions   Prazosin     Other reaction(s): Dizziness    Family History: Family History  Problem Relation Age of Onset   Hypertension Mother    Cancer Mother        breast   Aneurysm Mother    Healthy Sister    Prostate cancer Neg Hx    Kidney cancer Neg Hx    Bladder Cancer Neg Hx     Social History:  reports that he quit smoking about 38 years ago. His smoking use included cigarettes, pipe, and cigars. He started smoking about 58 years ago. He has a 10 pack-year smoking history. He has never used smokeless tobacco. He reports  current alcohol use of about 6.0 standard drinks of alcohol per week. He reports that he does not use drugs.  ROS: Pertinent ROS in HPI  Physical Exam: BP 126/69 (BP Location: Left Arm, Patient Position: Sitting, Cuff Size: Normal)   Pulse 74   Ht 5' 10.5 (1.791 m)   Wt 163 lb 3.2 oz (74 kg)   BMI 23.09 kg/m   Constitutional:  Well nourished. Alert and oriented, No acute distress. HEENT:  AT, moist mucus membranes.  Trachea midline Cardiovascular: No clubbing, cyanosis, or edema. Respiratory: Normal respiratory effort, no increased work of breathing. Neurologic: Grossly intact, no focal deficits, moving all 4 extremities. Psychiatric: Normal mood and affect.   Laboratory Data: See EPIC and HPI  I have reviewed the labs.  See HPI.      Pertinent Imaging:  04/18/24 10:02  Scan Result     Assessment & Plan:    1. BPH w/ LU TS -most bothersome symptoms are nocturia -continue conservative management, avoiding bladder irritants and timed voiding's -Recommend to restart the  tamsulosin  -He is not ready yet to pursue a bladder procedure at this time  2. High risk hematuria -Former smoker -Work-up in 2021 -negative for malignant findings -CTU (06/2022) - prostatomegaly and bladder diverticula  -No reports of gross heme -UA w/ micro heme  -Urine cultures pending, but I encouraged him to undergo cystoscopy if urine culture is negative, but he is deferred - I explained that the only way to evaluate any cancer as a cause of his persistent micro heme is a cysto and he understands this   3. Nocturia - He continues to have an increase of nocturia over the last week with some burning in the tip of his penis, so I will send the urine for culture to rule out infection - We will contact him with those results next week   Return for Follow up pending labs.  These notes generated with voice recognition software. I apologize for typographical errors.  CLOTILDA HELON RIGGERS  [facility] [address]  [address] [place], [place] [address] [phone]

## 2024-04-18 ENCOUNTER — Encounter: Payer: Self-pay | Admitting: Urology

## 2024-04-18 ENCOUNTER — Ambulatory Visit: Admitting: Urology

## 2024-04-18 VITALS — BP 126/69 | HR 74 | Ht 70.5 in | Wt 163.2 lb

## 2024-04-18 DIAGNOSIS — N138 Other obstructive and reflux uropathy: Secondary | ICD-10-CM

## 2024-04-18 DIAGNOSIS — R319 Hematuria, unspecified: Secondary | ICD-10-CM | POA: Diagnosis not present

## 2024-04-18 DIAGNOSIS — N401 Enlarged prostate with lower urinary tract symptoms: Secondary | ICD-10-CM

## 2024-04-18 DIAGNOSIS — R351 Nocturia: Secondary | ICD-10-CM

## 2024-04-18 LAB — MICROSCOPIC EXAMINATION

## 2024-04-18 LAB — URINALYSIS, COMPLETE
Bilirubin, UA: NEGATIVE
Glucose, UA: NEGATIVE
Ketones, UA: NEGATIVE
Nitrite, UA: NEGATIVE
Protein,UA: NEGATIVE
Specific Gravity, UA: 1.01 (ref 1.005–1.030)
Urobilinogen, Ur: 0.2 mg/dL (ref 0.2–1.0)
pH, UA: 6 (ref 5.0–7.5)

## 2024-04-18 LAB — BLADDER SCAN AMB NON-IMAGING

## 2024-04-18 MED ORDER — TAMSULOSIN HCL 0.4 MG PO CAPS: 0.4000 mg | ORAL_CAPSULE | Freq: Every day | ORAL | 3 refills | Status: AC

## 2024-04-22 ENCOUNTER — Ambulatory Visit

## 2024-04-23 ENCOUNTER — Ambulatory Visit: Payer: Self-pay | Admitting: Urology

## 2024-04-23 LAB — CULTURE, URINE COMPREHENSIVE

## 2024-04-23 NOTE — Progress Notes (Signed)
 Called patient and talked to patient. Patient understood

## 2024-04-24 ENCOUNTER — Ambulatory Visit

## 2024-04-29 ENCOUNTER — Encounter
Admission: RE | Disposition: A | Discharge status: Home / Self Care | Payer: Self-pay | Source: Home / Self Care | Attending: Gastroenterology

## 2024-04-29 ENCOUNTER — Ambulatory Visit: Admitting: Anesthesiology

## 2024-04-29 ENCOUNTER — Encounter: Payer: Self-pay | Admitting: Gastroenterology

## 2024-04-29 ENCOUNTER — Ambulatory Visit

## 2024-04-29 ENCOUNTER — Ambulatory Visit
Admission: RE | Admit: 2024-04-29 | Discharge: 2024-04-29 | Disposition: A | Discharge status: Home / Self Care | Attending: Gastroenterology | Admitting: Gastroenterology

## 2024-04-29 DIAGNOSIS — K222 Esophageal obstruction: Secondary | ICD-10-CM | POA: Insufficient documentation

## 2024-04-29 DIAGNOSIS — K31A0 Gastric intestinal metaplasia, unspecified: Secondary | ICD-10-CM | POA: Insufficient documentation

## 2024-04-29 DIAGNOSIS — Z79899 Other long term (current) drug therapy: Secondary | ICD-10-CM | POA: Diagnosis not present

## 2024-04-29 DIAGNOSIS — K2289 Other specified disease of esophagus: Secondary | ICD-10-CM | POA: Diagnosis not present

## 2024-04-29 DIAGNOSIS — Z87891 Personal history of nicotine dependence: Secondary | ICD-10-CM | POA: Insufficient documentation

## 2024-04-29 DIAGNOSIS — J449 Chronic obstructive pulmonary disease, unspecified: Secondary | ICD-10-CM | POA: Insufficient documentation

## 2024-04-29 DIAGNOSIS — K219 Gastro-esophageal reflux disease without esophagitis: Secondary | ICD-10-CM | POA: Insufficient documentation

## 2024-04-29 HISTORY — PX: ESOPHAGOGASTRODUODENOSCOPY: SHX5428

## 2024-04-29 SURGERY — EGD (ESOPHAGOGASTRODUODENOSCOPY)
Anesthesia: General

## 2024-04-29 MED ORDER — LIDOCAINE HCL (PF) 2 % IJ SOLN
INTRAMUSCULAR | Status: DC | PRN
Start: 2024-04-29 — End: 2024-04-29
  Administered 2024-04-29: 40 mg via INTRADERMAL

## 2024-04-29 MED ORDER — SODIUM CHLORIDE 0.9 % IV SOLN
INTRAVENOUS | Status: DC
  Administered 2024-04-29: 500 mL via INTRAVENOUS

## 2024-04-29 MED ORDER — PROPOFOL 10 MG/ML IV BOLUS
INTRAVENOUS | Status: DC | PRN
  Administered 2024-04-29: 50 mg via INTRAVENOUS
  Administered 2024-04-29 (×2): 20 mg via INTRAVENOUS

## 2024-04-29 NOTE — H&P (Signed)
 Timothy Copping, MD [facility] [address], [address] [place], [place] [address] [phone] Fax : [phone]  Primary Care Physician:  Fernande Ophelia JINNY DOUGLAS, MD Primary Gastroenterologist:  Dr. Copping  Pre-Procedure History & Physical: HPI:  Timothy Scott is a 78 y.o. male is here for an endoscopy.   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    Lattice degeneration of peripheral retina    Leg varices 03/10/2015   Osteoarthrosis, unspecified whether generalized or localized, hand    Polyp of transverse colon    Posttraumatic stress disorder    Sinus bradycardia 04/17/2014   Varicose veins of bilateral lower extremities with other complications    Wears dentures    partial upper and lower    Past Surgical History:  Procedure Laterality Date   CATARACT EXTRACTION Right 2015   CATARACT EXTRACTION W/PHACO Left 10/21/2015   Procedure: CATARACT EXTRACTION PHACO AND INTRAOCULAR LENS PLACEMENT (IOC);  Surgeon: Dene Etienne, MD;  Location: [facility];  Service: Ophthalmology;  Laterality: Left;   CATARACT EXTRACTION, BILATERAL  2016   COLONOSCOPY  2014   benign polyps   COLONOSCOPY WITH PROPOFOL  N/A 03/10/2020   Procedure: COLONOSCOPY WITH PROPOFOL ;  Surgeon: Scott Rogelia, MD;  Location: [facility] ENDOSCOPY;  Service: Endoscopy;  Laterality: N/A;  PRIORITY 4   COLONOSCOPY WITH PROPOFOL  N/A 04/26/2022   Procedure: COLONOSCOPY WITH PROPOFOL ;  Surgeon: Scott Rogelia, MD;  Location: [facility] ENDOSCOPY;  Service: Endoscopy;  Laterality: N/A;   CYSTOSCOPY     ESOPHAGEAL MANOMETRY N/A 08/03/2016   Procedure: ESOPHAGEAL MANOMETRY (EM);  Surgeon: Gustav LULLA Mcgee, MD;  Location: [facility];  Service:  Endoscopy;  Laterality: N/A;   ESOPHAGOGASTRODUODENOSCOPY     ESOPHAGOGASTRODUODENOSCOPY N/A 04/26/2022   Procedure: ESOPHAGOGASTRODUODENOSCOPY (EGD);  Surgeon: Scott Rogelia, MD;  Location: [facility] ENDOSCOPY;  Service: Endoscopy;  Laterality: N/A;   ESOPHAGOGASTRODUODENOSCOPY (EGD) WITH PROPOFOL  N/A 02/17/2016   with esoph dilatation   ESOPHAGOGASTRODUODENOSCOPY (EGD) WITH PROPOFOL  N/A 03/10/2020   Procedure: ESOPHAGOGASTRODUODENOSCOPY (EGD) WITH PROPOFOL ;  Surgeon: Scott Rogelia, MD;  Location: [facility] ENDOSCOPY;  Service: Endoscopy;  Laterality: N/A;   EYE SURGERY     TONSILLECTOMY      Prior to Admission medications   Medication Sig Start Date End Date Taking? Authorizing Provider  Calcium  Carbonate-Vit D-Min (CALCIUM  1200 PO) Take 1 tablet by mouth daily.    Yes [provider]  cyanocobalamin  1000 MCG tablet Take by mouth.   Yes [provider]  finasteride  (PROSCAR ) 5 MG tablet Take 1 tablet (5 mg total) by mouth daily. 12/27/23  Yes McGowan, Clotilda LABOR, PA-C  glucosamine-chondroitin 500-400 MG tablet Take 1 tablet by mouth daily.   Yes [provider]  Misc Natural Products (DAILY HERBS PROSTATE PO) Take 1 tablet by mouth daily.   Yes [provider]  Multiple Vitamins-Minerals (ZINC PO) Take by mouth daily.   Yes [provider]  naproxen (NAPROSYN) 500 MG tablet Take 500 mg by mouth 2 (two) times daily as needed for mild pain or moderate pain.    Yes [provider]  tamsulosin  (FLOMAX ) 0.4 MG CAPS capsule Take 0.4 mg by mouth as needed.   Yes [provider]  tamsulosin  (  FLOMAX ) 0.4 MG CAPS capsule Take 1 capsule (0.4 mg total) by mouth daily. 04/18/24  Yes McGowan, Clotilda A, PA-C  traZODone  (DESYREL ) 100 MG tablet TAKE TWO TABLETS BY MOUTH AT BEDTIME FOR SLEEP. (YOU MAY TAKE A 3RD TABLET IF NEEDED, IF YOU WAKE UP DURING THE NIGHT) 03/12/21  Yes [provider]  omeprazole (PRILOSEC) 40 MG capsule Take 40 mg by mouth  daily.  Patient not taking: Reported on 04/29/2024 09/27/18   [provider]    Allergies as of 03/14/2024 - Review Complete 03/11/2024  Allergen Reaction Noted   Prazosin  07/28/2014    Family History  Problem Relation Age of Onset   Hypertension Mother    Cancer Mother        breast   Aneurysm Mother    Healthy Sister    Prostate cancer Neg Hx    Kidney cancer Neg Hx    Bladder Cancer Neg Hx     Social History   Socioeconomic History   Marital status: Divorced    Spouse name: Not on file   Number of children: 1   Years of education: some college   Highest education level: 12th grade  Occupational History   Occupation: Retired  Tobacco Use   Smoking status: Former    Current packs/day: 0.00    Average packs/day: 0.5 packs/day for 20.0 years (10.0 ttl pk-yrs)    Types: Cigarettes, Pipe, Cigars    Start date: 04/05/1966    Quit date: 04/05/1986    Years since quitting: 38.0   Smokeless tobacco: Never   Tobacco comments:    smoking cessation materials not required  Vaping Use   Vaping status: Never Used  Substance and Sexual Activity   Alcohol use: Yes    Alcohol/week: 6.0 standard drinks of alcohol    Types: 6 Standard drinks or equivalent per week    Comment: socially   Drug use: No   Sexual activity: Not Currently  Other Topics Concern   Not on file  Social History Narrative   Pt lives alone   Social Drivers of Health   Financial Resource Strain: Low Risk  (04/12/2024)   Received from [facility]   Overall Financial Resource Strain (CARDIA)    Difficulty of Paying Living Expenses: Not hard at all  Food Insecurity: No Food Insecurity (04/12/2024)   [facility]   Hunger Vital Sign    Within the past 12 months, you worried that your food would run out before you got the money to buy more.: Never true    Within the past 12 months, the food you bought just didn't last and you didn't have money to get  more.: Never true  Transportation Needs: No Transportation Needs (04/12/2024)   Received from [facility] - Transportation    In the past 12 months, has lack of transportation kept you from medical appointments or from getting medications?: No    Lack of Transportation (Non-Medical): No  Physical Activity: Inactive (03/23/2023)   Exercise Vital Sign    Days of Exercise per Week: 0 days    Minutes of Exercise per Session: 0 min  Stress: No Stress Concern Present (03/23/2023)   [employer]ational [employer] - Occupational Stress Questionnaire    Feeling of Stress : Only a little  Social Connections: Socially Isolated (03/23/2023)   Social Connection and Isolation Panel    Frequency of Communication with Friends and Family: More  than three times a week    Frequency of Social Gatherings with Friends and Family: Three times a week    Attends Religious Services: Never    Active Member of Clubs or Organizations: No    Attends Banker Meetings: Never    Marital Status: Divorced  Catering manager Violence: Not At Risk (03/23/2023)   Humiliation, Afraid, Rape, and Kick questionnaire    Fear of Current or Ex-Partner: No    Emotionally Abused: No    Physically Abused: No    Sexually Abused: No    Review of Systems: See HPI, otherwise negative ROS  Physical Exam: BP (!) 143/71   Pulse (!) 51   Temp 98 F (36.7 C) (Temporal)   Resp 18   Ht 5' 11 (1.803 m)   Wt 74.8 kg   SpO2 100%   BMI 22.98 kg/m  General:   Alert,  pleasant and cooperative in NAD Head:  Normocephalic and atraumatic. Neck:  Supple; no masses or thyromegaly. Lungs:  Clear throughout to auscultation.    Heart:  Regular rate and rhythm. Abdomen:  Soft, nontender and nondistended. Normal bowel sounds, without guarding, and without rebound.   Neurologic:  Alert and  oriented x4;  grossly normal neurologically.  Impression/Plan: Timothy Scott is here for an endoscopy to  be performed for gastric intestinal metaplasia  Risks, benefits, limitations, and alternatives regarding  endoscopy have been reviewed with the patient.  Questions have been answered.  All parties agreeable.   Timothy Copping, MD  04/29/2024, 8:18 AM

## 2024-04-29 NOTE — Anesthesia Preprocedure Evaluation (Signed)
 Anesthesia Evaluation  Patient identified by MRN, date of birth, ID band Patient awake    Reviewed: Allergy & Precautions, NPO status , Patient's Chart, lab work & pertinent test results  Airway Mallampati: II  TM Distance: >3 FB Neck ROM: full    Dental  (+) Partial Upper, Partial Lower   Pulmonary neg pulmonary ROS, COPD, former smoker   Pulmonary exam normal breath sounds clear to auscultation       Cardiovascular Exercise Tolerance: Good negative cardio ROS Normal cardiovascular exam Rhythm:Regular Rate:Normal     Neuro/Psych  Headaches  Anxiety     negative neurological ROS  negative psych ROS   GI/Hepatic negative GI ROS, Neg liver ROS,GERD  Medicated,,  Endo/Other  negative endocrine ROS    Renal/GU negative Renal ROS  negative genitourinary   Musculoskeletal   Abdominal Normal abdominal exam  (+)   Peds negative pediatric ROS (+)  Hematology negative hematology ROS (+)   Anesthesia Other Findings Past Medical History: No date: Benign neoplasm of colon No date: Chronic airway obstruction, not elsewhere classified No date: DDD (degenerative disc disease), cervical No date: Degeneration of cervical intervertebral disc No date: Dysphagia No date: Dysuria No date: Hyperlipidemia No date: 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 No date: Lattice degeneration of peripheral retina 03/10/2015: Leg varices No date: Osteoarthrosis, unspecified whether generalized or localized,  hand No date: Polyp of transverse colon No date: Posttraumatic stress disorder 04/17/2014: Sinus bradycardia No date: Varicose veins of bilateral lower extremities with other  complications No date: Wears dentures     Comment:  partial upper and lower  Past Surgical History: 2015: CATARACT EXTRACTION; Right 10/21/2015: CATARACT EXTRACTION W/PHACO; Left     Comment:  Procedure: CATARACT EXTRACTION PHACO AND  INTRAOCULAR               LENS PLACEMENT (IOC);  Surgeon: Dene Etienne, MD;               Location: [facility];  Service: Ophthalmology;                Laterality: Left; 2016: CATARACT EXTRACTION, BILATERAL 2014: COLONOSCOPY     Comment:  benign polyps 03/10/2020: COLONOSCOPY WITH PROPOFOL ; N/A     Comment:  Procedure: COLONOSCOPY WITH PROPOFOL ;  Surgeon: Jinny Carmine, MD;  Location: [facility] END[facility]COPY;  Service:               Endoscopy;  Laterality: N/A;  PRIORITY 4 04/26/2022: COLONOSCOPY WITH PROPOFOL ; N/A     Comment:  Procedure: COLONOSCOPY WITH PROPOFOL ;  Surgeon: Jinny Carmine, MD;  Location: [facility] ENDOSCOPY;  Service:               Endoscopy;  Laterality: N/A; No date: CYSTOSCOPY 08/03/2016: ESOPHAGEAL MANOMETRY; N/A     Comment:  Procedure: ESOPHAGEAL MANOMETRY (EM);  Surgeon: Gustav LULLA Mcgee, MD;  Location: [facility];  Service:               Endoscopy;  Laterality: N/A; No date: ESOPHAGOGASTRODUODENOSCOPY 04/26/2022: ESOPHAGOGASTRODUODENOSCOPY; N/A     Comment:  Procedure: ESOPHAGOGASTRODUODENOSCOPY (EGD);  Surgeon:               Jinny Carmine, MD;  Location: [facility];  Service:  Endoscopy;  Laterality: N/A; 02/17/2016: ESOPHAGOGASTRODUODENOSCOPY (EGD) WITH PROPOFOL ; N/A     Comment:  with esoph dilatation 03/10/2020: ESOPHAGOGASTRODUODENOSCOPY (EGD) WITH PROPOFOL ; N/A     Comment:  Procedure: ESOPHAGOGASTRODUODENOSCOPY (EGD) WITH               PROPOFOL ;  Surgeon: Jinny Carmine, MD;  Location: [facility]               ENDOSCOPY;  Service: Endoscopy;  Laterality: N/A; No date: TONSILLECTOMY  BMI    Body Mass Index: 22.98 kg/m      Reproductive/Obstetrics negative OB ROS                              Anesthesia Physical Anesthesia Plan  ASA: 3  Anesthesia Plan: General   Post-op Pain Management:    Induction: Intravenous  PONV Risk Score and Plan: Propofol  infusion and  TIVA  Airway Management Planned: Natural Airway and Nasal Cannula  Additional Equipment:   Intra-op Plan:   Post-operative Plan:   Informed Consent: I have reviewed the patients History and Physical, chart, labs and discussed the procedure including the risks, benefits and alternatives for the proposed anesthesia with the patient or authorized representative who has indicated his/her understanding and acceptance.     Dental Advisory Given  Plan Discussed with: CRNA  Anesthesia Plan Comments:         Anesthesia Quick Evaluation

## 2024-04-29 NOTE — Anesthesia Postprocedure Evaluation (Signed)
 Anesthesia Post Note  Patient: Timothy Scott  Procedure(s) Performed: EGD (ESOPHAGOGASTRODUODENOSCOPY)  Patient location during evaluation: PACU Anesthesia Type: General Level of consciousness: awake and awake and alert Pain management: satisfactory to patient Vital Signs Assessment: post-procedure vital signs reviewed and stable Respiratory status: spontaneous breathing Cardiovascular status: stable Anesthetic complications: no   No notable events documented.   Last Vitals:  Vitals:   04/29/24 0850 04/29/24 0859  BP: 122/73 134/77  Pulse: (!) 57 (!) 54  Resp: 13 15  Temp:    SpO2: 98% 98%    Last Pain:  Vitals:   04/29/24 0801  TempSrc: Temporal  PainSc: 0-No pain                 VAN STAVEREN,Timothy Scott

## 2024-04-29 NOTE — Op Note (Signed)
 [facility]astroenterology Patient Name: Timothy Scott Procedure Date: 04/29/2024 8:16 AM MRN: [id number] Account #: [id number] Date of Birth: 25-Apr-1946 Admit Type: Outpatient Age: 78 Room: [facility] ENDO ROOM 4 Gender: Male Note Status: Finalized Instrument Name: Upper GI Scope [phone] Procedure:             Upper GI endoscopy Indications:           Intestinal metaplasia Providers:             Rogelia Copping MD, MD Medicines:             Propofol  per Anesthesia Complications:         No immediate complications. Procedure:             Pre-Anesthesia Assessment:                        - Prior to the procedure, a History and Physical was                         performed, and patient medications and allergies were                         reviewed. The patient's tolerance of previous                         anesthesia was also reviewed. The risks and benefits                         of the procedure and the sedation options and risks                         were discussed with the patient. All questions were                         answered, and informed consent was obtained. Prior                         Anticoagulants: The patient has taken no anticoagulant                         or antiplatelet agents. ASA Grade Assessment: II - A                         patient with mild systemic disease. After reviewing                         the risks and benefits, the patient was deemed in                         satisfactory condition to undergo the procedure.                        After obtaining informed consent, the endoscope was                         passed under direct vision. Throughout the procedure,                         the patient's  blood pressure, pulse, and oxygen                         saturations were monitored continuously. The Endoscope                         was introduced through the mouth, and advanced to the                         second part of  duodenum. The upper GI endoscopy was                         accomplished without difficulty. The patient tolerated                         the procedure well. Findings:      The examined esophagus was normal. A TTS dilator was passed through the       scope. Dilation with a 15-16.5-18 mm balloon dilator was performed to 18       mm. The dilation site was examined following endoscope reinsertion and       showed complete resolution of luminal narrowing.      The entire examined stomach was normal. Biopsies were taken with a cold       forceps for histology.      The examined duodenum was normal. Impression:            - Normal esophagus. Dilated.                        - Normal stomach. Biopsied for mapping down.                        - Normal examined duodenum. Recommendation:        - Discharge patient to home.                        - Resume previous diet.                        - Continue present medications.                        - Await pathology results.                        - Repeat upper endoscopy in 3 years for surveillance. Procedure Code(s):     --- Professional ---                        [phone], Esophagogastroduodenoscopy, flexible,                         transoral; with transendoscopic balloon dilation of                         esophagus (less than 30 mm diameter)                        43239, 59, Esophagogastroduodenoscopy, flexible,  transoral; with biopsy, single or multiple Diagnosis Code(s):     --- Professional ---                        K31.A0, Gastric intestinal metaplasia, unspecified CPT copyright 2022 [employer]. All rights reserved. The codes documented in this report are preliminary and upon coder review may  be revised to meet current compliance requirements. Rogelia Copping MD, MD 04/29/2024 8:40:57 AM This report has been signed electronically. Number of Addenda: 0 Note Initiated On: 04/29/2024 8:16 AM Estimated  Blood Loss:  Estimated blood loss: none.      [facility]

## 2024-04-29 NOTE — Transfer of Care (Signed)
 Immediate Anesthesia Transfer of Care Note  Patient: Timothy Scott  Procedure(s) Performed: EGD (ESOPHAGOGASTRODUODENOSCOPY)  Patient Location: PACU  Anesthesia Type:MAC  Level of Consciousness: drowsy  Airway & Oxygen Therapy: Patient Spontanous Breathing and Patient connected to face mask oxygen  Post-op Assessment: Report given to RN and Post -op Vital signs reviewed and stable  Post vital signs: Reviewed and stable  Last Vitals:  Vitals Value Taken Time  BP 118/70   Temp    Pulse 55   Resp 18   SpO2 100     Last Pain:  Vitals:   04/29/24 0801  TempSrc: Temporal  PainSc: 0-No pain         Complications: No notable events documented.

## 2024-04-30 LAB — SURGICAL PATHOLOGY

## 2024-05-01 ENCOUNTER — Ambulatory Visit

## 2024-05-08 ENCOUNTER — Ambulatory Visit

## 2024-05-08 DIAGNOSIS — D0439 Carcinoma in situ of skin of other parts of face: Secondary | ICD-10-CM | POA: Diagnosis not present

## 2024-05-08 DIAGNOSIS — D225 Melanocytic nevi of trunk: Secondary | ICD-10-CM | POA: Diagnosis not present

## 2024-05-08 DIAGNOSIS — D2262 Melanocytic nevi of left upper limb, including shoulder: Secondary | ICD-10-CM | POA: Diagnosis not present

## 2024-05-08 DIAGNOSIS — D2261 Melanocytic nevi of right upper limb, including shoulder: Secondary | ICD-10-CM | POA: Diagnosis not present

## 2024-05-08 DIAGNOSIS — L821 Other seborrheic keratosis: Secondary | ICD-10-CM | POA: Diagnosis not present

## 2024-05-08 DIAGNOSIS — D485 Neoplasm of uncertain behavior of skin: Secondary | ICD-10-CM | POA: Diagnosis not present

## 2024-05-08 DIAGNOSIS — D2272 Melanocytic nevi of left lower limb, including hip: Secondary | ICD-10-CM | POA: Diagnosis not present

## 2024-05-08 DIAGNOSIS — D2271 Melanocytic nevi of right lower limb, including hip: Secondary | ICD-10-CM | POA: Diagnosis not present

## 2024-05-13 ENCOUNTER — Ambulatory Visit

## 2024-05-15 ENCOUNTER — Ambulatory Visit

## 2024-05-16 ENCOUNTER — Ambulatory Visit: Payer: Self-pay | Admitting: Gastroenterology

## 2024-05-21 ENCOUNTER — Other Ambulatory Visit: Admitting: Urology

## 2024-05-22 ENCOUNTER — Ambulatory Visit

## 2024-05-29 ENCOUNTER — Ambulatory Visit

## 2024-05-31 ENCOUNTER — Encounter: Payer: Self-pay | Admitting: Internal Medicine

## 2024-05-31 DIAGNOSIS — D0439 Carcinoma in situ of skin of other parts of face: Secondary | ICD-10-CM | POA: Diagnosis not present

## 2024-12-26 ENCOUNTER — Ambulatory Visit: Admitting: Urology
